# Patient Record
Sex: Female | Born: 1945 | Race: White | Hispanic: No | Smoking: Never smoker
Health system: Southern US, Community
[De-identification: ages and names within clinical notes are randomized; demographics above are authoritative.]

## PROBLEM LIST (undated history)

## (undated) DIAGNOSIS — K449 Diaphragmatic hernia without obstruction or gangrene: Secondary | ICD-10-CM

## (undated) DIAGNOSIS — I10 Essential (primary) hypertension: Secondary | ICD-10-CM

## (undated) DIAGNOSIS — R110 Nausea: Secondary | ICD-10-CM

## (undated) DIAGNOSIS — G629 Polyneuropathy, unspecified: Secondary | ICD-10-CM

## (undated) DIAGNOSIS — R51 Headache: Secondary | ICD-10-CM

## (undated) DIAGNOSIS — L0201 Cutaneous abscess of face: Secondary | ICD-10-CM

## (undated) DIAGNOSIS — K7689 Other specified diseases of liver: Secondary | ICD-10-CM

## (undated) DIAGNOSIS — F319 Bipolar disorder, unspecified: Secondary | ICD-10-CM

## (undated) DIAGNOSIS — F32A Depression, unspecified: Secondary | ICD-10-CM

## (undated) DIAGNOSIS — R519 Headache, unspecified: Secondary | ICD-10-CM

## (undated) DIAGNOSIS — Z800 Family history of malignant neoplasm of digestive organs: Secondary | ICD-10-CM

## (undated) DIAGNOSIS — G4761 Periodic limb movement disorder: Secondary | ICD-10-CM

## (undated) DIAGNOSIS — G8929 Other chronic pain: Secondary | ICD-10-CM

## (undated) DIAGNOSIS — F329 Major depressive disorder, single episode, unspecified: Secondary | ICD-10-CM

## (undated) DIAGNOSIS — R160 Hepatomegaly, not elsewhere classified: Secondary | ICD-10-CM

## (undated) DIAGNOSIS — E785 Hyperlipidemia, unspecified: Secondary | ICD-10-CM

## (undated) DIAGNOSIS — K219 Gastro-esophageal reflux disease without esophagitis: Secondary | ICD-10-CM

## (undated) DIAGNOSIS — F0390 Unspecified dementia without behavioral disturbance: Secondary | ICD-10-CM

## (undated) DIAGNOSIS — I517 Cardiomegaly: Secondary | ICD-10-CM

## (undated) DIAGNOSIS — K589 Irritable bowel syndrome without diarrhea: Secondary | ICD-10-CM

## (undated) DIAGNOSIS — K5730 Diverticulosis of large intestine without perforation or abscess without bleeding: Secondary | ICD-10-CM

## (undated) DIAGNOSIS — J449 Chronic obstructive pulmonary disease, unspecified: Secondary | ICD-10-CM

## (undated) DIAGNOSIS — M109 Gout, unspecified: Secondary | ICD-10-CM

## (undated) HISTORY — DX: Family history of malignant neoplasm of digestive organs: Z80.0

## (undated) HISTORY — PX: CHOLECYSTECTOMY: SHX55

## (undated) HISTORY — DX: Other specified diseases of liver: K76.89

## (undated) HISTORY — PX: OTHER SURGICAL HISTORY: SHX169

## (undated) HISTORY — DX: Gout, unspecified: M10.9

## (undated) HISTORY — DX: Diaphragmatic hernia without obstruction or gangrene: K44.9

## (undated) HISTORY — DX: Hyperlipidemia, unspecified: E78.5

## (undated) HISTORY — DX: Cardiomegaly: I51.7

## (undated) HISTORY — DX: Hepatomegaly, not elsewhere classified: R16.0

## (undated) HISTORY — DX: Chronic obstructive pulmonary disease, unspecified: J44.9

## (undated) HISTORY — DX: Gastro-esophageal reflux disease without esophagitis: K21.9

## (undated) HISTORY — PX: APPENDECTOMY: SHX54

## (undated) HISTORY — DX: Headache: R51

## (undated) HISTORY — DX: Depression, unspecified: F32.A

## (undated) HISTORY — PX: OVARIAN CYST REMOVAL: SHX89

## (undated) HISTORY — PX: ABDOMINAL HYSTERECTOMY: SHX81

## (undated) HISTORY — DX: Major depressive disorder, single episode, unspecified: F32.9

## (undated) HISTORY — DX: Irritable bowel syndrome, unspecified: K58.9

## (undated) HISTORY — DX: Nausea: R11.0

## (undated) HISTORY — DX: Essential (primary) hypertension: I10

## (undated) HISTORY — DX: Bipolar disorder, unspecified: F31.9

## (undated) HISTORY — DX: Diverticulosis of large intestine without perforation or abscess without bleeding: K57.30

---

## 1997-12-26 ENCOUNTER — Ambulatory Visit (HOSPITAL_COMMUNITY): Admission: RE | Admit: 1997-12-26 | Discharge: 1997-12-26 | Payer: Self-pay | Admitting: Family Medicine

## 1998-10-03 ENCOUNTER — Inpatient Hospital Stay (HOSPITAL_COMMUNITY): Admission: RE | Admit: 1998-10-03 | Discharge: 1998-10-05 | Payer: Self-pay | Admitting: *Deleted

## 2000-01-18 ENCOUNTER — Inpatient Hospital Stay (HOSPITAL_COMMUNITY): Admission: AD | Admit: 2000-01-18 | Discharge: 2000-01-25 | Payer: Self-pay | Admitting: Specialist

## 2000-03-21 ENCOUNTER — Ambulatory Visit (HOSPITAL_COMMUNITY)
Admission: RE | Admit: 2000-03-21 | Discharge: 2000-03-21 | Payer: Self-pay | Admitting: Thoracic Surgery (Cardiothoracic Vascular Surgery)

## 2000-03-21 ENCOUNTER — Encounter: Payer: Self-pay | Admitting: Thoracic Surgery (Cardiothoracic Vascular Surgery)

## 2000-03-25 ENCOUNTER — Encounter (INDEPENDENT_AMBULATORY_CARE_PROVIDER_SITE_OTHER): Payer: Self-pay | Admitting: *Deleted

## 2000-03-25 ENCOUNTER — Encounter: Payer: Self-pay | Admitting: Thoracic Surgery (Cardiothoracic Vascular Surgery)

## 2000-03-25 ENCOUNTER — Ambulatory Visit (HOSPITAL_COMMUNITY)
Admission: RE | Admit: 2000-03-25 | Discharge: 2000-03-25 | Payer: Self-pay | Admitting: Thoracic Surgery (Cardiothoracic Vascular Surgery)

## 2000-10-07 ENCOUNTER — Encounter: Payer: Self-pay | Admitting: Thoracic Surgery (Cardiothoracic Vascular Surgery)

## 2000-10-07 ENCOUNTER — Encounter
Admission: RE | Admit: 2000-10-07 | Discharge: 2000-10-07 | Payer: Self-pay | Admitting: Thoracic Surgery (Cardiothoracic Vascular Surgery)

## 2001-03-25 ENCOUNTER — Other Ambulatory Visit: Admission: RE | Admit: 2001-03-25 | Discharge: 2001-03-25 | Payer: Self-pay | Admitting: Obstetrics and Gynecology

## 2001-07-16 ENCOUNTER — Encounter (INDEPENDENT_AMBULATORY_CARE_PROVIDER_SITE_OTHER): Payer: Self-pay | Admitting: Specialist

## 2001-07-16 ENCOUNTER — Ambulatory Visit (HOSPITAL_COMMUNITY): Admission: RE | Admit: 2001-07-16 | Discharge: 2001-07-16 | Payer: Self-pay | Admitting: Gastroenterology

## 2001-09-15 ENCOUNTER — Encounter: Payer: Self-pay | Admitting: Surgery

## 2001-09-15 ENCOUNTER — Encounter (INDEPENDENT_AMBULATORY_CARE_PROVIDER_SITE_OTHER): Payer: Self-pay | Admitting: Specialist

## 2001-09-15 ENCOUNTER — Observation Stay (HOSPITAL_COMMUNITY): Admission: RE | Admit: 2001-09-15 | Discharge: 2001-09-16 | Payer: Self-pay | Admitting: Surgery

## 2001-10-03 ENCOUNTER — Encounter: Payer: Self-pay | Admitting: Emergency Medicine

## 2001-10-03 ENCOUNTER — Emergency Department (HOSPITAL_COMMUNITY): Admission: EM | Admit: 2001-10-03 | Discharge: 2001-10-03 | Payer: Self-pay | Admitting: Emergency Medicine

## 2002-03-07 ENCOUNTER — Ambulatory Visit (HOSPITAL_BASED_OUTPATIENT_CLINIC_OR_DEPARTMENT_OTHER): Admission: RE | Admit: 2002-03-07 | Discharge: 2002-03-07 | Payer: Self-pay | Admitting: Neurology

## 2002-04-02 ENCOUNTER — Other Ambulatory Visit: Admission: RE | Admit: 2002-04-02 | Discharge: 2002-04-02 | Payer: Self-pay | Admitting: *Deleted

## 2002-05-11 ENCOUNTER — Encounter: Admission: RE | Admit: 2002-05-11 | Discharge: 2002-05-11 | Payer: Self-pay | Admitting: *Deleted

## 2002-05-11 ENCOUNTER — Encounter: Payer: Self-pay | Admitting: *Deleted

## 2003-05-11 ENCOUNTER — Other Ambulatory Visit: Admission: RE | Admit: 2003-05-11 | Discharge: 2003-05-11 | Payer: Self-pay | Admitting: *Deleted

## 2003-05-15 ENCOUNTER — Emergency Department (HOSPITAL_COMMUNITY): Admission: EM | Admit: 2003-05-15 | Discharge: 2003-05-15 | Payer: Self-pay | Admitting: Emergency Medicine

## 2003-05-16 ENCOUNTER — Encounter: Admission: RE | Admit: 2003-05-16 | Discharge: 2003-05-16 | Payer: Self-pay | Admitting: Gastroenterology

## 2003-05-16 ENCOUNTER — Encounter: Payer: Self-pay | Admitting: Gastroenterology

## 2003-07-31 ENCOUNTER — Emergency Department (HOSPITAL_COMMUNITY): Admission: EM | Admit: 2003-07-31 | Discharge: 2003-07-31 | Payer: Self-pay | Admitting: Emergency Medicine

## 2004-06-29 ENCOUNTER — Emergency Department (HOSPITAL_COMMUNITY): Admission: EM | Admit: 2004-06-29 | Discharge: 2004-06-29 | Payer: Self-pay | Admitting: Family Medicine

## 2004-10-21 ENCOUNTER — Emergency Department (HOSPITAL_COMMUNITY): Admission: EM | Admit: 2004-10-21 | Discharge: 2004-10-21 | Payer: Self-pay | Admitting: Family Medicine

## 2004-11-15 ENCOUNTER — Encounter (INDEPENDENT_AMBULATORY_CARE_PROVIDER_SITE_OTHER): Payer: Self-pay | Admitting: *Deleted

## 2004-11-16 ENCOUNTER — Ambulatory Visit: Payer: Self-pay

## 2004-11-26 ENCOUNTER — Encounter (INDEPENDENT_AMBULATORY_CARE_PROVIDER_SITE_OTHER): Payer: Self-pay | Admitting: *Deleted

## 2004-12-17 ENCOUNTER — Emergency Department (HOSPITAL_COMMUNITY): Admission: EM | Admit: 2004-12-17 | Discharge: 2004-12-17 | Payer: Self-pay | Admitting: Emergency Medicine

## 2005-01-17 ENCOUNTER — Ambulatory Visit: Payer: Self-pay | Admitting: Gastroenterology

## 2005-02-13 ENCOUNTER — Encounter (INDEPENDENT_AMBULATORY_CARE_PROVIDER_SITE_OTHER): Payer: Self-pay | Admitting: Specialist

## 2005-02-13 ENCOUNTER — Ambulatory Visit: Payer: Self-pay | Admitting: Gastroenterology

## 2005-03-14 ENCOUNTER — Ambulatory Visit: Payer: Self-pay | Admitting: Gastroenterology

## 2005-05-31 ENCOUNTER — Ambulatory Visit (HOSPITAL_COMMUNITY): Admission: RE | Admit: 2005-05-31 | Discharge: 2005-05-31 | Payer: Self-pay | Admitting: Obstetrics

## 2005-11-09 ENCOUNTER — Emergency Department (HOSPITAL_COMMUNITY): Admission: EM | Admit: 2005-11-09 | Discharge: 2005-11-09 | Payer: Self-pay | Admitting: Family Medicine

## 2006-02-05 ENCOUNTER — Emergency Department (HOSPITAL_COMMUNITY): Admission: EM | Admit: 2006-02-05 | Discharge: 2006-02-06 | Payer: Self-pay | Admitting: Emergency Medicine

## 2006-04-13 ENCOUNTER — Emergency Department (HOSPITAL_COMMUNITY): Admission: EM | Admit: 2006-04-13 | Discharge: 2006-04-13 | Payer: Self-pay | Admitting: Family Medicine

## 2006-05-02 ENCOUNTER — Emergency Department (HOSPITAL_COMMUNITY): Admission: EM | Admit: 2006-05-02 | Discharge: 2006-05-02 | Payer: Self-pay | Admitting: *Deleted

## 2006-05-11 ENCOUNTER — Emergency Department (HOSPITAL_COMMUNITY): Admission: EM | Admit: 2006-05-11 | Discharge: 2006-05-11 | Payer: Self-pay | Admitting: Emergency Medicine

## 2006-07-13 ENCOUNTER — Emergency Department (HOSPITAL_COMMUNITY): Admission: EM | Admit: 2006-07-13 | Discharge: 2006-07-13 | Payer: Self-pay | Admitting: Family Medicine

## 2006-08-15 ENCOUNTER — Encounter
Admission: RE | Admit: 2006-08-15 | Discharge: 2006-08-15 | Payer: Self-pay | Admitting: Thoracic Surgery (Cardiothoracic Vascular Surgery)

## 2006-08-25 ENCOUNTER — Emergency Department (HOSPITAL_COMMUNITY): Admission: EM | Admit: 2006-08-25 | Discharge: 2006-08-25 | Payer: Self-pay | Admitting: Family Medicine

## 2006-09-20 ENCOUNTER — Emergency Department (HOSPITAL_COMMUNITY): Admission: EM | Admit: 2006-09-20 | Discharge: 2006-09-20 | Payer: Self-pay | Admitting: Emergency Medicine

## 2006-09-22 ENCOUNTER — Inpatient Hospital Stay (HOSPITAL_COMMUNITY): Admission: RE | Admit: 2006-09-22 | Discharge: 2006-09-25 | Payer: Self-pay | Admitting: Psychiatry

## 2006-09-22 ENCOUNTER — Ambulatory Visit: Payer: Self-pay | Admitting: Psychiatry

## 2006-09-26 ENCOUNTER — Ambulatory Visit: Payer: Self-pay | Admitting: Gastroenterology

## 2006-10-21 ENCOUNTER — Emergency Department (HOSPITAL_COMMUNITY): Admission: EM | Admit: 2006-10-21 | Discharge: 2006-10-21 | Payer: Self-pay | Admitting: Emergency Medicine

## 2007-01-18 ENCOUNTER — Emergency Department (HOSPITAL_COMMUNITY): Admission: EM | Admit: 2007-01-18 | Discharge: 2007-01-18 | Payer: Self-pay | Admitting: Emergency Medicine

## 2007-02-02 ENCOUNTER — Encounter
Admission: RE | Admit: 2007-02-02 | Discharge: 2007-02-02 | Payer: Self-pay | Admitting: Thoracic Surgery (Cardiothoracic Vascular Surgery)

## 2007-02-02 ENCOUNTER — Ambulatory Visit: Payer: Self-pay | Admitting: Thoracic Surgery (Cardiothoracic Vascular Surgery)

## 2007-02-03 ENCOUNTER — Ambulatory Visit: Payer: Self-pay | Admitting: Gastroenterology

## 2007-03-21 ENCOUNTER — Emergency Department (HOSPITAL_COMMUNITY): Admission: EM | Admit: 2007-03-21 | Discharge: 2007-03-21 | Payer: Self-pay | Admitting: Family Medicine

## 2007-04-09 ENCOUNTER — Ambulatory Visit: Payer: Self-pay | Admitting: Gastroenterology

## 2007-04-20 ENCOUNTER — Emergency Department (HOSPITAL_COMMUNITY): Admission: EM | Admit: 2007-04-20 | Discharge: 2007-04-20 | Payer: Self-pay | Admitting: Emergency Medicine

## 2007-04-21 ENCOUNTER — Encounter: Admission: RE | Admit: 2007-04-21 | Discharge: 2007-04-21 | Payer: Self-pay | Admitting: Family Medicine

## 2007-04-21 ENCOUNTER — Encounter (INDEPENDENT_AMBULATORY_CARE_PROVIDER_SITE_OTHER): Payer: Self-pay | Admitting: *Deleted

## 2007-06-30 ENCOUNTER — Ambulatory Visit: Payer: Self-pay | Admitting: Gastroenterology

## 2007-06-30 LAB — CONVERTED CEMR LAB
ALT: 75 units/L — ABNORMAL HIGH (ref 0–35)
AST: 44 units/L — ABNORMAL HIGH (ref 0–37)
Alkaline Phosphatase: 60 units/L (ref 39–117)
Basophils Absolute: 0 10*3/uL (ref 0.0–0.1)
Basophils Relative: 0.4 % (ref 0.0–1.0)
Bilirubin, Direct: 0.1 mg/dL (ref 0.0–0.3)
CO2: 32 meq/L (ref 19–32)
Eosinophils Absolute: 0.3 10*3/uL (ref 0.0–0.6)
Ferritin: 65.2 ng/mL (ref 10.0–291.0)
Folate: >20 ng/mL
GFR calc non Af Amer: 67 mL/min
HCT: 39.7 % (ref 36.0–46.0)
Hemoglobin: 13.9 g/dL (ref 12.0–15.0)
Iron: 67 ug/dL (ref 42–145)
Monocytes Absolute: 0.6 10*3/uL (ref 0.2–0.7)
Neutro Abs: 4.4 10*3/uL (ref 1.4–7.7)
Platelets: 347 10*3/uL (ref 150–400)
RBC: 4.55 M/uL (ref 3.87–5.11)
RDW: 12.7 % (ref 11.5–14.6)
Saturation Ratios: 17.3 % — ABNORMAL LOW (ref 20.0–50.0)
Total Bilirubin: 0.6 mg/dL (ref 0.3–1.2)
Total Protein: 7.3 g/dL (ref 6.0–8.3)
Transferrin: 276.5 mg/dL (ref >=212.0)
Vitamin B-12: 393 pg/mL (ref 211–911)

## 2007-07-24 ENCOUNTER — Encounter: Payer: Self-pay | Admitting: Gastroenterology

## 2007-08-21 ENCOUNTER — Ambulatory Visit: Payer: Self-pay | Admitting: Gastroenterology

## 2007-10-28 DIAGNOSIS — K589 Irritable bowel syndrome without diarrhea: Secondary | ICD-10-CM | POA: Insufficient documentation

## 2007-10-28 DIAGNOSIS — J4489 Other specified chronic obstructive pulmonary disease: Secondary | ICD-10-CM | POA: Insufficient documentation

## 2007-10-28 DIAGNOSIS — I10 Essential (primary) hypertension: Secondary | ICD-10-CM

## 2007-10-28 DIAGNOSIS — F319 Bipolar disorder, unspecified: Secondary | ICD-10-CM

## 2007-10-28 DIAGNOSIS — Z8679 Personal history of other diseases of the circulatory system: Secondary | ICD-10-CM | POA: Insufficient documentation

## 2007-10-28 DIAGNOSIS — J309 Allergic rhinitis, unspecified: Secondary | ICD-10-CM | POA: Insufficient documentation

## 2007-10-28 DIAGNOSIS — M1990 Unspecified osteoarthritis, unspecified site: Secondary | ICD-10-CM | POA: Insufficient documentation

## 2007-10-28 DIAGNOSIS — K7689 Other specified diseases of liver: Secondary | ICD-10-CM

## 2007-10-28 DIAGNOSIS — J449 Chronic obstructive pulmonary disease, unspecified: Secondary | ICD-10-CM

## 2007-10-28 DIAGNOSIS — R519 Headache, unspecified: Secondary | ICD-10-CM | POA: Insufficient documentation

## 2007-10-28 DIAGNOSIS — E7800 Pure hypercholesterolemia, unspecified: Secondary | ICD-10-CM | POA: Insufficient documentation

## 2007-10-28 DIAGNOSIS — K449 Diaphragmatic hernia without obstruction or gangrene: Secondary | ICD-10-CM | POA: Insufficient documentation

## 2007-10-28 DIAGNOSIS — K5730 Diverticulosis of large intestine without perforation or abscess without bleeding: Secondary | ICD-10-CM | POA: Insufficient documentation

## 2007-10-28 DIAGNOSIS — I517 Cardiomegaly: Secondary | ICD-10-CM

## 2007-10-28 DIAGNOSIS — R51 Headache: Secondary | ICD-10-CM

## 2007-11-28 ENCOUNTER — Emergency Department (HOSPITAL_COMMUNITY): Admission: EM | Admit: 2007-11-28 | Discharge: 2007-11-28 | Payer: Self-pay | Admitting: Emergency Medicine

## 2007-11-29 ENCOUNTER — Emergency Department (HOSPITAL_COMMUNITY): Admission: EM | Admit: 2007-11-29 | Discharge: 2007-11-29 | Payer: Self-pay | Admitting: Emergency Medicine

## 2007-12-22 ENCOUNTER — Ambulatory Visit: Payer: Self-pay | Admitting: Thoracic Surgery (Cardiothoracic Vascular Surgery)

## 2007-12-22 ENCOUNTER — Encounter
Admission: RE | Admit: 2007-12-22 | Discharge: 2007-12-22 | Payer: Self-pay | Admitting: Thoracic Surgery (Cardiothoracic Vascular Surgery)

## 2008-03-08 ENCOUNTER — Encounter: Admission: RE | Admit: 2008-03-08 | Discharge: 2008-03-08 | Payer: Self-pay | Admitting: Family Medicine

## 2008-03-23 ENCOUNTER — Ambulatory Visit: Payer: Self-pay | Admitting: Family Medicine

## 2008-05-02 ENCOUNTER — Ambulatory Visit: Payer: Self-pay | Admitting: Family Medicine

## 2008-05-02 DIAGNOSIS — L408 Other psoriasis: Secondary | ICD-10-CM

## 2008-05-23 ENCOUNTER — Ambulatory Visit: Payer: Self-pay | Admitting: Family Medicine

## 2008-06-24 ENCOUNTER — Ambulatory Visit: Payer: Self-pay | Admitting: Family Medicine

## 2008-08-04 ENCOUNTER — Telehealth: Payer: Self-pay | Admitting: Family Medicine

## 2008-09-17 ENCOUNTER — Ambulatory Visit: Payer: Self-pay | Admitting: Occupational Medicine

## 2008-09-17 DIAGNOSIS — J45909 Unspecified asthma, uncomplicated: Secondary | ICD-10-CM | POA: Insufficient documentation

## 2008-09-18 ENCOUNTER — Encounter: Payer: Self-pay | Admitting: Family Medicine

## 2008-09-20 ENCOUNTER — Encounter: Payer: Self-pay | Admitting: Family Medicine

## 2008-09-23 ENCOUNTER — Ambulatory Visit: Payer: Self-pay | Admitting: Family Medicine

## 2008-10-03 ENCOUNTER — Ambulatory Visit: Payer: Self-pay | Admitting: Family Medicine

## 2008-10-03 DIAGNOSIS — M25476 Effusion, unspecified foot: Secondary | ICD-10-CM

## 2008-10-03 DIAGNOSIS — M25473 Effusion, unspecified ankle: Secondary | ICD-10-CM

## 2008-10-04 ENCOUNTER — Encounter: Payer: Self-pay | Admitting: Family Medicine

## 2008-10-04 LAB — CONVERTED CEMR LAB
Basophils Absolute: 0 10*3/uL (ref 0.0–0.1)
Eosinophils Absolute: 0.4 10*3/uL (ref 0.0–0.7)
HCT: 40.5 % (ref 36.0–46.0)
Lymphocytes Relative: 30 % (ref 12–46)
MCHC: 32.1 g/dL (ref 30.0–36.0)
Monocytes Absolute: 0.7 10*3/uL (ref 0.1–1.0)
Monocytes Relative: 7 % (ref 3–12)
Neutrophils Relative %: 59 % (ref 43–77)
Platelets: 370 10*3/uL (ref 150–400)
RBC: 4.66 M/uL (ref 3.87–5.11)
RDW: 14.7 % (ref 11.5–15.5)
Sed Rate: 33 mm/hr — ABNORMAL HIGH (ref 0–22)
Uric Acid, Serum: 6.1 mg/dL (ref 2.4–7.0)

## 2008-10-13 ENCOUNTER — Ambulatory Visit: Payer: Self-pay | Admitting: Family Medicine

## 2008-10-13 ENCOUNTER — Encounter: Admission: RE | Admit: 2008-10-13 | Discharge: 2008-10-13 | Payer: Self-pay | Admitting: Family Medicine

## 2008-10-20 ENCOUNTER — Encounter: Payer: Self-pay | Admitting: Family Medicine

## 2008-10-24 ENCOUNTER — Telehealth: Payer: Self-pay | Admitting: Family Medicine

## 2008-10-31 ENCOUNTER — Encounter: Payer: Self-pay | Admitting: Family Medicine

## 2008-11-01 ENCOUNTER — Telehealth: Payer: Self-pay | Admitting: Family Medicine

## 2008-11-02 ENCOUNTER — Telehealth: Payer: Self-pay | Admitting: Family Medicine

## 2008-11-04 ENCOUNTER — Telehealth: Payer: Self-pay | Admitting: Family Medicine

## 2008-11-15 ENCOUNTER — Telehealth (INDEPENDENT_AMBULATORY_CARE_PROVIDER_SITE_OTHER): Payer: Self-pay | Admitting: *Deleted

## 2008-11-24 ENCOUNTER — Telehealth: Payer: Self-pay | Admitting: Family Medicine

## 2008-11-25 ENCOUNTER — Telehealth: Payer: Self-pay | Admitting: Family Medicine

## 2008-11-29 ENCOUNTER — Encounter: Payer: Self-pay | Admitting: Family Medicine

## 2008-12-22 ENCOUNTER — Encounter: Payer: Self-pay | Admitting: Family Medicine

## 2009-01-03 ENCOUNTER — Ambulatory Visit: Payer: Self-pay | Admitting: Family Medicine

## 2009-01-03 LAB — CONVERTED CEMR LAB: Hgb A1c MFr Bld: 5.8 %

## 2009-01-04 ENCOUNTER — Encounter: Payer: Self-pay | Admitting: Family Medicine

## 2009-01-05 LAB — CONVERTED CEMR LAB
AST: 31 units/L (ref 0–37)
BUN: 15 mg/dL (ref 6–23)
Basophils Relative: 1 % (ref 0–1)
CO2: 26 meq/L (ref 19–32)
Calcium: 9.7 mg/dL (ref 8.4–10.5)
Chloride: 98 meq/L (ref 96–112)
Eosinophils Relative: 5 % (ref 0–5)
Glucose, Bld: 95 mg/dL (ref 70–99)
HCT: 44.2 % (ref 36.0–46.0)
Lymphs Abs: 2.8 10*3/uL (ref 0.7–4.0)
MCHC: 32.4 g/dL (ref 30.0–36.0)
Monocytes Relative: 6 % (ref 3–12)
Platelets: 320 10*3/uL (ref 150–400)
RDW: 14.9 % (ref 11.5–15.5)
TSH: 2.45 microintl units/mL (ref 0.350–4.500)
Total Bilirubin: 0.4 mg/dL (ref 0.3–1.2)
WBC: 7 10*3/uL (ref 4.0–10.5)

## 2009-01-09 ENCOUNTER — Telehealth: Payer: Self-pay | Admitting: Family Medicine

## 2009-01-10 ENCOUNTER — Telehealth (INDEPENDENT_AMBULATORY_CARE_PROVIDER_SITE_OTHER): Payer: Self-pay | Admitting: *Deleted

## 2009-01-11 ENCOUNTER — Encounter: Payer: Self-pay | Admitting: Family Medicine

## 2009-01-11 LAB — CONVERTED CEMR LAB
AST: 26 units/L (ref 0–37)
Indirect Bilirubin: 0.3 mg/dL (ref 0.0–0.9)
Total Bilirubin: 0.4 mg/dL (ref 0.3–1.2)

## 2009-01-26 ENCOUNTER — Ambulatory Visit: Payer: Self-pay | Admitting: Family Medicine

## 2009-01-26 DIAGNOSIS — G609 Hereditary and idiopathic neuropathy, unspecified: Secondary | ICD-10-CM | POA: Insufficient documentation

## 2009-01-27 ENCOUNTER — Encounter: Payer: Self-pay | Admitting: Family Medicine

## 2009-01-27 LAB — CONVERTED CEMR LAB: Vit D, 25-Hydroxy: 21 ng/mL — ABNORMAL LOW (ref 30–89)

## 2009-02-02 ENCOUNTER — Telehealth: Payer: Self-pay | Admitting: Family Medicine

## 2009-02-20 ENCOUNTER — Ambulatory Visit: Payer: Self-pay | Admitting: Family Medicine

## 2009-02-21 ENCOUNTER — Encounter: Payer: Self-pay | Admitting: Family Medicine

## 2009-02-25 ENCOUNTER — Encounter: Payer: Self-pay | Admitting: Family Medicine

## 2009-02-28 ENCOUNTER — Encounter: Payer: Self-pay | Admitting: Family Medicine

## 2009-02-28 LAB — CONVERTED CEMR LAB
ALT: 49 units/L — ABNORMAL HIGH (ref 0–35)
AST: 35 units/L (ref 0–37)
Alkaline Phosphatase: 58 units/L (ref 39–117)
Amylase: 28 units/L (ref 0–105)
BUN: 19 mg/dL (ref 6–23)
CO2: 24 meq/L (ref 19–32)
Creatinine, Ser: 0.82 mg/dL (ref 0.40–1.20)
Glucose, Bld: 100 mg/dL — ABNORMAL HIGH (ref 70–99)
Potassium: 4.2 meq/L (ref 3.5–5.3)
Sodium: 138 meq/L (ref 135–145)
Total Protein: 7.3 g/dL (ref 6.0–8.3)

## 2009-03-01 ENCOUNTER — Encounter: Admission: RE | Admit: 2009-03-01 | Discharge: 2009-03-01 | Payer: Self-pay | Admitting: Family Medicine

## 2009-03-01 ENCOUNTER — Telehealth: Payer: Self-pay | Admitting: Family Medicine

## 2009-03-01 ENCOUNTER — Encounter: Payer: Self-pay | Admitting: Family Medicine

## 2009-03-01 DIAGNOSIS — R160 Hepatomegaly, not elsewhere classified: Secondary | ICD-10-CM | POA: Insufficient documentation

## 2009-03-01 DIAGNOSIS — K7689 Other specified diseases of liver: Secondary | ICD-10-CM | POA: Insufficient documentation

## 2009-03-01 LAB — CONVERTED CEMR LAB
HCV Ab: NEGATIVE
Hep A IgM: NEGATIVE
Hepatitis B Surface Ag: NEGATIVE

## 2009-03-03 ENCOUNTER — Encounter: Admission: RE | Admit: 2009-03-03 | Discharge: 2009-03-03 | Payer: Self-pay | Admitting: Family Medicine

## 2009-03-03 ENCOUNTER — Telehealth: Payer: Self-pay | Admitting: Family Medicine

## 2009-03-07 ENCOUNTER — Ambulatory Visit: Payer: Self-pay | Admitting: Family Medicine

## 2009-03-23 ENCOUNTER — Telehealth: Payer: Self-pay | Admitting: Family Medicine

## 2009-03-24 ENCOUNTER — Telehealth: Payer: Self-pay | Admitting: Family Medicine

## 2009-03-30 ENCOUNTER — Telehealth (INDEPENDENT_AMBULATORY_CARE_PROVIDER_SITE_OTHER): Payer: Self-pay | Admitting: *Deleted

## 2009-04-03 ENCOUNTER — Ambulatory Visit: Payer: Self-pay | Admitting: Gastroenterology

## 2009-04-07 ENCOUNTER — Telehealth: Payer: Self-pay | Admitting: Family Medicine

## 2009-04-11 ENCOUNTER — Ambulatory Visit: Payer: Self-pay | Admitting: Family Medicine

## 2009-04-12 ENCOUNTER — Encounter: Payer: Self-pay | Admitting: Family Medicine

## 2009-04-12 ENCOUNTER — Telehealth: Payer: Self-pay | Admitting: Family Medicine

## 2009-04-19 ENCOUNTER — Ambulatory Visit (HOSPITAL_COMMUNITY): Admission: RE | Admit: 2009-04-19 | Discharge: 2009-04-19 | Payer: Self-pay | Admitting: Gastroenterology

## 2009-04-20 ENCOUNTER — Telehealth: Payer: Self-pay | Admitting: Gastroenterology

## 2009-04-20 ENCOUNTER — Telehealth (INDEPENDENT_AMBULATORY_CARE_PROVIDER_SITE_OTHER): Payer: Self-pay | Admitting: *Deleted

## 2009-04-21 ENCOUNTER — Telehealth: Payer: Self-pay | Admitting: Gastroenterology

## 2009-04-24 ENCOUNTER — Ambulatory Visit: Payer: Self-pay | Admitting: Gastroenterology

## 2009-04-24 ENCOUNTER — Telehealth: Payer: Self-pay | Admitting: Gastroenterology

## 2009-05-01 ENCOUNTER — Encounter: Payer: Self-pay | Admitting: Gastroenterology

## 2009-05-01 ENCOUNTER — Ambulatory Visit: Payer: Self-pay | Admitting: Gastroenterology

## 2009-05-02 ENCOUNTER — Telehealth: Payer: Self-pay | Admitting: Gastroenterology

## 2009-05-02 ENCOUNTER — Encounter: Payer: Self-pay | Admitting: Gastroenterology

## 2009-05-03 ENCOUNTER — Telehealth: Payer: Self-pay | Admitting: Gastroenterology

## 2009-05-22 ENCOUNTER — Telehealth: Payer: Self-pay | Admitting: Gastroenterology

## 2009-05-23 ENCOUNTER — Telehealth: Payer: Self-pay | Admitting: Family Medicine

## 2009-06-03 ENCOUNTER — Ambulatory Visit: Payer: Self-pay | Admitting: Family Medicine

## 2009-06-15 ENCOUNTER — Ambulatory Visit: Payer: Self-pay | Admitting: Family Medicine

## 2009-06-29 ENCOUNTER — Encounter: Payer: Self-pay | Admitting: Family Medicine

## 2009-07-07 ENCOUNTER — Ambulatory Visit: Payer: Self-pay | Admitting: Family Medicine

## 2009-07-07 LAB — CONVERTED CEMR LAB
Bilirubin Urine: NEGATIVE
Blood Glucose, Fingerstick: 138
Glucose, Urine, Semiquant: NEGATIVE
Nitrite: NEGATIVE
Urobilinogen, UA: 0.2

## 2009-07-08 ENCOUNTER — Encounter: Payer: Self-pay | Admitting: Family Medicine

## 2009-07-09 ENCOUNTER — Encounter: Payer: Self-pay | Admitting: Family Medicine

## 2009-07-11 LAB — CONVERTED CEMR LAB
ALT: 78 units/L — ABNORMAL HIGH (ref 0–35)
Albumin: 4.6 g/dL (ref 3.5–5.2)
CO2: 28 meq/L (ref 19–32)
Calcium: 9.7 mg/dL (ref 8.4–10.5)
Chloride: 94 meq/L — ABNORMAL LOW (ref 96–112)
Hemoglobin: 14.2 g/dL (ref 12.0–15.0)
Hgb A1c MFr Bld: 5.6 % (ref 4.6–6.1)
Lymphocytes Relative: 33 % (ref 12–46)
Lymphs Abs: 2.8 10*3/uL (ref 0.7–4.0)
Monocytes Relative: 8 % (ref 3–12)
Neutro Abs: 4.6 10*3/uL (ref 1.7–7.7)
Neutrophils Relative %: 54 % (ref 43–77)
Potassium: 4.4 meq/L (ref 3.5–5.3)
RBC: 4.81 M/uL (ref 3.87–5.11)
Sodium: 138 meq/L (ref 135–145)
Total Bilirubin: 0.5 mg/dL (ref 0.3–1.2)
Total Protein: 7.3 g/dL (ref 6.0–8.3)
WBC: 8.5 10*3/uL (ref 4.0–10.5)

## 2009-07-19 ENCOUNTER — Encounter: Payer: Self-pay | Admitting: Family Medicine

## 2009-07-20 ENCOUNTER — Encounter: Payer: Self-pay | Admitting: Family Medicine

## 2009-08-03 ENCOUNTER — Encounter: Payer: Self-pay | Admitting: Family Medicine

## 2009-08-05 ENCOUNTER — Encounter: Payer: Self-pay | Admitting: Family Medicine

## 2009-08-21 ENCOUNTER — Ambulatory Visit: Payer: Self-pay | Admitting: Occupational Medicine

## 2009-09-04 ENCOUNTER — Ambulatory Visit: Payer: Self-pay | Admitting: Occupational Medicine

## 2009-09-13 ENCOUNTER — Encounter: Admission: RE | Admit: 2009-09-13 | Discharge: 2009-09-13 | Payer: Self-pay | Admitting: Family Medicine

## 2009-09-13 ENCOUNTER — Ambulatory Visit: Payer: Self-pay | Admitting: Family Medicine

## 2009-09-14 ENCOUNTER — Encounter: Payer: Self-pay | Admitting: Family Medicine

## 2009-09-14 LAB — CONVERTED CEMR LAB
Albumin: 4.6 g/dL (ref 3.5–5.2)
BUN: 13 mg/dL (ref 6–23)
Basophils Absolute: 0 10*3/uL (ref 0.0–0.1)
Calcium: 9.5 mg/dL (ref 8.4–10.5)
Chloride: 100 meq/L (ref 96–112)
Eosinophils Relative: 4 % (ref 0–5)
Glucose, Bld: 97 mg/dL (ref 70–99)
HCT: 42.7 % (ref 36.0–46.0)
Hemoglobin: 14 g/dL (ref 12.0–15.0)
Lymphocytes Relative: 41 % (ref 12–46)
Monocytes Absolute: 0.6 10*3/uL (ref 0.1–1.0)
Potassium: 4.6 meq/L (ref 3.5–5.3)
RDW: 13.8 % (ref 11.5–15.5)
Saturation Ratios: 28 % (ref 20–55)
TSH: 1.384 microintl units/mL (ref 0.350–4.500)
Vit D, 25-Hydroxy: 33 ng/mL (ref 30–89)
Vitamin B-12: 666 pg/mL (ref 211–911)

## 2009-09-28 ENCOUNTER — Encounter: Payer: Self-pay | Admitting: Family Medicine

## 2009-10-03 ENCOUNTER — Ambulatory Visit: Payer: Self-pay | Admitting: Family Medicine

## 2009-10-09 ENCOUNTER — Telehealth: Payer: Self-pay | Admitting: Family Medicine

## 2009-10-20 ENCOUNTER — Telehealth: Payer: Self-pay | Admitting: Family Medicine

## 2009-10-24 ENCOUNTER — Telehealth: Payer: Self-pay | Admitting: Family Medicine

## 2009-10-30 ENCOUNTER — Ambulatory Visit: Payer: Self-pay | Admitting: Family Medicine

## 2009-11-01 ENCOUNTER — Telehealth: Payer: Self-pay | Admitting: Family Medicine

## 2009-11-02 ENCOUNTER — Encounter: Payer: Self-pay | Admitting: Family Medicine

## 2009-11-03 LAB — CONVERTED CEMR LAB
Albumin: 4.6 g/dL (ref 3.5–5.2)
Alkaline Phosphatase: 52 units/L (ref 39–117)
Amylase: 26 units/L (ref 0–105)
BUN: 13 mg/dL (ref 6–23)
Basophils Absolute: 0 10*3/uL (ref 0.0–0.1)
Basophils Relative: 1 % (ref 0–1)
Eosinophils Relative: 6 % — ABNORMAL HIGH (ref 0–5)
Glucose, Bld: 106 mg/dL — ABNORMAL HIGH (ref 70–99)
HCT: 44.4 % (ref 36.0–46.0)
Hemoglobin: 14.3 g/dL (ref 12.0–15.0)
Lipase: 13 units/L (ref 0–75)
MCHC: 32.2 g/dL (ref 30.0–36.0)
Monocytes Absolute: 0.5 10*3/uL (ref 0.1–1.0)
Potassium: 4.1 meq/L (ref 3.5–5.3)
RDW: 14.4 % (ref 11.5–15.5)
Total Bilirubin: 0.6 mg/dL (ref 0.3–1.2)

## 2009-11-20 ENCOUNTER — Telehealth: Payer: Self-pay | Admitting: Family Medicine

## 2009-11-24 ENCOUNTER — Ambulatory Visit: Payer: Self-pay | Admitting: Family Medicine

## 2009-11-24 DIAGNOSIS — J984 Other disorders of lung: Secondary | ICD-10-CM

## 2009-11-30 ENCOUNTER — Ambulatory Visit: Payer: Self-pay | Admitting: Family Medicine

## 2009-11-30 ENCOUNTER — Encounter: Admission: RE | Admit: 2009-11-30 | Discharge: 2009-11-30 | Payer: Self-pay | Admitting: Family Medicine

## 2009-12-06 ENCOUNTER — Telehealth (INDEPENDENT_AMBULATORY_CARE_PROVIDER_SITE_OTHER): Payer: Self-pay | Admitting: *Deleted

## 2010-01-04 ENCOUNTER — Encounter: Payer: Self-pay | Admitting: Family Medicine

## 2010-02-01 ENCOUNTER — Encounter: Payer: Self-pay | Admitting: Family Medicine

## 2010-02-01 ENCOUNTER — Ambulatory Visit: Payer: Self-pay | Admitting: Family

## 2010-02-02 LAB — CONVERTED CEMR LAB
Alkaline Phosphatase: 52 units/L (ref 39–117)
Bilirubin, Direct: 0.1 mg/dL (ref 0.0–0.3)
HCT: 45.7 % (ref 36.0–46.0)
Indirect Bilirubin: 0.6 mg/dL (ref 0.0–0.9)
Platelets: 326 10*3/uL (ref 150–400)
Total Protein: 7.8 g/dL (ref 6.0–8.3)
WBC: 6.6 10*3/uL (ref 4.0–10.5)

## 2010-02-05 ENCOUNTER — Telehealth: Payer: Self-pay | Admitting: Family

## 2010-02-15 ENCOUNTER — Telehealth: Payer: Self-pay | Admitting: Family

## 2010-02-21 ENCOUNTER — Encounter: Payer: Self-pay | Admitting: Family Medicine

## 2010-02-28 ENCOUNTER — Encounter: Payer: Self-pay | Admitting: Family Medicine

## 2010-03-12 ENCOUNTER — Encounter: Payer: Self-pay | Admitting: Family Medicine

## 2010-03-15 ENCOUNTER — Encounter: Payer: Self-pay | Admitting: Family Medicine

## 2010-03-24 ENCOUNTER — Ambulatory Visit: Payer: Self-pay | Admitting: Emergency Medicine

## 2010-03-24 DIAGNOSIS — E785 Hyperlipidemia, unspecified: Secondary | ICD-10-CM

## 2010-04-10 ENCOUNTER — Ambulatory Visit: Payer: Self-pay | Admitting: Family Medicine

## 2010-04-10 ENCOUNTER — Telehealth: Payer: Self-pay | Admitting: Family Medicine

## 2010-04-10 LAB — CONVERTED CEMR LAB
Bilirubin Urine: NEGATIVE
Glucose, Urine, Semiquant: NEGATIVE
Ketones, urine, test strip: NEGATIVE
Nitrite: NEGATIVE
WBC Urine, dipstick: NEGATIVE

## 2010-04-11 ENCOUNTER — Encounter: Payer: Self-pay | Admitting: Family Medicine

## 2010-04-11 DIAGNOSIS — E871 Hypo-osmolality and hyponatremia: Secondary | ICD-10-CM

## 2010-04-11 LAB — CONVERTED CEMR LAB
ALT: 48 units/L — ABNORMAL HIGH (ref 0–35)
AST: 39 units/L — ABNORMAL HIGH (ref 0–37)
CO2: 24 meq/L (ref 19–32)
Calcium: 9.7 mg/dL (ref 8.4–10.5)
Chloride: 83 meq/L — ABNORMAL LOW (ref 96–112)
Potassium: 4.3 meq/L (ref 3.5–5.3)
Sodium: 123 meq/L — ABNORMAL LOW (ref 135–145)
TSH: 1.951 microintl units/mL (ref 0.350–4.500)
Total Protein: 7.4 g/dL (ref 6.0–8.3)

## 2010-04-13 ENCOUNTER — Telehealth: Payer: Self-pay | Admitting: Family Medicine

## 2010-04-13 ENCOUNTER — Encounter: Payer: Self-pay | Admitting: Family Medicine

## 2010-04-17 ENCOUNTER — Encounter: Payer: Self-pay | Admitting: Family Medicine

## 2010-04-17 LAB — CONVERTED CEMR LAB
Osmolality, Ur: 280 mOsm/kg — ABNORMAL LOW (ref 390–1090)
Osmolality: 275 mOsm/kg (ref 275–300)
Sodium, 24H Ur: 13 mmol/L — ABNORMAL LOW (ref 40–220)
Sodium: 130 meq/L — ABNORMAL LOW (ref 135–145)

## 2010-04-20 ENCOUNTER — Encounter: Payer: Self-pay | Admitting: Family Medicine

## 2010-04-20 ENCOUNTER — Telehealth (INDEPENDENT_AMBULATORY_CARE_PROVIDER_SITE_OTHER): Payer: Self-pay | Admitting: *Deleted

## 2010-04-20 LAB — CONVERTED CEMR LAB: Sodium: 136 meq/L (ref 135–145)

## 2010-04-23 ENCOUNTER — Ambulatory Visit: Payer: Self-pay | Admitting: Family Medicine

## 2010-04-23 LAB — CONVERTED CEMR LAB
Bilirubin Urine: NEGATIVE
Blood in Urine, dipstick: NEGATIVE
Glucose, Urine, Semiquant: NEGATIVE
Nitrite: NEGATIVE
Specific Gravity, Urine: >=1.03
Urobilinogen, UA: 0.2
pH: 5.5

## 2010-04-26 ENCOUNTER — Telehealth: Payer: Self-pay | Admitting: Family Medicine

## 2010-04-27 ENCOUNTER — Encounter: Payer: Self-pay | Admitting: Family Medicine

## 2010-04-27 LAB — CONVERTED CEMR LAB
BUN: 9 mg/dL (ref 6–23)
Chloride: 102 meq/L (ref 96–112)
Creatinine, Ser: 1 mg/dL (ref 0.40–1.20)
Glucose, Bld: 133 mg/dL — ABNORMAL HIGH (ref 70–99)
Potassium: 4.4 meq/L (ref 3.5–5.3)

## 2010-04-30 ENCOUNTER — Telehealth: Payer: Self-pay | Admitting: Family Medicine

## 2010-05-02 ENCOUNTER — Encounter: Payer: Self-pay | Admitting: Family Medicine

## 2010-05-24 ENCOUNTER — Encounter (INDEPENDENT_AMBULATORY_CARE_PROVIDER_SITE_OTHER): Payer: Self-pay | Admitting: *Deleted

## 2010-05-30 ENCOUNTER — Telehealth: Payer: Self-pay | Admitting: Family Medicine

## 2010-05-31 ENCOUNTER — Telehealth: Payer: Self-pay | Admitting: Family Medicine

## 2010-06-01 ENCOUNTER — Encounter: Payer: Self-pay | Admitting: Family Medicine

## 2010-06-01 DIAGNOSIS — R7309 Other abnormal glucose: Secondary | ICD-10-CM

## 2010-06-03 LAB — CONVERTED CEMR LAB: Hgb A1c MFr Bld: 5.5 % (ref <5.7)

## 2010-06-07 ENCOUNTER — Telehealth: Payer: Self-pay | Admitting: Family Medicine

## 2010-06-08 ENCOUNTER — Encounter: Payer: Self-pay | Admitting: Family Medicine

## 2010-06-08 ENCOUNTER — Telehealth: Payer: Self-pay | Admitting: Family Medicine

## 2010-06-12 ENCOUNTER — Telehealth: Payer: Self-pay | Admitting: Family Medicine

## 2010-06-14 ENCOUNTER — Telehealth: Payer: Self-pay | Admitting: Family Medicine

## 2010-06-19 ENCOUNTER — Encounter: Payer: Self-pay | Admitting: Family Medicine

## 2010-07-04 ENCOUNTER — Encounter: Payer: Self-pay | Admitting: Family Medicine

## 2010-07-16 ENCOUNTER — Telehealth: Payer: Self-pay | Admitting: Family Medicine

## 2010-08-08 DIAGNOSIS — D599 Acquired hemolytic anemia, unspecified: Secondary | ICD-10-CM | POA: Insufficient documentation

## 2010-08-14 ENCOUNTER — Telehealth: Payer: Self-pay | Admitting: Family Medicine

## 2010-08-29 ENCOUNTER — Ambulatory Visit
Admission: RE | Admit: 2010-08-29 | Discharge: 2010-08-29 | Payer: Self-pay | Source: Home / Self Care | Admitting: Emergency Medicine

## 2010-09-05 ENCOUNTER — Ambulatory Visit
Admission: RE | Admit: 2010-09-05 | Discharge: 2010-09-05 | Payer: Self-pay | Source: Home / Self Care | Attending: Family Medicine | Admitting: Family Medicine

## 2010-09-23 ENCOUNTER — Encounter: Payer: Self-pay | Admitting: Neurology

## 2010-09-30 LAB — CONVERTED CEMR LAB
HDL: 57 mg/dL
LDL Cholesterol: 262 mg/dL

## 2010-10-02 NOTE — Miscellaneous (Signed)
Summary: Immunization Entry   Immunization History:  Influenza Immunization History:    Influenza:  historical (05/24/2010) 

## 2010-10-02 NOTE — Miscellaneous (Signed)
Summary: CT abd ordered  Clinical Lists Changes  Problems: Added new problem of ABDOMINAL ULTRASOUND, ABNORMAL (ICD-793.6) Orders: Added new Test order of T-CT Abdomen w/contrast (57846) - Signed

## 2010-10-02 NOTE — Assessment & Plan Note (Signed)
Summary: SINUS INFEC/TM   Vital Signs:  Patient Profile:   65 Years Old Female CC:      ? sinus infection X 1 week Height:     64 inches Weight:      224 pounds O2 Sat:      96 % O2 treatment:    Room Air Temp:     96.6 degrees F oral Pulse rate:   73 / minute Resp:     18 per minute BP sitting:   128 / 85  (right arm) Cuff size:   large  Pt. in pain?   no  Vitals Entered By: Lajean Saver RN (March 24, 2010 3:59 PM)                   Updated Prior Medication List: TOPROL XL 100 MG XR24H-TAB (METOPROLOL SUCCINATE) Take 1 tablet by mouth once a day LOVAZA 1 GM CAPS (OMEGA-3-ACID ETHYL ESTERS) 4 tabs by mouth daily MAGNESIUM OXIDE 400 MG TABS (MAGNESIUM OXIDE) 2 tabs by mouth once a day * RED YEST RICE 2 capsules a day LUNESTA 3 MG TABS (ESZOPICLONE) one tablet by mouth at at bedtime FLUNISOLIDE 0.025 % SOLN (FLUNISOLIDE) 2 sprays in each nostril daily TRIAMTERENE-HCTZ 37.5-25 MG TABS (TRIAMTERENE-HCTZ) Take 1 tablet by mouth once a day RANITIDINE HCL 150 MG TABS (RANITIDINE HCL) Take 1 tablet by mouth two times a day COMBIVENT 18-103 MCG/ACT AERO (IPRATROPIUM-ALBUTEROL) 1 puff inhaled QID CLOBETASOL PROPIONATE 0.05 % CREA (CLOBETASOL PROPIONATE) Applhy once daily to affected area. * COSAMIN ASU joint health supplement. EPIPEN 2-PAK 0.3 MG/0.3ML DEVI (EPINEPHRINE) 1 injection [place] as needed allergic reaction or beestin. NEXIUM 40 MG CPDR (ESOMEPRAZOLE MAGNESIUM) Take 1 capsule by mouth once a day PERPHENAZINE 2 MG TABS (PERPHENAZINE) once daily  Current Allergies (reviewed today): ! BENTYL ! * ANTISPASMATICS ! * BEE STINGS OMEPRAZOLE (OMEPRAZOLE) WELLBUTRIN (BUPROPION HCL)History of Present Illness Chief Complaint: ? sinus infection X 1 week History of Present Illness: Sinus infection for a week.  Recently had dental work and was placed on a Zpak for that, but since that ended, the pain has become worse.  Bilateral sinus congestion, drainage, tenderness, pain.  No fever.   H/o sinus infections in the past. Not taking any OTC meds.  REVIEW OF SYSTEMS Constitutional Symptoms      Denies fever, chills, night sweats, weight loss, weight gain, and fatigue.  Eyes       Denies change in vision, eye pain, eye discharge, glasses, contact lenses, and eye surgery. Ear/Nose/Throat/Mouth       Complains of frequent runny nose and sinus problems.      Denies hearing loss/aids, change in hearing, ear pain, ear discharge, dizziness, frequent nose bleeds, sore throat, hoarseness, and tooth pain or bleeding.  Respiratory       Denies dry cough, productive cough, wheezing, shortness of breath, asthma, bronchitis, and emphysema/COPD.  Cardiovascular       Denies murmurs, chest pain, and tires easily with exhertion.    Gastrointestinal       Denies stomach pain, nausea/vomiting, diarrhea, constipation, blood in bowel movements, and indigestion. Genitourniary       Denies painful urination, kidney stones, and loss of urinary control. Neurological       Complains of headaches.      Denies paralysis, seizures, and fainting/blackouts. Musculoskeletal       Denies muscle pain, joint pain, joint stiffness, decreased range of motion, redness, swelling, muscle weakness, and gout.  Skin  Denies bruising, unusual mles/lumps or sores, and hair/skin or nail changes.  Psych       Denies mood changes, temper/anger issues, anxiety/stress, speech problems, depression, and sleep problems.  Past History:  Past Medical History: BIPOLAR DISORDER UNSPECIFIED (ICD-296.80)-- [name], NP DEPRESSION (ICD-311) -Sees [name].   Hyperlipidemia Hypertension  Past Surgical History: Reviewed history from 10/28/2007 and no changes required. cholecystectomy partial hysterectomy ovarian cystectomy removal of cyst on chest wall  Family History: Reviewed history from 04/03/2009 and no changes required. mother deceased - emphysema father deceased - heart blockage one brother alive  and healthy one brother alive and altzheimers Family History of Colon Cancer:Maternal Grandfather   Social History: Reviewed history from 03/23/2008 and no changes required. Retired day Occupational hygienist. Widowed x 1 yr.  Has 5 kids - 1 boy, 4 girls.  Youngest lives w/ her. Never smoked. Denies ETOH. Exercises 3 x a wk. G6P5 Physical Exam General appearance: well developed, well nourished, no acute distress Head: bilateral sinus tenderness Ears: normal, no lesions or deformities Nasal: mucosa pink, nonedematous, no septal deviation, turbinates normal Oral/Pharynx: tongue normal, posterior pharynx without erythema or exudate.  No dental abscess seen Chest/Lungs: no rales, wheezes, or rhonchi bilateral, breath sounds equal without effort Heart: regular rate and  rhythm, no murmur Assessment New Problems: ACUTE MAXILLARY SINUSITIS (ICD-461.0) HYPERLIPIDEMIA (ICD-272.4)   Plan New Medications/Changes: AUGMENTIN 875-125 MG TABS (AMOXICILLIN-POT CLAVULANATE) 1 tab by mouth two times a day for 14 days  #28 x 0, 03/24/2010, Hoyt Koch MD  New Orders: New Patient Level III [phone]  The patient and/or caregiver has been counseled thoroughly with regard to medications prescribed including dosage, schedule, interactions, rationale for use, and possible side effects and they verbalize understanding.  Diagnoses and expected course of recovery discussed and will return if not improved as expected or if the condition worsens. Patient and/or caregiver verbalized understanding.  Prescriptions: AUGMENTIN 875-125 MG TABS (AMOXICILLIN-POT CLAVULANATE) 1 tab by mouth two times a day for 14 days  #28 x 0   Entered and Authorized by:   Hoyt Koch MD   Signed by:   Hoyt Koch MD on 03/24/2010   Method used:   Printed then faxed [facility] ...       [facility]  [place] [phone]       [address] [place], [place]  [address]       Ph: [phone] or [phone]       Fax:  [phone]   RxID:   [id number]   Patient Instructions: 1)  Hydration, OTC decongestant / antihistamine, nasal saline 2)  Follow-up with your primary care physician if not improving in a week 3)  If further dental pain, f/u with your dentist  Orders Added: 1)  New Patient Level III [phone]

## 2010-10-02 NOTE — Letter (Signed)
Summary: PFT Results  PFT Results   Imported By: Junius Finner 09/04/2009 18:31:33  _____________________________________________________________________  External Attachment:    Type:   Image     Comment:   External Document

## 2010-10-02 NOTE — Consult Note (Signed)
Summary: [facility]  [facility]   Imported By: Lanelle Bal 05/11/2010 12:45:07  _____________________________________________________________________  External Attachment:    Type:   Image     Comment:   External Document

## 2010-10-02 NOTE — Letter (Signed)
Summary: Out of Work  [facility]  [address], [address]   [place], [place] [address]   Phone: [phone]  Fax: [phone]    October 30, 2009   Employee:  [employer] [facility]    To Whom It May Concern:   For Medical reasons, please excuse the above named employee from work for the following dates:  Start:   10-30-2009  End:   11-01-2009  If you need additional information, please feel free to contact our office.         Sincerely,    [name] MD

## 2010-10-02 NOTE — Letter (Signed)
Summary: [facility]  [facility]   Imported By: Lanelle Bal 10/18/2009 09:18:56  _____________________________________________________________________  External Attachment:    Type:   Image     Comment:   External Document

## 2010-10-02 NOTE — Progress Notes (Signed)
Summary: Refill  Phone Note Call from Patient Call back at [facility] Phone [phone]   Summary of Call: Pt would like the [facility]obetasol [facility] [address]  Initial call taken by: Kathlene November,  October 24, 2009 10:38 AM  Follow-up for Phone Call        Uses on rash area cuased by her EMSAM patch.  Follow-up by: [name] MD,  October 24, 2009 11:22 AM    New/Updated Medications: CLOBETASOL PROPIONATE 0.05 % CREA (CLOBETASOL PROPIONATE) Applhy once daily to affected area. Prescriptions: CLOBETASOL PROPIONATE 0.05 % CREA (CLOBETASOL PROPIONATE) Applhy once daily to affected area.  #20 grams. x 0   Entered and Authorized by:   [name] MD   Signed by:   [name] MD on 10/24/2009   Method used:   [facility]ronically to        [facility] [phone]* (retail)       [address] [place], [place]  [address]       Ph: [phone] or [phone]       Fax: [phone]   RxID:   [id number]

## 2010-10-02 NOTE — Progress Notes (Signed)
Summary: meds  Phone Note Call from Patient   Summary of Call: pt started tryliptall June 9 Initial call taken by: Avon Gully CMA, Duncan Dull),  April 10, 2010 2:49 PM

## 2010-10-02 NOTE — Progress Notes (Signed)
Summary: Order for supplies for nebulizer  Phone Note Call from Patient   Caller: Patient Call For: [name] MD Summary of Call: Pt has nebulizer machine from [facility] and they need order faxed to them for her supplies- tubing, and container that holds the med, etc. Fax order to [phone]. Send demographics as well Initial call taken by: Kathlene November LPN,  June 07, 2010 8:58 AM  Follow-up for Phone Call        Have [facility] send me order for exactly what they need, tubing etc. I don't want to guess adn then have to rewrite it.  Follow-up by: [name] MD,  June 07, 2010 12:43 PM  Additional Follow-up for Phone Call Additional follow up Details #1::        Called and spoke with some one at [facility] and they did not see pt in their system.they state if the pt is just needing supplies for her machine then she can come to the facility and pick some up.Called and notified pt Additional Follow-up by: Avon Gully CMA, Duncan Dull),  June 08, 2010 11:42 AM

## 2010-10-02 NOTE — Assessment & Plan Note (Signed)
Summary: Nausea worse   Vital Signs:  Patient profile:   65 year old female Height:      64 inches Weight:      219 pounds Pulse rate:   62 / minute BP sitting:   127 / 77  (left arm) Cuff size:   large  Vitals Entered By: Avon Gully CMA, (AAMA) (April 10, 2010 1:00 PM) CC: hot flashes x 1 month   Primary Care Provider:  [name], MD  CC:  hot flashes x 1 month.  History of Present Illness: Has tried sevearl medications for nausea. Has tried phenergan, odansetron, prochlorperazine and perphenazine. None of these make her feel better. No pain or cramping. Skin has been very sensitive. No hair changes.  Loran Senters has worsened in the last 2 weeks.  No dysuria or hematuria.  Loss of appetite. Uses MOM to have a BM. Pepto bismol not helping.  Last colonoscopy was 7 years ago. Denies any othe associated symptoms.     Hot flashes started about  month ago . Last period was 10 years ago. Having them during the daytime and night.    Thinks the nausea started before the trileptal. Went up on her dose trileptal dose  to 600mg  two times a day about 2 weeks ago.  Started the medicine June 9th.    Current Medications (verified): 1)  Toprol Xl 100 Mg Xr24h-Tab (Metoprolol Succinate) .... Take 1 Tablet By Mouth Once A Day 2)  Lovaza 1 Gm Caps (Omega-3-Acid Ethyl Esters) .... 4 Tabs By Mouth Daily 3)  Magnesium Oxide 400 Mg Tabs (Magnesium Oxide) .... 2 Tabs By Mouth Once A Day 4)  Red Yest Rice .Marland Kitchen.. 2 Capsules A Day 5)  Lunesta 3 Mg Tabs (Eszopiclone) .... One Tablet By Mouth At At Bedtime 6)  Flunisolide 0.025 % Soln (Flunisolide) .... 2 Sprays in Each Nostril Daily 7)  Triamterene-Hctz 37.5-25 Mg Tabs (Triamterene-Hctz) .... Take 1 Tablet By Mouth Once A Day 8)  Ranitidine Hcl 150 Mg Tabs (Ranitidine Hcl) .... Take 1 Tablet By Mouth Two Times A Day 9)  Combivent 18-103 Mcg/act Aero (Ipratropium-Albuterol) .Marland Kitchen.. 1 Puff Inhaled Qid 10)  Clobetasol Propionate 0.05 % Crea (Clobetasol  Propionate) .... Applhy Once Daily To Affected Area. 11)  Cosamin Asu .... Joint Health Supplement. 12)  Epipen 2-Pak 0.3 Mg/0.75ml Devi (Epinephrine) .Marland Kitchen.. 1 Injection [place] As Needed Allergic Reaction or Beestin.  Allergies (verified): 1)  ! Bentyl 2)  ! * Antispasmatics 3)  ! * Bee Stings 4)  Omeprazole (Omeprazole) 5)  Wellbutrin (Bupropion Hcl)  Comments:  Nurse/Medical Assistant: The patient's medications and allergies were reviewed with the patient and were updated in the Medication and Allergy Lists. Avon Gully CMA, Duncan Dull) (April 10, 2010 1:01 PM)  Physical Exam  General:  Well-developed,well-nourished,in no acute distress; alert,appropriate and cooperative throughout examination Head:  Normocephalic and atraumatic without obvious abnormalities. No apparent alopecia or balding. Lungs:  Normal respiratory effort, chest expands symmetrically. Lungs are clear to auscultation, no crackles or wheezes. Heart:  Normal rate and regular rhythm. S1 and S2 normal without gallop, murmur, click, rub or other extra sounds. Abdomen:  Bowel sounds positive,abdomen soft and non-tender without masses, organomegaly or hernias noted. Skin:  no rashes.   Psych:  Oriented X3 and flat affect.     Impression & Recommendations:  Problem # 1:  NAUSEA (ICD-787.02)  Seeing GI. Reviewed the note.  Normal gastric emptying study and abdominal US. Only revealed a fatty liver.  In  the last 2 weeks has felt worse and it barely eating anything. Her f/u with Gi is in about 3 weeks. Will get labs to make sure she is not dehyrated. Wiil check her thyroid adn her hormone levels thought I don't think her ovarian function is causing her hotflashes.  She has tried several nause amedicationa and I reallydon't have anything differnt to offer her. She has normal gastric emptying so not paresis so I don't think meds like reglan would really be helpful for her.  I still think the trileptal could be exacerbating hr nause  over the last couple of weeks but it is not the cause.   Orders: T-Comprehensive Metabolic Panel [id number]) T-TSH [id number]) T-Estradiol [phone]) T-Progesterone ([id number]) T-FSH [id number]) T-LH [id number]) Urinalysis-dipstick only (Medicare patient) (36644IH)  Complete Medication List: 1)  Toprol Xl 100 Mg Xr24h-tab (Metoprolol succinate) .... Take 1 tablet by mouth once a day 2)  Lovaza 1 Gm Caps (Omega-3-acid ethyl esters) .... 4 tabs by mouth daily 3)  Magnesium Oxide 400 Mg Tabs (Magnesium oxide) .... 2 tabs by mouth once a day 4)  Red Yest Rice  .Marland Kitchen.. 2 capsules a day 5)  Lunesta 3 Mg Tabs (Eszopiclone) .... One tablet by mouth at at bedtime 6)  Flunisolide 0.025 % Soln (Flunisolide) .... 2 sprays in each nostril daily 7)  Triamterene-hctz 37.5-25 Mg Tabs (Triamterene-hctz) .... Take 1 tablet by mouth once a day 8)  Ranitidine Hcl 150 Mg Tabs (Ranitidine hcl) .... Take 1 tablet by mouth two times a day 9)  Combivent 18-103 Mcg/act Aero (Ipratropium-albuterol) .Marland Kitchen.. 1 puff inhaled qid 10)  Clobetasol Propionate 0.05 % Crea (Clobetasol propionate) .... Applhy once daily to affected area. 11)  Cosamin Asu  .... Joint health supplement. 12)  Epipen 2-pak 0.3 Mg/0.83ml Devi (Epinephrine) .Marland Kitchen.. 1 injection [place] as needed allergic reaction or beestin. 13)  Trileptal 300 Mg Tabs (Oxcarbazepine) .... Takes 2 by mouth two times a day  Patient Instructions: 1)  Try to check with your pharmacy adn find out when started the trileptal.  2)  We will call you with your lab results.  3)  I will try to talk to [employer] tomorrow.   Laboratory Results   Urine Tests  Date/Time Received: 04/10/10 Date/Time Reported: 04/10/10  Routine Urinalysis   Color: yellow Appearance: Clear Glucose: negative   (Normal Range: Negative) Bilirubin: negative   (Normal Range: Negative) Ketone: negative   (Normal Range: Negative) Spec. Gravity: 1.015   (Normal Range: 1.003-1.035) Blood: negative    (Normal Range: Negative) pH: 7.0   (Normal Range: 5.0-8.0) Protein: negative   (Normal Range: Negative) Urobilinogen: 0.2   (Normal Range: 0-1) Nitrite: negative   (Normal Range: Negative) Leukocyte Esterace: negative   (Normal Range: Negative)

## 2010-10-02 NOTE — Progress Notes (Signed)
Summary: Congestion  Phone Note Call from Patient   Caller: Patient Summary of Call: Pt called c/o mucus and congestion. I advised Pt to try OTC plain mucinex and if no better by Friday call back. Pt agreed.  Initial call taken by: Payton Spark CMA,  December 06, 2009 3:24 PM

## 2010-10-02 NOTE — Progress Notes (Signed)
Summary: Dexilant denial  Phone Note From Pharmacy   [facility]  [place] [phone] Summary of Call: Received notice from pharmacy that Dexilant rx requires prior auth. Spoke to [employer] [phone] and was informed that pt must try and fail 30 days of Omeprazole and Nexium.  Ins. will fax notification to [place].  Pt has already tried Omeprazole. Please advise.  Mervin Kung CMA  February 05, 2010 5:02 PM   Follow-up for Phone Call        Please switch to Nexium 40 mg by mouth daily #30 with 0 refills Follow-up by: Lemont Fillers FNP,  February 05, 2010 5:16 PM  Additional Follow-up for Phone Call Additional follow up Details #1::        Notified pt that she must try and fail Nexium first before insurance will authorize Dexilant script. Rx. sent to pharmacy. Pt advised to call in 30 days if symptoms not  better or call before if symptoms worsen.  Nicki Guadalajara Fergerson CMA  February 06, 2010 8:58 AM     New/Updated Medications: NEXIUM 40 MG CPDR (ESOMEPRAZOLE MAGNESIUM) Take 1 capsule by mouth once a day Prescriptions: NEXIUM 40 MG CPDR (ESOMEPRAZOLE MAGNESIUM) Take 1 capsule by mouth once a day  #30 x 0   Entered by:   Mervin Kung CMA   Authorized by:   Lemont Fillers FNP   Signed by:   Mervin Kung CMA on 02/06/2010   Method used:   Electronically to        [facility] [phone]* (retail)       [address] [place], [place]  [address]       Ph: [phone] or [phone]       Fax: [phone]   RxID:   [id number]

## 2010-10-02 NOTE — Progress Notes (Signed)
Summary: Lab order for hgA1c  Phone Note Call from Patient Call back at Home Phone [phone]   Caller: Patient Call For: [name] MD Summary of Call: Pt calls back again today and wants you to add a HgA1c to her labwork. Said her psych. said sugar was up some. Is going in the morning to have her sodium rechecked downstairs. Fax order to lab Initial call taken by: Kathlene November LPN,  May 31, 2010 3:16 PM  Follow-up for Phone Call        OK to add A1C.  Follow-up by: [name] MD,  May 31, 2010 5:33 PM

## 2010-10-02 NOTE — Progress Notes (Signed)
Summary: Wants different inhaler  Phone Note Call from Patient Call back at Home Phone [phone]   Caller: Patient Call For: [name] MD Summary of Call: Pt states doesn't feel like Combivent inhaler working well for her and wanted to know if there was something stronger she could use . Initial call taken by: Kathlene November LPN,  June 12, 2010 10:43 AM  Follow-up for Phone Call        Nothing stronger "per se" but can add an inhalled steroid like floven to her regimen and then f/u in 2 weeks.   Follow-up by: [name] MD,  June 12, 2010 11:19 AM  Additional Follow-up for Phone Call Additional follow up Details #1::        Pt notified of MD instructions and med sent to pharmacy Additional Follow-up by: Kathlene November LPN,  June 12, 2010 11:28 AM    New/Updated Medications: FLOVENT HFA 110 MCG/ACT AERO (FLUTICASONE PROPIONATE  HFA) one puff inhaled two times a day Prescriptions: FLOVENT HFA 110 MCG/ACT AERO (FLUTICASONE PROPIONATE  HFA) one puff inhaled two times a day  #1 x 0   Entered and Authorized by:   [name] MD   Signed by:   [name] MD on 06/12/2010   Method used:   Electronically to        [facility] [phone]* (retail)       [address] [place], [place]  [address]       Ph: [phone] or [phone]       Fax: [phone]   RxID:   [id number]

## 2010-10-02 NOTE — Letter (Signed)
Summary: Letter with EGD Results/[facility]  Letter with EGD Results/[facility]   Imported By: Lanelle Bal 03/08/2010 13:57:50  _____________________________________________________________________  External Attachment:    Type:   Image     Comment:   External Document

## 2010-10-02 NOTE — Progress Notes (Signed)
  Phone Note Refill Request Message from:  Patient on October 09, 2009 11:25 AM  Refills Requested: Medication #1:  combivent Wants a inhaler [facility]ill [facility] [address] [place] .... Any problems call pt at [phone]  Initial call taken by: Michaelle Copas,  October 09, 2009 11:25 AM  Follow-up for Phone Call        Have pharm fax refill request. I don't see this on her med list.  pt called back got vm informer pt per Dr Michaelle Birks have pharmacy fax refill request WE DO NOT SEE INHALER ON MED LIST .Kandice Hams  October 09, 2009 4:27 PM  Follow-up by: [name] MD,  October 09, 2009 12:01 PM

## 2010-10-02 NOTE — Progress Notes (Signed)
Summary: FYI - Tx for PNA at ED  Phone Note Call from Patient Call back at Home Phone [phone]   Caller: Patient Call For: [name] MD Summary of Call: Pt calls and seen at ED last night for cough- said they are treating her for early pneumonia and given a Z-pak. Was told to call you and let PCP know Initial call taken by: Kathlene November,  April 30, 2010 8:39 AM

## 2010-10-02 NOTE — Progress Notes (Signed)
Summary: Meds  Phone Note Call from Patient Call back at Home Phone [phone]   Caller: Patient Call For: [name] MD Summary of Call: Pt states does not want the inhaler you called in because she read in the warnings and doesn't understand it and states she wants something that is simple. Something that does not have steroid in it. Says she wants pills- not sure she is confused. Pt states not feeling well and doesn't feel she can take this because it is hard to take Initial call taken by: Kathlene November LPN,  June 14, 2010 10:08 AM  Follow-up for Phone Call        It works like all her other inhalers, so I am not sure what she is talking about.  Can make nurse visit to show how to go over the medication and if her COPD is not well controlled then this is what she needs to be on or she may end up in the hospital. It will not cause weight gain.  Follow-up by: [name] MD,  June 14, 2010 10:24 AM  Additional Follow-up for Phone Call Additional follow up Details #1::        Explained to pt the MD comments- pt states she will think about it and call back if wants to schedule appt. Additional Follow-up by: Kathlene November LPN,  June 14, 2010 10:50 AM

## 2010-10-02 NOTE — Progress Notes (Signed)
Summary: Needs labs.   Phone Note Outgoing Call   Summary of Call: Call pt: tell her has to go for labs today. Not sure why she didn't go yesterday.  Initial call taken by: [name] MD,  April 13, 2010 8:00 AM  Follow-up for Phone Call        Called pt did go for labs and is doing the 24 hour urine and said they told her they would draw the sodium serum level on Monday when she came back to turn other back in. Are you ok with this or do you want her to have sodium level drawn today Follow-up by: Kathlene November,  April 13, 2010 8:18 AM  Additional Follow-up for Phone Call Additional follow up Details #1::        Still want her to get a sodium today and then one on Monday with the urine.  Additional Follow-up by: [name] MD,  April 13, 2010 8:29 AM    Additional Follow-up for Phone Call Additional follow up Details #2::    pt notified and voiced understanding Follow-up by: Avon Gully CMA, Duncan Dull),  April 13, 2010 8:53 AM

## 2010-10-02 NOTE — Progress Notes (Signed)
Summary: Lab order

## 2010-10-02 NOTE — Assessment & Plan Note (Signed)
Summary: ASTHMA, nausea   Vital Signs:  Patient profile:   65 year old female Height:      64 inches Weight:      237 pounds O2 Sat:      93 % Temp:     97.5 degrees F oral Pulse rate:   85 / minute BP sitting:   126 / 78  (left arm) Cuff size:   large  Vitals Entered By: Kathlene November (October 30, 2009 3:24 PM)  Serial Vital Signs/Assessments:                                PEF    PreRx  PostRx Time      O2 Sat  O2 Type     L/min  L/min  L/min   By 3:25 PM   93  %               220    300    260     Deborah [name] 4:10 PM                       260    280    270     Kathlene November  Comments: 3:25 PM Pt in yellow zone By: Kathlene November  4:10 PM Pt in yellow zone By: Kathlene November   CC: feels like can not get a good breath   Primary Care Provider:  [name], MD  CC:  feels like can not get a good breath.  History of Present Illness: Deborah [name] is a 65 year old woman who comes in for follow-up for asthma. She feels that at night she hears a rattling noise and wheezing while she is breathing out. Coughing fits wake her up frequently throughout the night and she is tired during the day. She has been having shortness of breath with exertion. She is only using her Pulmicort nebulizer on days when she feels short of breath. Not using daily. Feels having alot of sinus drinage.  no watery eyes or itchy nose or throat.  Using her combivent 2-3 puffs  every day. Worse if outside.    For the past five days she has felt nauseated with loss of appetite for 5-6 hours. Last night she felt some facial flushing and felt that she had a fever but did not take her temperature. She has been taking two Pepto Bismol which has helped. No chest pain or vomiting.   Allergies: 1)  ! Bentyl 2)  ! * Antispasmatics 3)  ! * Bee Stings 4)  Omeprazole (Omeprazole) 5)  Wellbutrin (Bupropion Hcl)  Past History:  Past Medical History:  BIPOLAR DISORDER UNSPECIFIED (ICD-296.80)-- [name],  NP DEPRESSION (ICD-311) -Sees [name].    Physical Exam  General:  Well-developed,well-nourished,in no acute distress; alert,appropriate and cooperative throughout examination Head:  Normocephalic and atraumatic without obvious abnormalities.  Eyes:  No corneal or conjunctival inflammation noted.  Lungs:  End expiratory wheeze at the bases bilaerally. Wheeze resolved after the NEB tx.  Heart:  Normal rate and regular rhythm. S1 and S2 normal without gallop, murmur, click, rub or other extra sounds. Abdomen:  Bowel sounds positive,abdomen soft and non-tender without masses, organomegaly or hernias noted. Skin:  no rashes.   Cervical Nodes:  No lymphadenopathy noted Psych:  Cognition and judgment appear intact. Alert and cooperative with normal attention span and concentration. No apparent delusions, illusions, hallucinations  Impression & Recommendations:  Problem # 1:  ASTHMA (ICD-493.90) Assessment Deteriorated  Will get copy of last PFTs from [facility]. In [place] hold the pulmocort and will start syumbicort two times a day. REviwed that I really want her to use this consistantly.  Can use her labuterol neb on top of this as needed for SOB or wheezing. Her peak flows didn't really improve but overall her lungs sounded better post NEB. F/U in 3-4 weeks to make sure she is improving. Also want PFTs to confirm whether or not she has asthma or COPD, or both.  Her updated medication list for this problem includes:    Pulmicort 0.5 Mg/23ml Susp (Budesonide) .Marland Kitchen... 2ml neb two times a day    Albuterol Sulfate (2.5 Mg/48ml) 0.083% Nebu (Albuterol sulfate) ..... One neb tx every four hours as needed for sob    Combivent 18-103 Mcg/act Aero (Ipratropium-albuterol) .Marland Kitchen... 1 puff inhaled qid    Symbicort 160-4.5 Mcg/act Aero (Budesonide-formoterol fumarate) .Marland Kitchen... 2 puffs inhaled two times a day  Orders: Albuterol Sulfate Sol 1mg  unit dose (Z6109) Nebulizer Tx (60454)  Problem # 2:  NAUSEA  (ICD-787.02) Assessment: New Discussed treating with a PPI. ON an H2 blocker currently.  Given samples of prevacid. If not better in 5 days then will get labs to eval the liver and  pancreas.   Complete Medication List: 1)  Toprol Xl 100 Mg Xr24h-tab (Metoprolol succinate) .... Take 1 tablet by mouth once a day 2)  Lovaza 1 Gm Caps (Omega-3-acid ethyl esters) .... 4 tabs by mouth daily 3)  Calcium Antacid Ultra 1000 Mg Chew (Calcium carbonate antacid) .... Take 1 tablet by mouth once a day 4)  Magnesium Oxide 400 Mg Tabs (Magnesium oxide) .... 2 tabs by mouth once a day 5)  Pulmicort 0.5 Mg/38ml Susp (Budesonide) .... 2ml neb two times a day 6)  Red Yest Rice  .Marland Kitchen.. 2 capsules a day 7)  Lunesta 3 Mg Tabs (Eszopiclone) .... One tablet by mouth at at bedtime 8)  Flunisolide 0.025 % Soln (Flunisolide) .... 2 sprays in each nostril daily 9)  Albuterol Sulfate (2.5 Mg/75ml) 0.083% Nebu (Albuterol sulfate) .... One neb tx every four hours as needed for sob 10)  Triamterene-hctz 37.5-25 Mg Tabs (Triamterene-hctz) .... Take 1 tablet by mouth once a day 11)  Emsam 9 Mg/24hr Pt24 (Selegiline) .... Apply  patch daily 12)  Aspirin 81 Mg Tabs (Aspirin) .... Two-three tablets by mouth daily 13)  Ranitidine Hcl 150 Mg Tabs (Ranitidine hcl) .... Take 1 tablet by mouth two times a day 14)  Zantac 150 Mg Caps (Ranitidine hcl) 15)  Gabapentin 100 Mg Caps (Gabapentin) .... Take 1 cap by mouth at bedtime x 7 days, then 1 cap by mouth two times a day x 7 days, then three times a day once daily thereafter 16)  Combivent 18-103 Mcg/act Aero (Ipratropium-albuterol) .Marland Kitchen.. 1 puff inhaled qid 17)  Clobetasol Propionate 0.05 % Crea (Clobetasol propionate) .... Applhy once daily to affected area. 18)  Cosamin Asu  .... Joint health supplement. 19)  Symbicort 160-4.5 Mcg/act Aero (Budesonide-formoterol fumarate) .... 2 puffs inhaled two times a day 20)  Hydrocodone-homatropine 5-1.5 Mg/51ml Syrp (Hydrocodone-homatropine) ....  5ml by mouth at bedtime as needed cough  Patient Instructions: 1)  Start symbicort 2 puffs inhaled two times a day 2)  Can use either albuterol NEB or combivent inhaler if needed for Shortness of breath or wheezing.   3)  Call if feels getting worse.  Prescriptions: HYDROCODONE-HOMATROPINE  5-1.5 MG/5ML SYRP (HYDROCODONE-HOMATROPINE) 5ml by mouth at bedtime as needed cough  #173ml x 0   Entered and Authorized by:   [name] MD   Signed by:   [name] MD on 10/30/2009   Method used:   Print then Give to Patient   RxID:   [id number] SYMBICORT 160-4.5 MCG/ACT AERO (BUDESONIDE-FORMOTEROL FUMARATE) 2 puffs inhaled two times a day  #1 x 1   Entered and Authorized by:   [name] MD   Signed by:   [name] MD on 10/30/2009   Method used:   [facility] to        [facility] [phone]* (retail)       [address] [place], [place]  [address]       Ph: [phone] or [phone]       Fax: [phone]   RxID:   [id number]    Medication Administration  Medication # 1:    Medication: Albuterol Sulfate Sol 1mg  unit dose    Diagnosis: ASTHMA (ICD-493.90)    Dose: 2.5mg     Route: inhaled    Exp Date: 05/04/2011    Lot #: [id number]    Mfr: Nephron    Patient tolerated medication without complications    Given by: Kathlene November (October 30, 2009 4:04 PM)  Orders Added: 1)  Albuterol Sulfate Sol 1mg  unit dose [J7613] 2)  Nebulizer Tx [94640] 3)  Est. Patient Level IV [84166]

## 2010-10-02 NOTE — Progress Notes (Signed)
Summary: pt. has a questioin for the nurse Re: Nebulizer  Phone Note Call from Patient   Caller: Patient Summary of Call: Pt. called and left a message for the nurse to call her back at [phone], she has a question about her nebulizer.Michaelle Copas  June 08, 2010 2:43 PM  Initial call taken by: Michaelle Copas,  June 08, 2010 2:43 PM  Follow-up for Phone Call        had already talked with pt about this Follow-up by: Avon Gully CMA, Duncan Dull),  June 11, 2010 8:21 AM

## 2010-10-02 NOTE — Assessment & Plan Note (Signed)
Summary: f/u on med, still feels nauseated.    Vital Signs:  Patient profile:   65 year old female Height:      64 inches Weight:      218 pounds Pulse rate:   91 / minute BP sitting:   130 / 72  (right arm) Cuff size:   large  Vitals Entered By: Avon Gully CMA, (AAMA) (April 23, 2010 11:01 AM) CC: discuss BP meds   Primary Care Provider:  [name], MD  CC:  discuss BP meds.  History of Present Illness: Still feels she can't eat. Says when she does try to eat she feel full quickly and then feels bad. She has had a normal gastric emptying study and had f/u iwht  GI in 2 weeks.  Does feel some better at night.  Still can drink and has been trying to stay hdyrated.  Thought today she says she feels lightheaded and dizzy and may be dehydrated.  I had decreased her triptal initially when I thought it could be contributing to her hyponatremia, but she stopped it completley on her own.   Current Medications (verified): 1)  Toprol Xl 100 Mg Xr24h-Tab (Metoprolol Succinate) .... Take 1 Tablet By Mouth Once A Day 2)  Lovaza 1 Gm Caps (Omega-3-Acid Ethyl Esters) .... 4 Tabs By Mouth Daily 3)  Magnesium Oxide 400 Mg Tabs (Magnesium Oxide) .... 2 Tabs By Mouth Once A Day 4)  Red Yest Rice .Marland Kitchen.. 2 Capsules A Day 5)  Lunesta 3 Mg Tabs (Eszopiclone) .... One Tablet By Mouth At At Bedtime 6)  Flunisolide 0.025 % Soln (Flunisolide) .... 2 Sprays in Each Nostril Daily 7)  Ranitidine Hcl 150 Mg Tabs (Ranitidine Hcl) .... Take 1 Tablet By Mouth Two Times A Day 8)  Combivent 18-103 Mcg/act Aero (Ipratropium-Albuterol) .Marland Kitchen.. 1 Puff Inhaled Qid 9)  Clobetasol Propionate 0.05 % Crea (Clobetasol Propionate) .... Applhy Once Daily To Affected Area. 10)  Cosamin Asu .... Joint Health Supplement. 11)  Epipen 2-Pak 0.3 Mg/0.58ml Devi (Epinephrine) .Marland Kitchen.. 1 Injection [place] As Needed Allergic Reaction or Beestin. 12)  Trileptal 300 Mg Tabs (Oxcarbazepine) .... Takes 2 By Mouth Two Times A  Day  Allergies (verified): 1)  ! Bentyl 2)  ! * Antispasmatics 3)  ! * Bee Stings 4)  Omeprazole (Omeprazole) 5)  Wellbutrin (Bupropion Hcl)  Comments:  Nurse/Medical Assistant: The patient's medications and allergies were reviewed with the patient and were updated in the Medication and Allergy Lists. Avon Gully CMA, Duncan Dull) (April 23, 2010 11:02 AM)  Physical Exam  General:  Well-developed,well-nourished,in no acute distress; alert,appropriate and cooperative throughout examination Skin:  no rashes.   Psych:  Cognition and judgment appear intact. Alert and cooperative with normal attention span and concentration. No apparent delusions, illusions, hallucinations   Impression & Recommendations:  Problem # 1:  HYPERTENSION (ICD-401.9) Discussed will add a low dose ACEi for BP control since off the diuretic.  Her updated medication list for this problem includes:    Toprol Xl 100 Mg Xr24h-tab (Metoprolol succinate) .Marland Kitchen... Take 1 tablet by mouth once a day    Lisinopril 5 Mg Tabs (Lisinopril) .Marland Kitchen... Take 1 tablet by mouth once a day  Problem # 2:  HYPONATREMIA (ICD-276.1) REcheck was normal on [place]. Have her stop the sodium tabs and will recheck level on Friday.  Orders: T-Basic Metabolic Panel [phone])  Problem # 3:  NAUSEA (ICD-787.02) She is not really better but I don't know what else to offer her.  Has  had neg GI w/u, abdomina US, Head CT and had treid 3 different anti-emetics. Consider it may be relatee to her mood. Have her restart the tripetal.  It was initally stopped because of possibility of causing the hyponatremia, but I think it was really her diuretic.   Complete Medication List: 1)  Toprol Xl 100 Mg Xr24h-tab (Metoprolol succinate) .... Take 1 tablet by mouth once a day 2)  Lovaza 1 Gm Caps (Omega-3-acid ethyl esters) .... 4 tabs by mouth daily 3)  Magnesium Oxide 400 Mg Tabs (Magnesium oxide) .... 2 tabs by mouth once a day 4)  Red Yest Rice  .Marland Kitchen.. 2  capsules a day 5)  Lunesta 3 Mg Tabs (Eszopiclone) .... One tablet by mouth at at bedtime 6)  Flunisolide 0.025 % Soln (Flunisolide) .... 2 sprays in each nostril daily 7)  Ranitidine Hcl 150 Mg Tabs (Ranitidine hcl) .... Take 1 tablet by mouth two times a day 8)  Combivent 18-103 Mcg/act Aero (Ipratropium-albuterol) .Marland Kitchen.. 1 puff inhaled qid 9)  Clobetasol Propionate 0.05 % Crea (Clobetasol propionate) .... Applhy once daily to affected area. 10)  Cosamin Asu  .... Joint health supplement. 11)  Epipen 2-pak 0.3 Mg/0.50ml Devi (Epinephrine) .Marland Kitchen.. 1 injection [place] as needed allergic reaction or beestin. 12)  Trileptal 300 Mg Tabs (Oxcarbazepine) .... Takes 2 by mouth two times a day 13)  Lisinopril 5 Mg Tabs (Lisinopril) .... Take 1 tablet by mouth once a day  Patient Instructions: 1)  Go on Friday to have your sodium rechecked. Stop your sodium tabs.  2)  Add lisinopril 5mg  once a day to your blood pressure pill regimen.  3)  Please schedule a follow-up appointment in 1 month for hypertension.  4)  Keep you followup with GI.  Prescriptions: LISINOPRIL 5 MG TABS (LISINOPRIL) Take 1 tablet by mouth once a day  #30 x 1   Entered and Authorized by:   [name] MD   Signed by:   [name] MD on 04/23/2010   Method used:   Electronically to        [facility] [phone][facility] (retail)       [address] [place], [place]  [address]       Ph: [phone] or [phone]       Fax: [phone]   RxID:   [id number]   Laboratory Results   Urine Tests  Date/Time Received: 04/23/10 Date/Time Reported: 04/23/10  Routine Urinalysis   Color: yellow Appearance: Clear Glucose: negative   (Normal Range: Negative) Bilirubin: negative   (Normal Range: Negative) Ketone: negative   (Normal Range: Negative) Spec. Gravity: >=1.030   (Normal Range: 1.003-1.035) Blood: negative   (Normal Range: Negative) pH: 5.5   (Normal Range: 5.0-8.0) Protein: trace   (Normal Range:  Negative) Urobilinogen: 0.2   (Normal Range: 0-1) Nitrite: negative   (Normal Range: Negative) Leukocyte Esterace: small   (Normal Range: Negative)

## 2010-10-02 NOTE — Assessment & Plan Note (Signed)
Summary: COUGH/KH   Vital Signs:  Patient Profile:   65 Years Old Female CC:      Sleepy, tired,  asthma, cough x 5 days Height:     64 inches O2 Sat:      94 % O2 treatment:    Room Air Temp:     96.6 degrees F oral Pulse rate:   91 / minute Pulse rhythm:   regular Resp:     16 per minute BP sitting:   154 / 93  (right arm) Cuff size:   large  Vitals Entered By: Emilio Math (September 04, 2009 5:43 PM)                  Current Allergies: ! BENTYL ! * ANTISPASMATICS ! * BEE STINGS OMEPRAZOLE (OMEPRAZOLE) WELLBUTRIN (BUPROPION HCL)History of Present Illness Chief Complaint: Sleepy, tired,  asthma, cough x 5 days History of Present Illness: Terrible historian.  Presents with multiple complaints.  Fatigue, daytime sleepiness, and worsening asthma.   Says that she is feeling more shortness of breath and wheezing.  Has a very hard time telling me when her asthma worsened.   At first she said it was a month ago, and when I reminded her that I saw her 2 weeks ago for sinusitus and documented that at that time she said her asthma was controlled she changed her story.   Now she says that her asthma has been worse for a couple of days.     In any event, she is just now finishing a 2 week course of augmentin.  She has  prescription for inhaled steroids, nasal steroids, and  albuterol nebulizer.   She is taking none of these regularly.      Current Meds TOPROL XL 100 MG XR24H-TAB (METOPROLOL SUCCINATE) Take 1 tablet by mouth once a day LOVAZA 1 GM CAPS (OMEGA-3-ACID ETHYL ESTERS) 4 tabs by mouth daily CALCIUM ANTACID ULTRA 1000 MG CHEW (CALCIUM CARBONATE ANTACID) Take 1 tablet by mouth once a day MAGNESIUM OXIDE 400 MG TABS (MAGNESIUM OXIDE) 2 tabs by mouth once a day PULMICORT 0.5 MG/2ML SUSP (BUDESONIDE) 2ml NEB two times a day B COMPLEX 100  TABS (B COMPLEX VITAMINS) one tablet by mouth once daily * RED YEST RICE 2 capsules a day CLONAZEPAM 0.5 MG TABS (CLONAZEPAM) Take 1 tablet  by mouth three times a day as needed LUNESTA 3 MG TABS (ESZOPICLONE) one tablet by mouth at at bedtime FLUNISOLIDE 0.025 % SOLN (FLUNISOLIDE) 2 sprays in each nostril daily ALBUTEROL SULFATE (2.5 MG/3ML) 0.083% NEBU (ALBUTEROL SULFATE) ONE NEB TX EVERY FOUR HOURS AS NEEDED FOR SOB TRIAMTERENE-HCTZ 37.5-25 MG TABS (TRIAMTERENE-HCTZ) Take 1 tablet by mouth once a day EMSAM 9 MG/24HR PT24 (SELEGILINE) Apply  patch daily ASPIRIN 81 MG  TABS (ASPIRIN) two-three tablets by mouth daily MULTIVITAMINS   TABS (MULTIPLE VITAMIN) one tablet by mouth once daily CLOBETASOL PROPIONATE 0.05 % OINT (CLOBETASOL PROPIONATE) Apply once daily to affected area FLUTICASONE PROPIONATE 50 MCG/ACT SUSP (FLUTICASONE PROPIONATE) 2 sprays in each nostril once daily NASACORT AQ 55 MCG/ACT AERS (TRIAMCINOLONE ACETONIDE(NASAL)) 2 sprays in each nostril once daily ABILIFY 10 MG TABS (ARIPIPRAZOLE) Take 1 tablet by mouth once a day RANITIDINE HCL 150 MG TABS (RANITIDINE HCL) Take 1 tablet by mouth two times a day ZANTAC 150 MG CAPS (RANITIDINE HCL)  AUGMENTIN 500-125 MG TABS (AMOXICILLIN-POT CLAVULANATE) 1 by mouth 2 times daily  REVIEW OF SYSTEMS Constitutional Symptoms       Complains of fatigue.  Denies fever, chills, night sweats, weight loss, and weight gain.  Eyes       Denies change in vision, eye pain, eye discharge, glasses, contact lenses, and eye surgery. Ear/Nose/Throat/Mouth       Denies hearing loss/aids, change in hearing, ear pain, ear discharge, dizziness, frequent runny nose, frequent nose bleeds, sinus problems, sore throat, hoarseness, and tooth pain or bleeding.  Respiratory       Complains of dry cough and asthma.      Denies productive cough, wheezing, shortness of breath, bronchitis, and emphysema/COPD.  Cardiovascular       Complains of tires easily with exhertion.      Denies murmurs and chest pain.    Gastrointestinal       Denies stomach pain, nausea/vomiting, diarrhea, constipation, blood  in bowel movements, and indigestion. Genitourniary       Denies painful urination, kidney stones, and loss of urinary control. Neurological       Denies paralysis, seizures, and fainting/blackouts. Musculoskeletal       Denies muscle pain, joint pain, joint stiffness, decreased range of motion, redness, swelling, muscle weakness, and gout.  Skin       Denies bruising, unusual mles/lumps or sores, and hair/skin or nail changes.  Psych       Denies mood changes, temper/anger issues, anxiety/stress, speech problems, depression, and sleep problems.  Past History:  Past Medical History: Reviewed history from 06/15/2009 and no changes required. Current Problems:  MONONUCLEOSIS (ICD-075) DEGENERATIVE JOINT DISEASE (ICD-715.90) ALLERGIC RHINITIS CAUSE UNSPECIFIED (ICD-477.9) HEPATIC CYST (ICD-573.8) Hx of PNEUMONIA (ICD-486) COPD (ICD-496) LACTOSE INTOLERANCE (ICD-271.3) HIATAL HERNIA (ICD-553.3) GERD (ICD-530.81) DIVERTICULAR DISEASE (ICD-562.10) IRRITABLE BOWEL SYNDROME (ICD-564.1) HEADACHE, CHRONIC (ICD-784.0) BIPOLAR DISORDER UNSPECIFIED (ICD-296.80)-- [name], NP DEPRESSION (ICD-311) -Sees [name].   ANXIETY (ICD-300.00) HYPERCHOLESTEROLEMIA (ICD-272.0) ARRHYTHMIA, HX OF (ICD-V12.50) CARDIOMEGALY (ICD-429.3) HYPERTENSION (ICD-401.9)  Past Surgical History: Reviewed history from 10/28/2007 and no changes required. cholecystectomy partial hysterectomy ovarian cystectomy removal of cyst on chest wall  Family History: Reviewed history from 04/03/2009 and no changes required. mother deceased - emphysema father deceased - heart blockage one brother alive and healthy one brother alive and altzheimers Family History of Colon Cancer:Maternal Grandfather   Social History: Reviewed history from 03/23/2008 and no changes required. Retired day Occupational hygienist. Widowed x 1 yr.  Has 5 kids - 1 boy, 4 girls.  Youngest lives w/ her. Never smoked. Denies ETOH. Exercises 3  x a wk. G6P5 Physical Exam General appearance: well developed, well nourished, no acute distress Oral/Pharynx: tongue normal, posterior pharynx without erythema or exudate Chest/Lungs: no rales, wheezes, or rhonchi bilateral, breath sounds equal without effort Heart: regular rate and  rhythm, no murmur  Plan New Medications/Changes: FLUNISOLIDE 0.025 % SOLN (FLUNISOLIDE) 2 sprays in each nostril daily  #3 bottles x 3, 09/04/2009, Kathrine Haddock MD ALBUTEROL SULFATE (2.5 MG/3ML) 0.083% NEBU (ALBUTEROL SULFATE) ONE NEB TX EVERY FOUR HOURS AS NEEDED FOR SOB  #1 BOX x 4, 09/04/2009, Kathrine Haddock MD PULMICORT 0.5 MG/2ML SUSP (BUDESONIDE) 2ml NEB two times a day  #30 day sup x 2, 09/04/2009, Kathrine Haddock MD  New Orders: Est. Patient Level III [id number] Planning Comments:   I instructed her to follow up with Dr. Linford Arnold later this week for follow up on her complaints of fatugue and asthma. I noticed that she was recently worked up at [facility] for dizziness.   I assume lots of bloodwork was done at that time and was unrevealing.   I  did do a PFT in the clinic today.   She had terrible technique and results of pft testing were moderate restrictive pattern.  No obstructive pattern consistent with asthma attack.  Lungs were clear on exam today as well.  I did review treatment of asthma with her and refilled her pulmacort, albuterol nebulizer and nasal steroid prescriptions.   I told her the importance of compliance with recommended treatment.   The patient and/or caregiver has been counseled thoroughly with regard to medications prescribed including dosage, schedule, interactions, rationale for use, and possible side effects and they verbalize understanding.  Diagnoses and expected course of recovery discussed and will return if not improved as expected or if the condition worsens. Patient and/or caregiver verbalized understanding.  Prescriptions: FLUNISOLIDE 0.025 % SOLN (FLUNISOLIDE) 2 sprays in  each nostril daily  #3 bottles x 3   Entered and Authorized by:   Kathrine Haddock MD   Signed by:   Kathrine Haddock MD on 09/04/2009   Method used:   [facility]ronically to        [facility] [phone][facility] (retail)       [address] [place], [place]  [address]       Ph: [phone] or [phone]       Fax: [phone]   RxID:   [id number] ALBUTEROL SULFATE (2.5 MG/3ML) 0.083% NEBU (ALBUTEROL SULFATE) ONE NEB TX EVERY FOUR HOURS AS NEEDED FOR SOB  #1 BOX x 4   Entered and Authorized by:   Kathrine Haddock MD   Signed by:   Kathrine Haddock MD on 09/04/2009   Method used:   Electronically to        [facility] [phone]* (retail)       [address] [place], [place]  [address]       Ph: [phone] or [phone]       Fax: [phone]   RxID:   [id number] PULMICORT 0.5 MG/2ML SUSP (BUDESONIDE) 2ml NEB two times a day  #30 day sup x 2   Entered and Authorized by:   Kathrine Haddock MD   Signed by:   Kathrine Haddock MD on 09/04/2009   Method used:   Electronically to        [facility] [phone]* (retail)       [address] [place], [place]  [address]       Ph: [phone] or [phone]       Fax: [phone]   RxID:   [id number]

## 2010-10-02 NOTE — Progress Notes (Signed)
Summary: PPI  Phone Note Call from Patient Call back at Home Phone [phone]   Caller: Patient Call For: [name] MD Summary of Call: Pt calls again and states that she spoke with [name] her old PCP and she recommended her maybe getting on a PPI long term for the nausea [facility] [place]. Can not take one that Melissa put her on.? using Nexium or something like that Initial call taken by: Kathlene November LPN,  May 30, 2010 9:37 AM  Follow-up for Phone Call        No problem. Rx for nexium sent.  Follow-up by: [name] MD,  May 30, 2010 12:15 PM  Additional Follow-up for Phone Call Additional follow up Details #1::        Pt notified Additional Follow-up by: Kathlene November LPN,  May 30, 2010 12:32 PM    New/Updated Medications: NEXIUM 40 MG CPDR (ESOMEPRAZOLE MAGNESIUM) Take 1 tablet by mouth once a day Prescriptions: NEXIUM 40 MG CPDR (ESOMEPRAZOLE MAGNESIUM) Take 1 tablet by mouth once a day  #30 x 3   Entered and Authorized by:   [name] MD   Signed by:   [name] MD on 05/30/2010   Method used:   [facility] to        [facility] [phone][facility] (retail)       [address] [place], [place]  [address]       Ph: [phone] or [phone]       Fax: [phone]   RxID:   [id number]

## 2010-10-02 NOTE — Letter (Signed)
Summary: [facility]  [facility]   Imported By: Lanelle Bal 03/06/2010 09:54:02  _____________________________________________________________________  External Attachment:    Type:   Image     Comment:   External Document

## 2010-10-02 NOTE — Letter (Signed)
Summary: [facility]  [facility]   Imported By: Lanelle Bal 02/23/2010 08:26:08  _____________________________________________________________________  External Attachment:    Type:   Image     Comment:   External Document

## 2010-10-02 NOTE — Assessment & Plan Note (Signed)
Summary: Otitis externa   Vital Signs:  Patient profile:   65 year old female Height:      64 inches Weight:      240 pounds BMI:     41.34 O2 Sat:      96 % on Room air Temp:     97.4 degrees F oral Pulse rate:   97 / minute BP sitting:   122 / 74  (left arm) Cuff size:   large  Vitals Entered By: Kathlene November (October 03, 2009 11:25 AM)/April  O2 Flow:  Room air CC: c/o head congestion x 3 days, HA and slight ear pain   Primary Care Provider:  [name], MD  CC:  c/o head congestion x 3 days and HA and slight ear pain.  History of Present Illness: c/o head congestion x 3 days, HA and slight left ear pain.  Now both ears are hurting.  Pain is mild and more annoying. No ear drainage.  No OTC tx for her sxs.  No fever.  No ST. No N/V/D.  No worsenig or alleviating sxs.   Feels can't hear well out of either ear this AM. Denies using Qtips or any intiating events or trauma. No worsengin or alleviating sxs.   Current Medications (verified): 1)  Toprol Xl 100 Mg Xr24h-Tab (Metoprolol Succinate) .... Take 1 Tablet By Mouth Once A Day 2)  Lovaza 1 Gm Caps (Omega-3-Acid Ethyl Esters) .... 4 Tabs By Mouth Daily 3)  Calcium Antacid Ultra 1000 Mg Chew (Calcium Carbonate Antacid) .... Take 1 Tablet By Mouth Once A Day 4)  Magnesium Oxide 400 Mg Tabs (Magnesium Oxide) .... 2 Tabs By Mouth Once A Day 5)  Pulmicort 0.5 Mg/53ml Susp (Budesonide) .... 2ml Neb Two Times A Day 6)  Red Yest Rice .Marland Kitchen.. 2 Capsules A Day 7)  Lunesta 3 Mg Tabs (Eszopiclone) .... One Tablet By Mouth At At Bedtime 8)  Flunisolide 0.025 % Soln (Flunisolide) .... 2 Sprays in Each Nostril Daily 9)  Albuterol Sulfate (2.5 Mg/39ml) 0.083% Nebu (Albuterol Sulfate) .... One Neb Tx Every Four Hours As Needed For Sob 10)  Triamterene-Hctz 37.5-25 Mg Tabs (Triamterene-Hctz) .... Take 1 Tablet By Mouth Once A Day 11)  Emsam 9 Mg/24hr Pt24 (Selegiline) .... Apply  Patch Daily 12)  Aspirin 81 Mg  Tabs (Aspirin) .... Two-Three  Tablets By Mouth Daily 13)  Clobetasol Propionate 0.05 % Oint (Clobetasol Propionate) .... Apply Once Daily To Affected Area 14)  Fluticasone Propionate 50 Mcg/act Susp (Fluticasone Propionate) .... 2 Sprays in Each Nostril Once Daily 15)  Nasacort Aq 55 Mcg/act Aers (Triamcinolone Acetonide(Nasal)) .... 2 Sprays in Each Nostril Once Daily 16)  Ranitidine Hcl 150 Mg Tabs (Ranitidine Hcl) .... Take 1 Tablet By Mouth Two Times A Day 17)  Zantac 150 Mg Caps (Ranitidine Hcl) 18)  Gabapentin 100 Mg Caps (Gabapentin) .... Take 1 Cap By Mouth At Bedtime X 7 Days, Then 1 Cap By Mouth Two Times A Day X 7 Days, Then Three Times A Day Once Daily Thereafter  Allergies (verified): 1)  ! Bentyl 2)  ! * Antispasmatics 3)  ! * Bee Stings 4)  Omeprazole (Omeprazole) 5)  Wellbutrin (Bupropion Hcl)  Physical Exam  General:  Well-developed,well-nourished,in no acute distress; alert,appropriate and cooperative throughout examination Head:  Normocephalic and atraumatic without obvious abnormalities. No apparent alopecia or balding. Eyes:  No corneal or conjunctival inflammation noted. EOMI. Perrla.  Ears:  Left ear canal is swollen  with white debris. Unable  to visualize the TM.  Right  TM and canal is clear.  Nose:  External nasal examination shows no deformity or inflammation.  Mouth:  Oral mucosa and oropharynx without lesions or exudates.  Teeth in good repair. Neck:  No deformities, masses, or tenderness noted. Lungs:  Normal respiratory effort, chest expands symmetrically. Lungs are clear to auscultation, no crackles or wheezes. Heart:  Normal rate and regular rhythm. S1 and S2 normal without gallop, murmur, click, rub or other extra sounds. Cervical Nodes:  no anterior cervical adenopathy.     Impression & Recommendations:  Problem # 1:  OTITIS EXTERNA, ACUTE, LEFT (ICD-380.12)  Her updated medication list for this problem includes:    Cipro Hc 0.2-1 % Susp (Ciprofloxacin-hydrocortisone) .Marland KitchenMarland KitchenMarland KitchenMarland Kitchen 4  gtts in each ear two times a day for 7 days.  Discussed symptomatic treatment and preventive measures. Will go ahead and haver her treat both ears since noticed some discomfort in both this am.  I see no other sign of URI sxs on her exam.  If not better in 3-4 days then call the office.   Complete Medication List: 1)  Toprol Xl 100 Mg Xr24h-tab (Metoprolol succinate) .... Take 1 tablet by mouth once a day 2)  Lovaza 1 Gm Caps (Omega-3-acid ethyl esters) .... 4 tabs by mouth daily 3)  Calcium Antacid Ultra 1000 Mg Chew (Calcium carbonate antacid) .... Take 1 tablet by mouth once a day 4)  Magnesium Oxide 400 Mg Tabs (Magnesium oxide) .... 2 tabs by mouth once a day 5)  Pulmicort 0.5 Mg/8ml Susp (Budesonide) .... 2ml neb two times a day 6)  Red Yest Rice  .Marland Kitchen.. 2 capsules a day 7)  Lunesta 3 Mg Tabs (Eszopiclone) .... One tablet by mouth at at bedtime 8)  Flunisolide 0.025 % Soln (Flunisolide) .... 2 sprays in each nostril daily 9)  Albuterol Sulfate (2.5 Mg/64ml) 0.083% Nebu (Albuterol sulfate) .... One neb tx every four hours as needed for sob 10)  Triamterene-hctz 37.5-25 Mg Tabs (Triamterene-hctz) .... Take 1 tablet by mouth once a day 11)  Emsam 9 Mg/24hr Pt24 (Selegiline) .... Apply  patch daily 12)  Aspirin 81 Mg Tabs (Aspirin) .... Two-three tablets by mouth daily 13)  Clobetasol Propionate 0.05 % Oint (Clobetasol propionate) .... Apply once daily to affected area 14)  Fluticasone Propionate 50 Mcg/act Susp (Fluticasone propionate) .... 2 sprays in each nostril once daily 15)  Nasacort Aq 55 Mcg/act Aers (Triamcinolone acetonide(nasal)) .... 2 sprays in each nostril once daily 16)  Ranitidine Hcl 150 Mg Tabs (Ranitidine hcl) .... Take 1 tablet by mouth two times a day 17)  Zantac 150 Mg Caps (Ranitidine hcl) 18)  Gabapentin 100 Mg Caps (Gabapentin) .... Take 1 cap by mouth at bedtime x 7 days, then 1 cap by mouth two times a day x 7 days, then three times a day once daily thereafter 19)   Cipro Hc 0.2-1 % Susp (Ciprofloxacin-hydrocortisone) .... 4 gtts in each ear two times a day for 7 days. Prescriptions: CIPRO HC 0.2-1 % SUSP (CIPROFLOXACIN-HYDROCORTISONE) 4 gtts in each ear two times a day for 7 days.  #1 bottle x 0   Entered and Authorized by:   [name] MD   Signed by:   [name] MD on 10/03/2009   Method used:   Electronically to        [facility] [phone]* (retail)       [address] [place]       [place],  [place]  [address]       Ph: [phone] or [phone]       Fax: [phone]   RxID:   [id number]

## 2010-10-02 NOTE — Miscellaneous (Signed)
  Clinical Lists Changes  Problems: Added new problem of HYPERGLYCEMIA (ICD-790.29) Orders: Added new Test order of T-Hgb A1C ([id number]) - Signed

## 2010-10-02 NOTE — Progress Notes (Signed)
Summary: meds  Phone Note Call from Patient   Caller: Patient Call For: [name] MD Summary of Call: Pt called and states the new BP med makes her cough and wants to know what eles can she try because she doesnt think she can deal with the cough Initial call taken by: Avon Gully CMA, Duncan Dull),  April 26, 2010 11:56 AM  Follow-up for Phone Call        OK will change to losartan that does't cause cough.  Follow-up by: [name] MD,  April 26, 2010 12:07 PM  Additional Follow-up for Phone Call Additional follow up Details #1::        notified pt.Pt also wants to know what eles she can do for her hot flashes. states she had bld work done and it was nml but still having the hot flashes Additional Follow-up by: Avon Gully CMA, Duncan Dull),  April 26, 2010 12:13 PM    Additional Follow-up for Phone Call Additional follow up Details #2::    Can try black cohash OTC first. IF not helping then we can discusse Hormone therapy or alternative rx medications.  Follow-up by: [name] MD,  April 26, 2010 12:24 PM  Additional Follow-up for Phone Call Additional follow up Details #3:: Details for Additional Follow-up Action Taken: Pt notified of MD instructions. Med sent KJ LPN Additional Follow-up by: Kathlene November,  April 27, 2010 8:20 AM  New/Updated Medications: LOSARTAN POTASSIUM 25 MG TABS (LOSARTAN POTASSIUM) Take 1 tablet by mouth once a day Prescriptions: LOSARTAN POTASSIUM 25 MG TABS (LOSARTAN POTASSIUM) Take 1 tablet by mouth once a day  #30 x 1   Entered and Authorized by:   [name] MD   Signed by:   [name] MD on 04/26/2010   Method used:   Electronically to        [facility] [phone]* (retail)       [address] [place], [place]  [address]       Ph: [phone] or [phone]       Fax: [phone]   RxID:   [id number]

## 2010-10-02 NOTE — Consult Note (Signed)
Summary: [facility]  [facility]   Imported By: Lanelle Bal 03/16/2010 12:26:26  _____________________________________________________________________  External Attachment:    Type:   Image     Comment:   External Document

## 2010-10-02 NOTE — Progress Notes (Signed)
Summary: Combivent inhaler  Phone Note Call from Patient Call back at Home Phone [phone]   Caller: Patient Call For: [name] MD Summary of Call: pt calls and wants a Combivent inhalation aerosal inhaler to use as needed wants 2 pack. [facility] Initial call taken by: Kathlene November,  October 20, 2009 1:00 PM    New/Updated Medications: COMBIVENT 18-103 MCG/ACT AERO (IPRATROPIUM-ALBUTEROL) 1 puff inhaled QID Prescriptions: COMBIVENT 18-103 MCG/ACT AERO (IPRATROPIUM-ALBUTEROL) 1 puff inhaled QID  #2 x 2   Entered and Authorized by:   [name] MD   Signed by:   [name] MD on 10/20/2009   Method used:   Electronically to        [facility] [phone]* (retail)       [address] [place], [place]  [address]       Ph: [phone] or [phone]       Fax: [phone]   RxID:   [id number]

## 2010-10-02 NOTE — Progress Notes (Signed)
Summary: Med not helping  Phone Note Call from Patient Call back at Home Phone [phone]   Caller: Patient Call For: [name] MD Summary of Call: pt calls and states that the Prevacid is not helping her stomach and you told her if not then you would do some labwork. Would like to come tomorrow for this. Need order  Initial call taken by: Kathlene November,  November 01, 2009 12:53 PM  Follow-up for Phone Call        I put orders in. Can you hit sign and print on your end. Thanks Follow-up by: [name] MD,  November 01, 2009 1:43 PM

## 2010-10-02 NOTE — Letter (Signed)
Summary: Letter to Patient with [place] Results/[facility]  Letter to Patient with [place] Results/[facility]   Imported By: Lanelle Bal 03/21/2010 12:22:22  _____________________________________________________________________  External Attachment:    Type:   Image     Comment:   External Document

## 2010-10-02 NOTE — Assessment & Plan Note (Signed)
Summary: Sinusitis   Vital Signs:  Patient profile:   65 year old female Height:      64 inches Weight:      240 pounds O2 Sat:      93 % on Room air Temp:     98.2 degrees F oral Pulse rate:   93 / minute BP sitting:   132 / 72  (left arm) Cuff size:   large  Vitals Entered By: Kathlene November (November 30, 2009 11:30 AM)  O2 Flow:  Room air CC: cough, head and chest congestion, alot of drainage for 2 days   Primary Care Provider:  [name], MD  CC:  cough, head and chest congestion, and alot of drainage for 2 days.  History of Present Illness: Had PFTs done yesterday. I dont'have her report back yesterday.  Feels the sinus drianage and phlegm is worse.  No fever.  Using her nasal spray.  Never stopped inhalers for the PFTs.  FAcial pressure, sincue drainage, and HA.  Feels she has gotten worse since I saw her a week ago.   Current Medications (verified): 1)  Toprol Xl 100 Mg Xr24h-Tab (Metoprolol Succinate) .... Take 1 Tablet By Mouth Once A Day 2)  Lovaza 1 Gm Caps (Omega-3-Acid Ethyl Esters) .... 4 Tabs By Mouth Daily 3)  Calcium Antacid Ultra 1000 Mg Chew (Calcium Carbonate Antacid) .... Take 1 Tablet By Mouth Once A Day 4)  Magnesium Oxide 400 Mg Tabs (Magnesium Oxide) .... 2 Tabs By Mouth Once A Day 5)  Red Yest Rice .Marland Kitchen.. 2 Capsules A Day 6)  Lunesta 3 Mg Tabs (Eszopiclone) .... One Tablet By Mouth At At Bedtime 7)  Flunisolide 0.025 % Soln (Flunisolide) .... 2 Sprays in Each Nostril Daily 8)  Triamterene-Hctz 37.5-25 Mg Tabs (Triamterene-Hctz) .... Take 1 Tablet By Mouth Once A Day 9)  Emsam 9 Mg/24hr Pt24 (Selegiline) .... Apply  Patch Daily 10)  Ranitidine Hcl 150 Mg Tabs (Ranitidine Hcl) .... Take 1 Tablet By Mouth Two Times A Day 11)  Combivent 18-103 Mcg/act Aero (Ipratropium-Albuterol) .Marland Kitchen.. 1 Puff Inhaled Qid 12)  Clobetasol Propionate 0.05 % Crea (Clobetasol Propionate) .... Applhy Once Daily To Affected Area. 13)  Cosamin Asu .... Joint Health  Supplement. 14)  Symbicort 160-4.5 Mcg/act Aero (Budesonide-Formoterol Fumarate) .... 2 Puffs Inhaled Two Times A Day 15)  Epipen 2-Pak 0.3 Mg/0.66ml Devi (Epinephrine) .Marland Kitchen.. 1 Injection [place] As Needed Allergic Reaction or Beestin. 16)  Ranitidine Hcl 150 Mg Tabs (Ranitidine Hcl) .... Take 1 Tablet By Mouth Two Times A Day  Allergies (verified): 1)  ! Bentyl 2)  ! * Antispasmatics 3)  ! * Bee Stings 4)  Omeprazole (Omeprazole) 5)  Wellbutrin (Bupropion Hcl)  Comments:  Nurse/Medical Assistant: The patient's medications and allergies were reviewed with the patient and were updated in the Medication and Allergy Lists. Kathlene November (November 30, 2009 11:31 AM)  Physical Exam  General:  Well-developed,well-nourished,in no acute distress; alert,appropriate and cooperative throughout examination Head:  Normocephalic and atraumatic without obvious abnormalities. No apparent alopecia or balding. Maxillary sinus tenderness bilat.  Eyes:  No corneal or conjunctival inflammation noted. EOMI. Perrla. Ears:  External ear exam shows no significant lesions or deformities.  Otoscopic examination reveals clear canals, tympanic membranes are intact bilaterally without bulging, retraction, inflammation or discharge. Hearing is grossly normal bilaterally. Nose:  External nasal examination shows no deformity or inflammation.  Mouth:  Oral mucosa and oropharynx without lesions or exudates.  Teeth in good repair. Neck:  No  deformities, masses, or tenderness noted. Lungs:  Normal respiratory effort, chest expands symmetrically. Lungs are clear to auscultation, no crackles or wheezes. Heart:  Normal rate and regular rhythm. S1 and S2 normal without gallop, murmur, click, rub or other extra sounds. Pulses:  Radial 2+  Skin:  no rashes.   Cervical Nodes:  No lymphadenopathy noted Psych:  Cognition and judgment appear intact. Alert and cooperative with normal attention span and concentration. No apparent delusions,  illusions, hallucinations   Impression & Recommendations:  Problem # 1:  SINUSITIS - ACUTE-NOS (ICD-461.9)  Her updated medication list for this problem includes:    Flunisolide 0.025 % Soln (Flunisolide) .Marland Kitchen... 2 sprays in each nostril daily    Augmentin 875-125 Mg Tabs (Amoxicillin-pot clavulanate) .Marland Kitchen... Take 1 tablet by mouth two times a day for 14 days.  Instructed on treatment. Call if symptoms persist or worsen. Complete ABX adn make sure continuing to use allergic nasal spray.   Complete Medication List: 1)  Toprol Xl 100 Mg Xr24h-tab (Metoprolol succinate) .... Take 1 tablet by mouth once a day 2)  Lovaza 1 Gm Caps (Omega-3-acid ethyl esters) .... 4 tabs by mouth daily 3)  Calcium Antacid Ultra 1000 Mg Chew (Calcium carbonate antacid) .... Take 1 tablet by mouth once a day 4)  Magnesium Oxide 400 Mg Tabs (Magnesium oxide) .... 2 tabs by mouth once a day 5)  Red Yest Rice  .Marland Kitchen.. 2 capsules a day 6)  Lunesta 3 Mg Tabs (Eszopiclone) .... One tablet by mouth at at bedtime 7)  Flunisolide 0.025 % Soln (Flunisolide) .... 2 sprays in each nostril daily 8)  Triamterene-hctz 37.5-25 Mg Tabs (Triamterene-hctz) .... Take 1 tablet by mouth once a day 9)  Emsam 9 Mg/24hr Pt24 (Selegiline) .... Apply  patch daily 10)  Ranitidine Hcl 150 Mg Tabs (Ranitidine hcl) .... Take 1 tablet by mouth two times a day 11)  Combivent 18-103 Mcg/act Aero (Ipratropium-albuterol) .Marland Kitchen.. 1 puff inhaled qid 12)  Clobetasol Propionate 0.05 % Crea (Clobetasol propionate) .... Applhy once daily to affected area. 13)  Cosamin Asu  .... Joint health supplement. 14)  Symbicort 160-4.5 Mcg/act Aero (Budesonide-formoterol fumarate) .... 2 puffs inhaled two times a day 15)  Epipen 2-pak 0.3 Mg/0.71ml Devi (Epinephrine) .Marland Kitchen.. 1 injection  as needed allergic reaction or beestin. 16)  Ranitidine Hcl 150 Mg Tabs (Ranitidine hcl) .... Take 1 tablet by mouth two times a day 17)  Augmentin 875-125 Mg Tabs (Amoxicillin-pot  clavulanate) .... Take 1 tablet by mouth two times a day for 14 days. Prescriptions: AUGMENTIN 875-125 MG TABS (AMOXICILLIN-POT CLAVULANATE) Take 1 tablet by mouth two times a day for 14 days.  #28 x 0   Entered and Authorized by:   [name] MD   Signed by:   [name] MD on 11/30/2009   Method used:   Electronically to        [facility] [phone]* (retail)       [address] [place], [place]  [address]       Ph: [phone] or [phone]       Fax: [phone]   RxID:   [id number]

## 2010-10-02 NOTE — Assessment & Plan Note (Signed)
Summary: Asthma, Fatigue   Vital Signs:  Patient profile:   66 year old female Height:      64 inches Weight:      241 pounds BMI:     41.52 O2 Sat:      96 % on Room air Temp:     98.2 degrees F oral Pulse rate:   92 / minute BP sitting:   128 / 72  (left arm) Cuff size:   large  Vitals Entered By: Payton Spark CMA (September 13, 2009 3:09 PM)  O2 Flow:  Room air  Serial Vital Signs/Assessments:                                PEF    PreRx  PostRx Time      O2 Sat  O2 Type     L/min  L/min  L/min   By 3:28 PM                       290    290    260     Kim Johnson  Comments: 3:10 PM Peak Flow 280 Yellow Zone By: Payton Spark CMA  3:28 PM Pt in yellow zone on meter By: Kathlene November   CC: Asthma   Primary Care Provider:  [name], MD  CC:  Asthma.  History of Present Illness: Has been using nher inhalers regularly but still feels SOB.  Has been excessively fatigued.  Using albuterol about twice a day. Used her pulmicort once in the last 3 days.  No wheezing.  No CP or diaphoresis. Thinks the recent cold weather may be exacerbating her breathing. Was recently seen in urgente care for cough. Says her cough is a little better.  Nonproductive overall.   Allergies: 1)  ! Bentyl 2)  ! * Antispasmatics 3)  ! * Bee Stings 4)  Omeprazole (Omeprazole) 5)  Wellbutrin (Bupropion Hcl)  Physical Exam  General:  Well-developed,well-nourished,in no acute distress; alert,appropriate and cooperative throughout examination Head:  Normocephalic and atraumatic without obvious abnormalities. No apparent alopecia or balding. Eyes:  No corneal or conjunctival inflammation noted. EOMI. Perrla. Ears:  External ear exam shows no significant lesions or deformities.  Otoscopic examination reveals clear canals, tympanic membranes are intact bilaterally without bulging, retraction, inflammation or discharge. Hearing is grossly normal bilaterally. Nose:  External nasal examination  shows no deformity or inflammation.  Mouth:  Oral mucosa and oropharynx without lesions or exudates.  Teeth in good repair. Neck:  No deformities, masses, or tenderness noted. Lungs:  Normal respiratory effort, chest expands symmetrically. Lungs are clear to auscultation, no crackles or wheezes. Heart:  Normal rate and regular rhythm. S1 and S2 normal without gallop, murmur, click, rub or other extra sounds. Skin:  no rashes.   Psych:  Cognition and judgment appear intact. Alert and cooperative with normal attention span and concentration. No apparent delusions, illusions, hallucinations   Impression & Recommendations:  Problem # 1:  ASTHMA (ICD-493.90) IN the yellow zone. Given neb tx and still in the yellow. Will get CXR to rule out PNA or bronchitis thought exam is really clear.  Start using her pulmicort two times a day for one week and her albuterol two times a day for one week and if not getting better then let me know. Try to stay out of the cold as it is a known trigger for bronchospasm.  Her  updated medication list for this problem includes:    Pulmicort 0.5 Mg/6ml Susp (Budesonide) .Marland Kitchen... 2ml neb two times a day    Albuterol Sulfate (2.5 Mg/23ml) 0.083% Nebu (Albuterol sulfate) ..... One neb tx every four hours as needed for sob  Orders: Albuterol Sulfate Sol 1mg  unit dose (E4540) Nebulizer Tx (98119) T-Chest x-ray, 2 views (71020) Peak Flow Rate (94150)  Problem # 2:  FATIGUE (ICD-780.79) Will get labs to ruel out anemia, thyroid problems, etc that may be causing her fatigue. Consider also depresion.   Orders: T-CBC w/Diff [id number]) T-TSH [phone]) T-Comprehensive Metabolic Panel [id number]) T-Vitamin B12 [id number]) T-Vitamin D (25-Hydroxy) [phone]) T-Iron [id number]) T-Iron Binding Capacity (TIBC) ([address])  Complete Medication List: 1)  Toprol Xl 100 Mg Xr24h-tab (Metoprolol succinate) .... Take 1 tablet by mouth once a day 2)  Lovaza 1 Gm Caps  (Omega-3-acid ethyl esters) .... 4 tabs by mouth daily 3)  Calcium Antacid Ultra 1000 Mg Chew (Calcium carbonate antacid) .... Take 1 tablet by mouth once a day 4)  Magnesium Oxide 400 Mg Tabs (Magnesium oxide) .... 2 tabs by mouth once a day 5)  Pulmicort 0.5 Mg/92ml Susp (Budesonide) .... 2ml neb two times a day 6)  B Complex 100 Tabs (B complex vitamins) .... One tablet by mouth once daily 7)  Red Yest Rice  .Marland Kitchen.. 2 capsules a day 8)  Clonazepam 0.5 Mg Tabs (Clonazepam) .... Take 1 tablet by mouth three times a day as needed 9)  Lunesta 3 Mg Tabs (Eszopiclone) .... One tablet by mouth at at bedtime 10)  Flunisolide 0.025 % Soln (Flunisolide) .... 2 sprays in each nostril daily 11)  Albuterol Sulfate (2.5 Mg/66ml) 0.083% Nebu (Albuterol sulfate) .... One neb tx every four hours as needed for sob 12)  Triamterene-hctz 37.5-25 Mg Tabs (Triamterene-hctz) .... Take 1 tablet by mouth once a day 13)  Emsam 9 Mg/24hr Pt24 (Selegiline) .... Apply  patch daily 14)  Aspirin 81 Mg Tabs (Aspirin) .... Two-three tablets by mouth daily 15)  Multivitamins Tabs (Multiple vitamin) .... One tablet by mouth once daily 16)  Clobetasol Propionate 0.05 % Oint (Clobetasol propionate) .... Apply once daily to affected area 17)  Fluticasone Propionate 50 Mcg/act Susp (Fluticasone propionate) .... 2 sprays in each nostril once daily 18)  Nasacort Aq 55 Mcg/act Aers (Triamcinolone acetonide(nasal)) .... 2 sprays in each nostril once daily 19)  Abilify 10 Mg Tabs (Aripiprazole) .... Take 1 tablet by mouth once a day 20)  Ranitidine Hcl 150 Mg Tabs (Ranitidine hcl) .... Take 1 tablet by mouth two times a day 21)  Zantac 150 Mg Caps (Ranitidine hcl)   Medication Administration  Medication # 1:    Medication: Albuterol Sulfate Sol 1mg  unit dose    Diagnosis: ASTHMA (ICD-493.90)    Dose: 2.5mg     Route: inhaled    Exp Date: 11/01/2010    Lot #: [id number]    Mfr: Nephron    Patient tolerated medication without  complications    Given by: Kathlene November (September 13, 2009 3:20 PM)  Orders Added: 1)  T-CBC w/Diff [[id number]] 2)  T-TSH [[id number]] 3)  T-Comprehensive Metabolic Panel [[id number]] 4)  T-Vitamin B12 [82607-23330] 5)  T-Vitamin D (25-Hydroxy) [31517-61607] 6)  T-Iron [37106-26948] 7)  T-Iron Binding Capacity (TIBC) [[address]] 8)  Albuterol Sulfate Sol 1mg  unit dose [J7613] 9)  Nebulizer Tx [94640] 10)  T-Chest x-ray, 2 views [71020] 11)  Est. Patient Level IV [09381] 12)  Peak Flow Rate [94150]

## 2010-10-02 NOTE — Progress Notes (Signed)
Summary: Nausea med not working  Phone Note Call from Patient   Caller: Patient Call For: Sandford Craze Summary of Call: Pt calls and states that the Odansetron given to her for nausea isn't helping and wanted to know if something else could be sent for her. She has her GI appointment next Wednesday Initial call taken by: Kathlene November,  February 15, 2010 7:51 AM  Follow-up for Phone Call        Called GI- rescheduled apt with GI tomorrow 6/17 at 10 AM.  Please call patient and notify her of this earlier appointment. Follow-up by: Lemont Fillers FNP,  February 15, 2010 8:40 AM  Additional Follow-up for Phone Call Additional follow up Details #1::        Notified pt of earlier appointment Additional Follow-up by: Kathlene November,  February 15, 2010 8:55 AM     Appended Document: Nausea med not working Pt [facility] stating she was really sorry that she went behind your back and scheduled her own GI apt. Pt says she feels really bad and hopes it doesn't cause any problems.  Appended Document: Nausea med not working Spoke with patient- she has an appointment on 6/22 with Dr. Jenkins Rouge ([facility])- she cancelled apt with [facility].

## 2010-10-02 NOTE — Progress Notes (Signed)
Summary: pt request a order for Mammogram   Phone Note Call from Patient   Caller: Patient Summary of Call: Pt called and is having  Lt. sided breast pain and would like to have a mammogram done downstairs here in Imaging... Please Fax a order to them and let her know when this has been completed, Call [phone] Initial call taken by: Michaelle Copas,  July 16, 2010 9:28 AM  Follow-up for Phone Call        If having breast pain then has to have a diagnositic mammogram and will have to go to [facility] or [place]. Which does she prefer?  Follow-up by: [name] MD,  July 16, 2010 9:56 AM  Additional Follow-up for Phone Call Additional follow up Details #1::        pt prefers [facility] so we will schedule and call her. Additional Follow-up by: Avon Gully CMA, Duncan Dull),  July 16, 2010 3:52 PM  New Problems: BREAST PAIN (ICD-611.71)   Additional Follow-up for Phone Call Additional follow up Details #2::    appt scheduled for 07/24/10 @1030  at the breast clinin in [place]. left detailed message on pt vm Follow-up by: Avon Gully CMA, Duncan Dull),  July 18, 2010 2:42 PM  New Problems: BREAST PAIN (ICD-611.71)

## 2010-10-02 NOTE — Progress Notes (Signed)
Summary: low sodium  Phone Note Call from Patient   Caller: Patient Call For: [name] MD Summary of Call: Pt states sodium level down at 125- this was checked at ED yesterday because her psych put on increased dose of med and had interaction with it and they found the sodium level down again. Please advise They suggested she take salt pills but she doesn't know how many a day she should take Initial call taken by: Kathlene November LPN,  May 30, 2010 8:54 AM  Follow-up for Phone Call        Start wtih 3 a day. One with each meal and lets recheck sodium on Friday. Make sure she didn't restart her fluid pill.  Follow-up by: [name] MD,  May 30, 2010 9:17 AM  Additional Follow-up for Phone Call Additional follow up Details #1::        Pt notified of MD instructions. Additional Follow-up by: Kathlene November LPN,  May 30, 2010 9:22 AM

## 2010-10-02 NOTE — Assessment & Plan Note (Signed)
Summary: FU COPD   Vital Signs:  Patient profile:   65 year old female Height:      64 inches Weight:      239.75 pounds BMI:     41.30 BP sitting:   110 / 78  (right arm) Cuff size:   large  Vitals Entered By: Mervin Kung CMA (November 24, 2009 11:28 AM) CC: room 6  Follow up. Pt states she still feels like she has sinus infection. Still notes difficulty to take deep breaths.   Primary Care Provider:  [name], MD  CC:  room 6  Follow up. Pt states she still feels like she has sinus infection. Still notes difficulty to take deep breaths..  History of Present Illness: room 6  Follow up. Pt states she still feels like she has sinus infection. Still notes difficulty to take deep breaths. Stil havig alot of sinus drainage and phlegm.  No facial pressure or HA.  NO fever.  hasn't been using her nasal spray. Has been to see ENT multiple times in the past.  We called adn got records form [facility] at the last OV but they didn't have PFTs on recor. They did send [place] her chest CT to rule ou PE last january (2010). She is due for repeat Chest Ct for hilar LNs and hasn't had this done.l   Allergies: 1)  ! Bentyl 2)  ! * Antispasmatics 3)  ! * Bee Stings 4)  Omeprazole (Omeprazole) 5)  Wellbutrin (Bupropion Hcl)  Physical Exam  General:  Well-developed,well-nourished,in no acute distress; alert,appropriate and cooperative throughout examination Head:  Normocephalic and atraumatic without obvious abnormalities. No apparent alopecia or balding. Lungs:  Normal respiratory effort, chest expands symmetrically. Lungs are clear to auscultation, no crackles or wheezes. Heart:  Normal rate and regular rhythm. S1 and S2 normal without gallop, murmur, click, rub or other extra sounds. Skin:  no rashes.   Psych:  Cognition and judgment appear intact. Alert and cooperative with normal attention span and concentration. No apparent delusions, illusions, hallucinations   Impression &  Recommendations:  Problem # 1:  COPD (ICD-496) Assessment Unchanged  Overall improved somebut she is still symptomatic.  .  I still need to get PFTs on her.  She will try to call [facility] herself and see if can get a copy.  If not then will need to schedule because I really dont' have a official dx of either asthma or COPD for her. Her CXR shows evidence of emphysema.  Reviewed to continue the symbicort 2 puffs inhaled two times a day and not uses the Pulmicort NEB. I still think she is a litte confused about how to use her inhaler and nebs so reviewd this again today.  The following medications were removed from the medication list:    Pulmicort 0.5 Mg/64ml Susp (Budesonide) .Marland Kitchen... 2ml neb two times a day    Albuterol Sulfate (2.5 Mg/39ml) 0.083% Nebu (Albuterol sulfate) ..... One neb tx every four hours as needed for sob Her updated medication list for this problem includes:    Combivent 18-103 Mcg/act Aero (Ipratropium-albuterol) .Marland Kitchen... 1 puff inhaled qid    Symbicort 160-4.5 Mcg/act Aero (Budesonide-formoterol fumarate) .Marland Kitchen... 2 puffs inhaled two times a day  Orders: PFT Baseline-Pre/Post Bronchodiolator (PFT Baseline-Pre/Pos)  Problem # 2:  ALLERGIC RHINITIS CAUSE UNSPECIFIED (ICD-477.9) explained I don't think she has a sinus infection, she just has chronic nasal drainge and it may be worse because of recent increase in pollen as we enter the  Spring season.  Needs to restart her nasal spray.  Feels the netti-pot doesn't really help. Discussed referral to ENT if not improving. She will think about it.  She is worried about the cost.  Her updated medication list for this problem includes:    Flunisolide 0.025 % Soln (Flunisolide) .Marland Kitchen... 2 sprays in each nostril daily  Complete Medication List: 1)  Toprol Xl 100 Mg Xr24h-tab (Metoprolol succinate) .... Take 1 tablet by mouth once a day 2)  Lovaza 1 Gm Caps (Omega-3-acid ethyl esters) .... 4 tabs by mouth daily 3)  Calcium Antacid Ultra 1000 Mg Chew  (Calcium carbonate antacid) .... Take 1 tablet by mouth once a day 4)  Magnesium Oxide 400 Mg Tabs (Magnesium oxide) .... 2 tabs by mouth once a day 5)  Red Yest Rice  .Marland Kitchen.. 2 capsules a day 6)  Lunesta 3 Mg Tabs (Eszopiclone) .... One tablet by mouth at at bedtime 7)  Flunisolide 0.025 % Soln (Flunisolide) .... 2 sprays in each nostril daily 8)  Triamterene-hctz 37.5-25 Mg Tabs (Triamterene-hctz) .... Take 1 tablet by mouth once a day 9)  Emsam 9 Mg/24hr Pt24 (Selegiline) .... Apply  patch daily 10)  Ranitidine Hcl 150 Mg Tabs (Ranitidine hcl) .... Take 1 tablet by mouth two times a day 11)  Combivent 18-103 Mcg/act Aero (Ipratropium-albuterol) .Marland Kitchen.. 1 puff inhaled qid 12)  Clobetasol Propionate 0.05 % Crea (Clobetasol propionate) .... Applhy once daily to affected area. 13)  Cosamin Asu  .... Joint health supplement. 14)  Symbicort 160-4.5 Mcg/act Aero (Budesonide-formoterol fumarate) .... 2 puffs inhaled two times a day 15)  Epipen 2-pak 0.3 Mg/0.7ml Devi (Epinephrine) .Marland Kitchen.. 1 injection [place] as needed allergic reaction or beestin. 16)  Ranitidine Hcl 150 Mg Tabs (Ranitidine hcl) .... Take 1 tablet by mouth two times a day  Other Orders: T-CT Chest w/o CM ([address])  Patient Instructions: 1)  think about seeing ENT again.  2)  We will schedule your CT of your chest.   3)  Make sure using the symbicort 2 puffs inhaled two times a day and restart your nasal spray.   Current Allergies (reviewed today): ! BENTYL ! * ANTISPASMATICS ! * BEE STINGS OMEPRAZOLE (OMEPRAZOLE) WELLBUTRIN (BUPROPION HCL)

## 2010-10-02 NOTE — Assessment & Plan Note (Signed)
Summary: sick on her stomach- jr   Vital Signs:  Patient profile:   65 year old female Height:      64 inches Weight:      231 pounds Temp:     97.6 degrees F oral Pulse rate:   77 / minute BP sitting:   142 / 82  (left arm) Cuff size:   large  Vitals Entered By: Kathlene November (February 01, 2010 1:46 PM) CC: nausea daily for 2 1/2 weeks- denies any abdominal pain- no appetite   Primary Care Provider:  [name], MD  CC:  nausea daily for 2 1/2 weeks- denies any abdominal pain- no appetite.  History of Present Illness: Ms Riso is a 65 year old female who presents today with 2 1/2 week history of nausea.  Notes that nausea is worst in the mornings.  She denies associated pain.  Nausea is worsened by food early in the morning.  Denies associated vomitting.  BM's have been variable- occasional diarrhea, some days no BM, some days mild constipation.  Denies black stools (except with pepto bismol) denies tarry stools or BRBPR.  Pt reports that she is s/p cholecystectomy. Denies fevers.   She denies frequent use of NSAIDS.  Takes ranitidine- GERD symptoms are not optimally controlled.    Current Medications (verified): 1)  Toprol Xl 100 Mg Xr24h-Tab (Metoprolol Succinate) .... Take 1 Tablet By Mouth Once A Day 2)  Lovaza 1 Gm Caps (Omega-3-Acid Ethyl Esters) .... 4 Tabs By Mouth Daily 3)  Magnesium Oxide 400 Mg Tabs (Magnesium Oxide) .... 2 Tabs By Mouth Once A Day 4)  Red Yest Rice .Marland Kitchen.. 2 Capsules A Day 5)  Lunesta 3 Mg Tabs (Eszopiclone) .... One Tablet By Mouth At At Bedtime 6)  Flunisolide 0.025 % Soln (Flunisolide) .... 2 Sprays in Each Nostril Daily 7)  Triamterene-Hctz 37.5-25 Mg Tabs (Triamterene-Hctz) .... Take 1 Tablet By Mouth Once A Day 8)  Ranitidine Hcl 150 Mg Tabs (Ranitidine Hcl) .... Take 1 Tablet By Mouth Two Times A Day 9)  Combivent 18-103 Mcg/act Aero (Ipratropium-Albuterol) .Marland Kitchen.. 1 Puff Inhaled Qid 10)  Clobetasol Propionate 0.05 % Crea (Clobetasol Propionate)  .... Applhy Once Daily To Affected Area. 11)  Cosamin Asu .... Joint Health Supplement. 12)  Epipen 2-Pak 0.3 Mg/0.87ml Devi (Epinephrine) .Marland Kitchen.. 1 Injection  As Needed Allergic Reaction or Beestin. 13)  Ranitidine Hcl 150 Mg Tabs (Ranitidine Hcl) .... Take 1 Tablet By Mouth Two Times A Day  Allergies (verified): 1)  ! Bentyl 2)  ! * Antispasmatics 3)  ! * Bee Stings 4)  Omeprazole (Omeprazole) 5)  Wellbutrin (Bupropion Hcl)  Comments:  Nurse/Medical Assistant: The patient's medications and allergies were reviewed with the patient and were updated in the Medication and Allergy Lists. Kathlene November (February 01, 2010 1:47 PM)  Physical Exam  General:  Well-developed,well-nourished,in no acute distress; alert,appropriate and cooperative throughout examination Lungs:  Normal respiratory effort, chest expands symmetrically. Lungs are clear to auscultation, no crackles or wheezes. Heart:  Normal rate and regular rhythm. S1 and S2 normal without gallop, murmur, click, rub or other extra sounds. Abdomen:  Bowel sounds positive,abdomen soft and non-tender without masses, organomegaly or hernias noted.   Impression & Recommendations:  Problem # 1:  GERD (ICD-530.81) Assessment Deteriorated I suspect that patient's symptoms of nausea are related to worsening GERD.  She has had similar sxs in the past which included EGD performed at [facility] by Dr. Jarold Motto (esophagitis, gastritis, HH).  Will give  her another try of a PPI in place of ranitidine.  + report of GI upset with omeprazole.  However pt was taking this med for GI upset so it is hard to blame PPI as culprit.  Pt instructe to call if sxs worsen.  Also given rx for as needed zofran, baseline laboratories were ordered.  The following medications were removed from the medication list:    Calcium Antacid Ultra 1000 Mg Chew (Calcium carbonate antacid) .Marland Kitchen... Take 1 tablet by mouth once a day Her updated medication list for this problem includes:     Magnesium Oxide 400 Mg Tabs (Magnesium oxide) .Marland Kitchen... 2 tabs by mouth once a day    Ranitidine Hcl 150 Mg Tabs (Ranitidine hcl) .Marland Kitchen... Take 1 tablet by mouth two times a day    Dexilant 60 Mg Cpdr (Dexlansoprazole) ..... One tablet by mouth daily  Complete Medication List: 1)  Toprol Xl 100 Mg Xr24h-tab (Metoprolol succinate) .... Take 1 tablet by mouth once a day 2)  Lovaza 1 Gm Caps (Omega-3-acid ethyl esters) .... 4 tabs by mouth daily 3)  Magnesium Oxide 400 Mg Tabs (Magnesium oxide) .... 2 tabs by mouth once a day 4)  Red Yest Rice  .Marland Kitchen.. 2 capsules a day 5)  Lunesta 3 Mg Tabs (Eszopiclone) .... One tablet by mouth at at bedtime 6)  Flunisolide 0.025 % Soln (Flunisolide) .... 2 sprays in each nostril daily 7)  Triamterene-hctz 37.5-25 Mg Tabs (Triamterene-hctz) .... Take 1 tablet by mouth once a day 8)  Ranitidine Hcl 150 Mg Tabs (Ranitidine hcl) .... Take 1 tablet by mouth two times a day 9)  Combivent 18-103 Mcg/act Aero (Ipratropium-albuterol) .Marland Kitchen.. 1 puff inhaled qid 10)  Clobetasol Propionate 0.05 % Crea (Clobetasol propionate) .... Applhy once daily to affected area. 11)  Cosamin Asu  .... Joint health supplement. 12)  Epipen 2-pak 0.3 Mg/0.28ml Devi (Epinephrine) .Marland Kitchen.. 1 injection [place] as needed allergic reaction or beestin. 13)  Dexilant 60 Mg Cpdr (Dexlansoprazole) .... One tablet by mouth daily 14)  Zofran 4 Mg Tabs (Ondansetron hcl) .... One tablet by mouth every 8 hours as needed for nausea  Other Orders: T-Liver Profile [id number]) T-Lipase [id number]) T-Amylase [id number]) T-CBC No Diff ([id number]) Prescription Created Electronically [id number])  Patient Instructions: 1)  Stop Ranitidine, start Dexilant. 2)  Complete your labs today. 3)  Call if you develop abdominal pain, black or bloody stools, or inability to keep down food and medicine. 4)  Please schedule a follow-up appointment in 2 weeks. Prescriptions: ZOFRAN 4 MG TABS (ONDANSETRON HCL) one tablet by mouth every  8 hours as needed for nausea  #30 x 0   Entered and Authorized by:   Lemont Fillers FNP   Signed by:   Lemont Fillers FNP on 02/01/2010   Method used:   [facility]ically to        [facility] [phone]* (retail)       [address] [place], [place]  [address]       Ph: [phone] or [phone]       Fax: [phone]   RxID:   [id number] DEXILANT 60 MG CPDR (DEXLANSOPRAZOLE) one tablet by mouth daily  #30 x 0   Entered and Authorized by:   Lemont Fillers FNP   Signed by:   Lemont Fillers FNP on 02/01/2010   Method used:   [facility]ically to        [facility]  [address] (retail)       [address] [place], [place]  [address]       Ph: [phone] or [phone]       Fax: [phone]   RxID:   [id number]

## 2010-10-02 NOTE — Progress Notes (Signed)
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 RANITIDINE HCL 150 MG TABS (RANITIDINE HCL) Take 1 tablet by mouth two times a day Prescriptions: RANITIDINE HCL 150 MG TABS (RANITIDINE HCL) Take 1 tablet by mouth two times a day  #60 x 5   Entered and Authorized by:   [name] MD   Signed by:   [name] MD on 11/20/2009   Method used:   [facility] to        [facility] [phone]* (retail)       [address] [place], [place]  [address]       Ph: [phone] or [phone]       Fax: [phone]   RxID:   [id number] EPIPEN 2-PAK 0.3 MG/0.3ML DEVI (EPINEPHRINE) 1 injection [place] as needed allergic reaction or beestin.  #1 x 0   Entered and Authorized by:   [name] MD   Signed by:   [name] MD on 11/20/2009   Method used:   [facility]ronically to        [facility] [phone]* (retail)       [address] [place], [place]  [address]       Ph: [phone] or [phone]       Fax: [phone]   RxID:   [id number]

## 2010-10-04 NOTE — Progress Notes (Signed)
Summary: Neb med refill  Phone Note Call from Patient Call back at Home Phone [phone]   Caller: Patient Call For: [name] MD Summary of Call: Pt uses in her nebulizer budesonide  0.5mg /65ml comes with 6 pouches in a box. Do not see this on her med list but pt requesting a refill of this  [facility] [address] [facility] [place]. Would like 6 boxes sent Initial call taken by: Kathlene November LPN,  August 14, 2010 11:28 AM    New/Updated Medications: BUDESONIDE 0.5 MG/2ML SUSP (BUDESONIDE) one neb inhaled two times a day Prescriptions: BUDESONIDE 0.5 MG/2ML SUSP (BUDESONIDE) one neb inhaled two times a day  #90 day supp x 1   Entered and Authorized by:   [name] MD   Signed by:   [name] MD on 08/14/2010   Method used:   [facility]ronically to        [facility] [phone][facility] (retail)       [address] [place], [place]  [address]       Ph: [phone] or [phone]       Fax: [phone]   RxID:   [id number]

## 2010-10-04 NOTE — Assessment & Plan Note (Signed)
Summary: Sinusitis, etc   Vital Signs:  Patient profile:   65 year old female Height:      64 inches Weight:      205 pounds O2 Sat:      96 % on Room air Temp:     97.4 degrees F oral Pulse rate:   78 / minute BP sitting:   118 / 72  (right arm) Cuff size:   large  Vitals Entered By: Avon Gully CMA, (AAMA) (September 05, 2010 11:22 AM)  O2 Flow:  Room air  Serial Vital Signs/Assessments:  Comments: 11:23 AM pre medication peak flow; 270, 270, 270 By: Avon Gully CMA, (AAMA)  pt is in the yellow zone By: Avon Gully CMA, Duncan Dull)    Primary Care Provider:  [name], MD   History of Present Illness: had to go the the Emergency a week ago and then went to [facility]. Dx with bronchitis. She has prior to this taken 4 days of augmentin which she had stopped. Seh was also on steroids priot to this.  Over she is better but still some wheezing. She is using her combivent two times a day. Says it uses it more than that it make sher too shakey. No fever. Some cough (occ produtive). Still with nasal congestion, HA.  Has been using Sudafed and says ti down provide relief when she uses it.   Still having some pressure in the middle of the chest. Says thinks it is reflux but takes her ranitidine. Says it bothered her last ngith.  Had a normal EKG in the ED. Using her NEBs two times a day.    Current Medications (verified): 1)  Toprol Xl 100 Mg Xr24h-Tab (Metoprolol Succinate) .... Take 1 Tablet By Mouth Once A Day 2)  Lovaza 1 Gm Caps (Omega-3-Acid Ethyl Esters) .... 4 Tabs By Mouth Daily 3)  Lunesta 3 Mg Tabs (Eszopiclone) .... One Tablet By Mouth At At Bedtime 4)  Flunisolide 0.025 % Soln (Flunisolide) .... 2 Sprays in Each Nostril Daily 5)  Nexium 40 Mg Cpdr (Esomeprazole Magnesium) .... Take 1 Tablet By Mouth Once A Day 6)  Clobetasol Propionate 0.05 % Crea (Clobetasol Propionate) .... Applhy Once Daily To Affected Area. 7)  Cosamin Asu .... Joint Health  Supplement. 8)  Epipen 2-Pak 0.3 Mg/0.70ml Devi (Epinephrine) .Marland Kitchen.. 1 Injection  As Needed Allergic Reaction or Beestin. 9)  Losartan Potassium 25 Mg Tabs (Losartan Potassium) .... Take 1 Tablet By Mouth Once A Day 10)  Flovent Hfa 110 Mcg/act Aero (Fluticasone Propionate  Hfa) .... One Puff Inhaled Two Times A Day 11)  Tessalon 200 Mg Caps (Benzonatate) .Marland Kitchen.. 1 By Mouth Three Times A Day As Needed Cough 12)  Duoneb 0.5-2.5 (3) Mg/70ml Soln (Ipratropium-Albuterol) .... Use in [employer] Twice A Day As Needed  Allergies (verified): 1)  ! Bentyl 2)  ! * Antispasmatics 3)  ! * Bee Stings 4)  Omeprazole (Omeprazole) 5)  Wellbutrin (Bupropion Hcl)  Comments:  Nurse/Medical Assistant: The patient's medications and allergies were reviewed with the patient and were updated in the Medication and Allergy Lists. Avon Gully CMA, Duncan Dull) (September 05, 2010 11:25 AM)  Physical Exam  General:  Well-developed,well-nourished,in no acute distress; alert,appropriate and cooperative throughout examination Head:  Normocephalic and atraumatic without obvious abnormalities. No apparent alopecia or balding. Eyes:  No corneal or conjunctival inflammation noted. EOMI. Perrla. Ears:  External ear exam shows no significant lesions or deformities.  Otoscopic examination reveals clear canals, tympanic membranes are  intact bilaterally without bulging, retraction, inflammation or discharge. Hearing is grossly normal bilaterally. Nose:  External nasal examination shows no deformity or inflammation. Nasal mucosa are pink and moist without lesions or exudates. Mouth:  O with some mild cobblestoning.  Neck:  No deformities, masses, or tenderness noted. Lungs:  Normal respiratory effort, chest expands symmetrically. Lungs are clear to auscultation, no crackles or wheezes. Heart:  Normal rate and regular rhythm. S1 and S2 normal without gallop, murmur, click, rub or other extra sounds. Skin:  no rashes.    Cervical Nodes:  No lymphadenopathy noted Psych:  Cognition and judgment appear intact. Alert and cooperative with normal attention span and concentration. No apparent delusions, illusions, hallucinations   Impression & Recommendations:  Problem # 1:  SINUSITIS - ACUTE-NOS (ICD-461.9)  Her updated medication list for this problem includes:    Flunisolide 0.025 % Soln (Flunisolide) .Marland Kitchen... 2 sprays in each nostril daily    Tessalon 200 Mg Caps (Benzonatate) .Marland Kitchen... 1 by mouth three times a day as needed cough    Augmentin 875-125 Mg Tabs (Amoxicillin-pot clavulanate) .Marland Kitchen... Take 1 tablet by mouth two times a day for 10 days  Instructed on treatment. Call if symptoms persist or worsen.   Problem # 2:  BRONCHITIS, ACUTE WITH BRONCHOSPASM (ICD-466.0) Expaline that we dont' use ABX for bronchits and overall her respiratory sxs are better.  The following medications were removed from the medication list:    Combivent 18-103 Mcg/act Aero (Ipratropium-albuterol) .Marland Kitchen... 1 puff inhaled qid    Budesonide 0.5 Mg/48ml Susp (Budesonide) ..... One neb inhaled two times a day Her updated medication list for this problem includes:    Flovent Hfa 110 Mcg/act Aero (Fluticasone propionate  hfa) ..... One puff inhaled two times a day    Tessalon 200 Mg Caps (Benzonatate) .Marland Kitchen... 1 by mouth three times a day as needed cough    Duoneb 0.5-2.5 (3) Mg/73ml Soln (Ipratropium-albuterol) ..... Use in nebulizer machine twice a day as needed    Augmentin 875-125 Mg Tabs (Amoxicillin-pot clavulanate) .Marland Kitchen... Take 1 tablet by mouth two times a day for 10 days    Proair Hfa 108 (90 Base) Mcg/act Aers (Albuterol sulfate) .Marland Kitchen... 2-4 puffs inhaled very 4-6 hours as needed  Problem # 3:  NAUSEA (ICD-787.02) Has resolved.   Problem # 4:  CHEST PAIN, ATYPICAL (ICD-786.59) Since EKG etc were normal in the ED I suspect that she is having worsening of her GERD. Gave her some nexium to try for a few days. She doens't want to see GI as she  had seen them several times in the past year for nausea.    Complete Medication List: 1)  Toprol Xl 100 Mg Xr24h-tab (Metoprolol succinate) .... Take 1 tablet by mouth once a day 2)  Lovaza 1 Gm Caps (Omega-3-acid ethyl esters) .... 4 tabs by mouth daily 3)  Lunesta 3 Mg Tabs (Eszopiclone) .... One tablet by mouth at at bedtime 4)  Flunisolide 0.025 % Soln (Flunisolide) .... 2 sprays in each nostril daily 5)  Cosamin Asu  .... Joint health supplement. 6)  Epipen 2-pak 0.3 Mg/0.68ml Devi (Epinephrine) .Marland Kitchen.. 1 injection [place] as needed allergic reaction or beestin. 7)  Losartan Potassium 25 Mg Tabs (Losartan potassium) .... Take 1 tablet by mouth once a day 8)  Flovent Hfa 110 Mcg/act Aero (Fluticasone propionate  hfa) .... One puff inhaled two times a day 9)  Tessalon 200 Mg Caps (Benzonatate) .Marland Kitchen.. 1 by mouth three times a day as needed cough  10)  Duoneb 0.5-2.5 (3) Mg/84ml Soln (Ipratropium-albuterol) .... Use in nebulizer machine twice a day as needed 11)  Augmentin 875-125 Mg Tabs (Amoxicillin-pot clavulanate) .... Take 1 tablet by mouth two times a day for 10 days 12)  Proair Hfa 108 (90 Base) Mcg/act Aers (Albuterol sulfate) .... 2-4 puffs inhaled very 4-6 hours as needed  Patient Instructions: 1)  Call if not better in one week.  Prescriptions: PROAIR HFA 108 (90 BASE) MCG/ACT AERS (ALBUTEROL SULFATE) 2-4 puffs inhaled very 4-6 hours as needed  #11 x 1   Entered and Authorized by:   [name] MD   Signed by:   [name] MD on 09/05/2010   Method used:   [facility] to        [facility] [phone]* (retail)       [address] [place], [place]  [address]       Ph: [phone] or [phone]       Fax: [phone]   RxID:   [id number] DUONEB 0.5-2.5 (3) MG/3ML SOLN (IPRATROPIUM-ALBUTEROL) use in nebulizer machine twice a day as needed  #90 x 1   Entered and Authorized by:   [name] MD   Signed by:   [name] MD on 09/05/2010   Method  used:   Electronically to        [facility] [phone]* (retail)       [address] [place], [place]  [address]       Ph: [phone] or [phone]       Fax: [phone]   RxID:   [id number] AUGMENTIN 875-125 MG TABS (AMOXICILLIN-POT CLAVULANATE) Take 1 tablet by mouth two times a day for 10 days  #20 x 0   Entered and Authorized by:   [name] MD   Signed by:   [name] MD on 09/05/2010   Method used:   Electronically to        [facility] [phone]* (retail)       [address] [place], [place]  [address]       Ph: [phone] or [phone]       Fax: [phone]   RxID:   [id number]    Orders Added: 1)  Est. Patient Level IV [94[address]]  Appended Document: Sinusitis, etc

## 2010-10-04 NOTE — Assessment & Plan Note (Signed)
Summary: COUGH,WHEEZING,RUNNY NOSE,SINUS PROBLEMS,SOB/WSE   Vital Signs:  Patient Profile:   65 Years Old Female CC:      SOB, cough, congestion x 5 days Height:     64 inches Weight:      204 pounds O2 Sat:      94 % O2 treatment:    Room Air Temp:     98.8 degrees F oral Pulse rate:   84 / minute Resp:     18 per minute BP sitting:   119 / 80  (left arm) Cuff size:   large  Pt. in pain?   no  Vitals Entered By: Lajean Saver RN (August 29, 2010 6:57 PM)                   Updated Prior Medication List: TOPROL XL 100 MG XR24H-TAB (METOPROLOL SUCCINATE) Take 1 tablet by mouth once a day LOVAZA 1 GM CAPS (OMEGA-3-ACID ETHYL ESTERS) 4 tabs by mouth daily MAGNESIUM OXIDE 400 MG TABS (MAGNESIUM OXIDE) 2 tabs by mouth once a day LUNESTA 3 MG TABS (ESZOPICLONE) one tablet by mouth at at bedtime FLUNISOLIDE 0.025 % SOLN (FLUNISOLIDE) 2 sprays in each nostril daily NEXIUM 40 MG CPDR (ESOMEPRAZOLE MAGNESIUM) Take 1 tablet by mouth once a day COMBIVENT 18-103 MCG/ACT AERO (IPRATROPIUM-ALBUTEROL) 1 puff inhaled QID CLOBETASOL PROPIONATE 0.05 % CREA (CLOBETASOL PROPIONATE) Applhy once daily to affected area. * COSAMIN ASU joint health supplement. EPIPEN 2-PAK 0.3 MG/0.3ML DEVI (EPINEPHRINE) 1 injection [place] as needed allergic reaction or beestin. TRILEPTAL 300 MG TABS (OXCARBAZEPINE) Takes 2 by mouth two times a day LOSARTAN POTASSIUM 25 MG TABS (LOSARTAN POTASSIUM) Take 1 tablet by mouth once a day FLOVENT HFA 110 MCG/ACT AERO (FLUTICASONE PROPIONATE  HFA) one puff inhaled two times a day SIMVASTATIN 40 MG TABS (SIMVASTATIN) Take 1 tablet by mouth once a day at bedtime BUDESONIDE 0.5 MG/2ML SUSP (BUDESONIDE) one neb inhaled two times a day  Current Allergies (reviewed today): ! BENTYL ! * ANTISPASMATICS ! * BEE STINGS OMEPRAZOLE (OMEPRAZOLE) WELLBUTRIN (BUPROPION HCL)History of Present Illness Chief Complaint: SOB, cough, congestion x 5 days History of Present Illness: 65 Years  Old Female complains of onset of cold symptoms for 2 weeks.  Deborah [name] has been using her normal asthma medicines which is helping a little bit.  She feels this is mostly due to her asthma.  She was just on Augmentin for a sinus infection but she stopped after 4 days because she was feeling better.  She went to the ER because she had mild CP and was told she had pleurisy that was prednisone-induced.  She has been fairly compliant with her asthma meds but doesn't feel that she is well-controlled, especially when she gets sick and is confused about what to do. No sore throat + cough No pleuritic pain + wheezing No nasal congestion + post-nasal drainage + sinus pain/pressure + chest congestion No itchy/red eyes No earache No hemoptysis + SOB No chills/sweats No fever No nausea No vomiting No abdominal pain No diarrhea No skin rashes No fatigue No myalgias No headache   REVIEW OF SYSTEMS Constitutional Symptoms      Denies fever, chills, night sweats, weight loss, weight gain, and fatigue.  Eyes       Denies change in vision, eye pain, eye discharge, glasses, contact lenses, and eye surgery. Ear/Nose/Throat/Mouth       Complains of sinus problems.      Denies hearing loss/aids, change in hearing, ear pain, ear discharge, dizziness,  frequent runny nose, frequent nose bleeds, sore throat, hoarseness, and tooth pain or bleeding.  Respiratory       Complains of productive cough, wheezing, shortness of breath, and asthma.      Denies dry cough, bronchitis, and emphysema/COPD.  Cardiovascular       Denies murmurs, chest pain, and tires easily with exhertion.    Gastrointestinal       Denies stomach pain, nausea/vomiting, diarrhea, constipation, blood in bowel movements, and indigestion. Genitourniary       Denies painful urination, kidney stones, and loss of urinary control. Neurological       Complains of headaches.      Denies paralysis, seizures, and  fainting/blackouts. Musculoskeletal       Denies muscle pain, joint pain, joint stiffness, decreased range of motion, redness, swelling, muscle weakness, and gout.  Skin       Denies bruising, unusual mles/lumps or sores, and hair/skin or nail changes.  Psych       Denies mood changes, temper/anger issues, anxiety/stress, speech problems, depression, and sleep problems.  Past History:  Past Medical History: Reviewed history from 03/24/2010 and no changes required. BIPOLAR DISORDER UNSPECIFIED (ICD-296.80)-- [name], NP DEPRESSION (ICD-311) -Sees [name].   Hyperlipidemia Hypertension  Past Surgical History: Reviewed history from 10/28/2007 and no changes required. cholecystectomy partial hysterectomy ovarian cystectomy removal of cyst on chest wall  Family History: Reviewed history from 04/03/2009 and no changes required. mother deceased - emphysema father deceased - heart blockage one brother alive and healthy one brother alive and altzheimers Family History of Colon Cancer:Maternal Grandfather   Social History: Reviewed history from 03/23/2008 and no changes required. Retired day Occupational hygienist. Widowed x 1 yr.  Has 5 kids - 1 boy, 4 girls.  Youngest lives w/ her. Never smoked. Denies ETOH. Exercises 3 x a wk. G6P5 Physical Exam General appearance: well developed, well nourished, no acute distress Ears: normal, no lesions or deformities Nasal: clear discharge Oral/Pharynx: clear PND, OP patent, no erythema, no exudates Chest/Lungs: mild wheezing bilateral, no rhonchi Heart: regular rate and  rhythm, no murmur Skin: no obvious rashes or lesions MSE: oriented to time, place, and person post-nebulizer, she feels much better, no wheezing, less fatigue Assessment New Problems: BRONCHITIS, ACUTE WITH BRONCHOSPASM (ICD-466.0)   Patient Education: Patient and/or caregiver instructed in the following: rest, fluids.  Plan New Medications/Changes: DUONEB  0.5-2.5 (3) MG/3ML SOLN (IPRATROPIUM-ALBUTEROL) use in nebulizer machine twice a day as needed  #QS x2 wks x 0, 08/29/2010, Hoyt Koch MD TESSALON 200 MG CAPS (BENZONATATE) 1 by mouth three times a day as needed cough  #21 x 0, 08/29/2010, Hoyt Koch MD  New Orders: Est. Patient Level IV [04540] Pulse Oximetry [94760] Nebulizer Tx [94640] Albuterol Sulfate Sol 1mg  unit dose [J8119] Planning Comments:   Patient does not want any more steroid (especially a shot) and would prefer no more antibiotics.  Since this is likely a viral URI with asthmatic bronchospasm, this is ok.  However, she should finish up the Augmentin that she started to prevent resistance.  A breathing treatment was given in clinic and she feels better.  I would like her to take Sudafed for the next few days to control her post nasal drip and phlegm.  Also gave her Rx for Tessalon to help with cough.  Although the cough is likely from the bronchospasm and she just needs to take her asthma meds.  Encouraged lots of hydration, nasal saline, and rest.  I would like  her to follow up with Dr. Linford Arnold next week to discuss her asthma meds to see if they can be maximized, consider possible CXR to look for airspace disease, or to see if she needs to be referred to pulmonology.  If becoming worse in the meantime, she should seek medical care.  She asked for a Rx for Duonebs for her to use this next week until she sees Dr. Linford Arnold because it made her feel much better than plain Albuterol   The patient and/or caregiver has been counseled thoroughly with regard to medications prescribed including dosage, schedule, interactions, rationale for use, and possible side effects and they verbalize understanding.  Diagnoses and expected course of recovery discussed and will return if not improved as expected or if the condition worsens. Patient and/or caregiver verbalized understanding.  Prescriptions: DUONEB 0.5-2.5 (3) MG/3ML SOLN  (IPRATROPIUM-ALBUTEROL) use in nebulizer machine twice a day as needed  #QS x2 wks x 0   Entered and Authorized by:   Hoyt Koch MD   Signed by:   Hoyt Koch MD on 08/29/2010   Method used:   Print then Give to Patient   RxID:   [id number] TESSALON 200 MG CAPS (BENZONATATE) 1 by mouth three times a day as needed cough  #21 x 0   Entered and Authorized by:   Hoyt Koch MD   Signed by:   Hoyt Koch MD on 08/29/2010   Method used:   Print then Give to Patient   RxID:   [id number]   Medication Administration  Medication # 1:    Medication: Albuterol Sulfate Sol 1mg  unit dose    Diagnosis: BRONCHITIS, ACUTE WITH BRONCHOSPASM (ICD-466.0)    Route: inhaled    Exp Date: 12/01/2010    Lot #: [id number]    Mfr: mylan    Comments: 3mg  duo neb    Patient tolerated medication without complications    Given by: Lajean Saver RN (August 29, 2010 7:38 PM)  Orders Added: 1)  Est. Patient Level IV [13244] 2)  Pulse Oximetry [94760] 3)  Nebulizer Tx [01027] 4)  Albuterol Sulfate Sol 1mg  unit dose [O5366]

## 2010-10-05 NOTE — Consult Note (Signed)
Summary: [facility]   Imported By: Lanelle Bal 07/11/2010 10:48:12  _____________________________________________________________________  External Attachment:    Type:   Image     Comment:   External Document

## 2010-11-05 ENCOUNTER — Telehealth: Payer: Self-pay | Admitting: Family Medicine

## 2010-11-13 NOTE — Progress Notes (Signed)
Summary: Neb supplies  Phone Note Call from Patient   Caller: Patient Summary of Call: Please call Pt to discuss order for neb and neb supplies.  Initial call taken by: Payton Spark CMA,  November 05, 2010 2:20 PM  Follow-up for Phone Call        LM to [facility] at home number Francee Piccolo CMA Duncan Dull)  November 05, 2010 5:44 PM   2nd call to patient.  Patient states she will be running out of Douneb today.  Pt would like rx to be for three times a day instead of two times a day.  Pt also needs refill on Combivent.  Please advise.  Follow-up by: Francee Piccolo CMA Duncan Dull),  November 08, 2010 8:47 AM  Additional Follow-up for Phone Call Additional follow up Details #1::        Needs appt. If using duoneb that much as she need to be on a controller med.  Can go ahed and send refill for duoneb for now. I don't have combivent on her med list.  Additional Follow-up by: [name] MD,  November 08, 2010 9:43 AM    Additional Follow-up for Phone Call Additional follow up Details #2::    sent rx for neb supplies to [facility] Follow-up by: Avon Gully CMA, Duncan Dull),  November 08, 2010 3:39 PM  New/Updated Medications: * NEB AND NEB SUPPLIES Dx. COPD, Asthma Prescriptions: NEB AND NEB SUPPLIES Dx. COPD, Asthma  #99 x 1   Entered and Authorized by:   [name] MD   Signed by:   [name] MD on 11/08/2010   Method used:   Printed then faxed to ...       [facility]  [place] [phone]* (retail)       [address] [place], [place]  [address]       Ph: [phone] or [phone]       Fax: [phone]   RxID:   [id number]   Appended Document: Neb supplies    Clinical Lists Changes  Medications: Rx of DUONEB 0.5-2.5 (3) MG/3ML SOLN (IPRATROPIUM-ALBUTEROL) use in nebulizer machine twice a day as needed;  #90 x 0;  Signed;  Entered by: Avon Gully CMA, (AAMA);  Authorized by: [name] MD;  Method used: Electronically to [facility] [phone]*,  [address] [place], [place]  [address], Ph: [phone] or [phone], Fax: [phone]    Prescriptions: DUONEB 0.5-2.5 (3) MG/3ML SOLN (IPRATROPIUM-ALBUTEROL) use in nebulizer machine twice a day as needed  #90 x 0   Entered by:   Avon Gully CMA, (AAMA)   Authorized by:   [name] MD   Signed by:   Avon Gully CMA, (AAMA) on 11/08/2010   Method used:   Electronically to        [facility] [phone]* (retail)       [address] [place], [place]  [address]       Ph: [phone] or [phone]       Fax: [phone]   RxID:   [id number]

## 2010-11-20 ENCOUNTER — Telehealth: Payer: Self-pay | Admitting: Family Medicine

## 2010-11-20 NOTE — Telephone Encounter (Signed)
REceived results about overnight pulse ox and it was normal.  Please let her know.

## 2010-11-21 NOTE — Telephone Encounter (Signed)
Called and left message on vm with results

## 2010-11-27 ENCOUNTER — Encounter: Payer: Self-pay | Admitting: Family Medicine

## 2010-11-30 ENCOUNTER — Ambulatory Visit: Payer: Self-pay | Admitting: Family Medicine

## 2010-12-01 ENCOUNTER — Other Ambulatory Visit: Payer: Self-pay | Admitting: Family Medicine

## 2010-12-02 ENCOUNTER — Inpatient Hospital Stay
Admission: RE | Admit: 2010-12-02 | Discharge: 2010-12-02 | Disposition: A | Discharge status: Home / Self Care | Payer: Self-pay | Source: Ambulatory Visit | Attending: Emergency Medicine | Admitting: Emergency Medicine

## 2010-12-02 ENCOUNTER — Encounter: Payer: Self-pay | Admitting: Emergency Medicine

## 2010-12-16 ENCOUNTER — Other Ambulatory Visit: Payer: Self-pay | Admitting: Family Medicine

## 2011-01-03 ENCOUNTER — Other Ambulatory Visit: Payer: Self-pay | Admitting: Family Medicine

## 2011-01-10 ENCOUNTER — Other Ambulatory Visit: Payer: Self-pay | Admitting: Family Medicine

## 2011-01-10 ENCOUNTER — Encounter: Payer: Self-pay | Admitting: Family Medicine

## 2011-01-10 NOTE — Telephone Encounter (Signed)
Pt wants to switch all her scripts over to [facility] from local pharmacy. All the following: combivent MDI, losartan, potassium 25 meq, metoprolol 100mg , nasolide soln 0.025 mg spray,  Ipratropium albuterol duoneb 0.5-2.5(3)mg/3 ml soln.   Plan:  Routed to Sue Lush, CMA  To look at and see if she can do. Jarvis Newcomer, LPN Domingo Dimes

## 2011-01-14 ENCOUNTER — Inpatient Hospital Stay (INDEPENDENT_AMBULATORY_CARE_PROVIDER_SITE_OTHER)
Admission: RE | Admit: 2011-01-14 | Discharge: 2011-01-14 | Disposition: A | Discharge status: Home / Self Care | Payer: Medicare Other | Source: Ambulatory Visit | Attending: Family Medicine | Admitting: Family Medicine

## 2011-01-14 ENCOUNTER — Other Ambulatory Visit: Payer: Self-pay | Admitting: *Deleted

## 2011-01-14 ENCOUNTER — Ambulatory Visit (INDEPENDENT_AMBULATORY_CARE_PROVIDER_SITE_OTHER): Payer: Medicare Other

## 2011-01-14 DIAGNOSIS — J45909 Unspecified asthma, uncomplicated: Secondary | ICD-10-CM

## 2011-01-14 NOTE — Telephone Encounter (Signed)
Received request and will send rx

## 2011-01-15 ENCOUNTER — Other Ambulatory Visit: Payer: Self-pay | Admitting: *Deleted

## 2011-01-15 MED ORDER — IPRATROPIUM-ALBUTEROL 0.5-2.5 (3) MG/3ML IN SOLN: 3.0000 mL | RESPIRATORY_TRACT | Status: DC | PRN

## 2011-01-15 MED ORDER — FLUNISOLIDE 25 MCG/ACT (0.025%) NA SOLN: 2.0000 | Freq: Every day | NASAL | Status: DC

## 2011-01-15 NOTE — Assessment & Plan Note (Signed)
[facility]                         GASTROENTEROLOGY OFFICE NOTE   NAME:[name], Deborah NUSSBAUMER                     MRN:          [id number]  DATE:02/03/2007                            DOB:          [birth year]    I want to make a correction on my note from February 03, 2007 when I saw Ms.  [name].  Her previous endoscopic and colonoscopic exams were in 2006  and not 2008.     Vania Rea. Jarold Motto, MD, Caleen Essex, FAGA  Electronically Signed    DRP/MedQ  DD: 04/09/2007  DT: 04/09/2007  Job #: [id number]   cc:   Alfredia Client, MD

## 2011-01-15 NOTE — Assessment & Plan Note (Signed)
OFFICE VISIT   Deborah [name], Deborah [name]  DOB:  Feb 22, 1946                                        December 22, 2007  CHART #:  [id number]   SUBJECTIVE:  [name] is a 65 year old lady who had a bronchoscopy  for possible mediastinal adenopathy back in 2001.  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  She as last seen  in the office in June 2008 and had multiple appointments this spring,  but had been unable to keep them due to family situation and financial  issues.  She now returns for followup, and has had a CT to followup  these issues.   [name] states that she has been feeling poorly.  She is depressed  frequently and has very low energy.  She occasionally has some chest  pains.  Again her husband died about a year ago and she has been dealing  with that, as well as other family issues and financial issues.   Patient's medical history form was reviewed and is on the chart.   PHYSICAL EXAMINATION:  GENERAL:  [name] is a 65 year old white  female in no acute distress.  Her blood pressure is 141/83, pulse 78,  respirations 18, her oxygen saturation is 93% on room air.  LYMPH NODES:  There is no cervical or supraclavicular adenopathy, and  previous mediastinoscopy incision is well healed.   DIAGNOSTIC DATA:  Chest CT is reviewed, it shows no significant change  and possibly decrease in the size of the paratracheal cyst versus node.  No change in the right lung nodule.  This has been stable for 18 months.   IMPRESSION:  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  I would be  happy to see her back in the future at any time if I can be of any  further assistance with her care.   Salvatore Decent Dorris Fetch, M.D.  Electronically Signed   SCH/MEDQ  D:  12/22/2007  T:  12/22/2007  Job:  [id number]   cc:   Samara Snide, MD

## 2011-01-15 NOTE — Assessment & Plan Note (Signed)
[facility]                         GASTROENTEROLOGY OFFICE NOTE   NAME:Deborah [name], Deborah [name]                     MRN:          [id number]  DATE:06/30/2007                            DOB:          06-18-1946    Deborah [name] continues to complain of abdominal gas, cramping diffusely,  and watery diarrhea.  Trials of Colestid and Bentyl have not helped her  problems.  As per my previous note, she has had previous extensive GI  workups, all of which have been unremarkable.   Despite the above complaints, she has had no anorexia, weight loss, or  systemic complaints.  She weighs 222 pounds, which is up some 4 pounds  from June 2008.  Blood pressure 116/84, and pulse 72 and regular.  Abdominal exam was unremarkable, without organomegaly, masses,  tenderness, or distention.   ASSESSMENT:  Deborah [name] appears to have diarrhea-predominant irritable  bowel syndrome, and I wonder if she has underlying psychiatric problems.  She does have a history of bipolar disorder and has been almost, as per  my previous notes, Manical about her health.   RECOMMENDATIONS:  I think this patient would benefit from a trial of  Lotronex 0.5 mg twice a day.  I have explained to her the risk of  ischemic colitis and will have her review the literature and sign an  informed patient consent form.  She has really tried all other types of  therapy which have not been useful to her, and I doubt repeat GI  evaluation at this time would seem in order.  I did send her by the lab  today to check standard laboratory parameters which have not been done  in our lab at least for several years.     Vania Rea. Jarold Motto, MD, Caleen Essex, FAGA  Electronically Signed    DRP/MedQ  DD: 06/30/2007  DT: 07/01/2007  Job #: [id number]

## 2011-01-15 NOTE — Assessment & Plan Note (Signed)
[facility]                         GASTROENTEROLOGY OFFICE NOTE   NAME:[name], Deborah CHIEM                     MRN:          [id number]  DATE:02/03/2007                            DOB:          06-Feb-1946    Deborah [name], as per my previously detailed note of September 26, 2006, is  a 65 year old, white female, status post cholecystectomy with chronic  bipolar disorder which has been difficult to manage.  She is under  psychiatric care at this time.  She is on a variety of different  medications listed and reviewed in her chart.  I saw her initially on  September 26, 2006, with acid reflux symptoms and she was placed on PPI  therapy after she had an endoscopic exam which was essentially  unremarkable except for some distal esophagitis and 3 cm hiatal hernia.  Colonoscopy at that time was unremarkable including colon biopsies.  Small bowel biopsy also showed no evidence of celiac disease.  She had  been previously evaluated in our office and has known fatty infiltration  of her liver with mildly abnormal liver function tests.   The patient recently has continued epigastric abdominal pain with  alternating diarrhea and constipation with gas and bloating.  She saw  Dr. Morrie Sheldon and apparently had a positive Helicobacter pylori test and is  now doing a Prevpac and has said that her symptoms have pretty much  resolved.  She denies nausea, vomiting, diarrhea, anorexia, weight loss,  melena or hematochezia.  As mentioned previously, she is status post  cholecystectomy.   PHYSICAL EXAMINATION:  GENERAL:  A healthy-appearing, white female in no  distress, appearing her stated age.  VITAL SIGNS:  Weight 218 pounds, blood pressure 118/64, pulse 80 and  regular.  ABDOMEN:  I could not appreciated hepatosplenomegaly, abdominal masses  or significant tenderness.  Bowel sounds were normal.  RECTAL:  Deferred.   ASSESSMENT:  I think Deborah [name] most likely has irritable  bowel  syndrome, but also has chronic acid reflux problems.  Her  gastroenterology workup otherwise has been unremarkable.  She seems to  be having an excellent response at this time to Helicobacter pylori  therapy.  She did have a CLO-biopsy done at the time of her endoscopy,  but I cannot find this result.  CT scan of the abdomen was last done by  Dr. Rudi Heap in March 2006.  It was essentially unremarkable except  for an area of focal fatty infiltration in her liver.  Lab data in the  past otherwise has been noncontributory as have stool exams.   RECOMMENDATIONS:  1. Finish Prevpac and we will see how she does symptomatically.  2. Trial of Align probiotic therapy daily.  3. Consider trial of Xifaxan therapy depending on her clinical course.  4. Other medications as per Dr. Morrie Sheldon and her psychiatrist.   ADDENDUM  The patient also has previously been evaluated by Dr. Anselmo Rod  and Dr. Carman Ching, both gastroenterologists, over the last several years without  any concrete findings being determined.     Vania Rea. Jarold Motto, MD, Caleen Essex, FAGA  Electronically Signed    DRP/MedQ  DD: 02/03/2007  DT: 02/03/2007  Job #: [id number]   cc:   Alfredia Client, M.D.

## 2011-01-15 NOTE — Assessment & Plan Note (Signed)
[facility]                         GASTROENTEROLOGY OFFICE NOTE   NAME:Deborah [name]                     MRN:          [id number]  DATE:08/21/2007                            DOB:          [birth year]    Deborah [name] is about the same as she has always been.  She refuses to take  any medications and said she had severe cramping with one dose of  Lotronex.  She refuses to take Bentyl or any antispasmodics, and really,  I am not sure why she seeks gastrointestinal evaluation except that she  has chronic bipolar disorder and is very maniacal about her health.   Because of abnormal liver function tests, I checked some screening  parameters that showed a normal prothrombin time, weakly positive ANA,  negative antismooth muscle antibody, negative antimitochondrial  antibody, and normal alpha-1 antitrypsin level.  Ammonia level was  slightly increased to 90, normal being 19-87.  Ceruloplasmin level was  also normal.  They did not obtain a hepatitis C antibody and hepatitis B  surface antigen, as requested, at [facility] lab.   Ultrasound showed post cholecystectomy changes with diffuse fatty  infiltration of the liver.  The transaminases were mildly elevated with  an SGOT of 44 and SGPT of 75.  Otherwise, normal liver tests, including  an albumin of 4.3 gm% and normal iron levels, B12, folate, and ferritin  levels.   PHYSICAL EXAMINATION:  She weighs 226 pounds.  Blood pressure 114/82,  pulse 70 and regular.  I could not appreciate hepatosplenomegaly,  abdominal masses, tenderness, or any stigmata of chronic liver disease.   ASSESSMENT:  1. 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  2. Chronic irritable bowel syndrome, diarrhea predominant.  3. Abdominal gas and bloating with good response to an over-the-      counter probiotic, which she has ordered from the Internet.  4. Chronic bipolar  disorder.  5. Acid reflux disease without therapy, since she refuses to take most      medications.   RECOMMENDATIONS:  1. Check hepatitis B surface antigen and hepatitis C antibody.  2. I have advised that weight reduction will be the best treatment for      a lot of her problems, include her reflux and fatty liver.  3. Consider a liver biopsy, although I doubt the patient will be able      to agree to this procedure and its risks or handle this from a      psychological standpoint.  4. Continue primary care followup with Dr. Molly Maduro [name].    Deborah Rea. Jarold Motto, MD, Deborah [name], FAGA  Electronically Signed   DRP/MedQ  DD: 08/21/2007  DT: 08/22/2007  Job #: [id number]

## 2011-01-15 NOTE — Assessment & Plan Note (Signed)
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  She had a mediastinoscopy and drainage of  her right paratracheal cyst back in 2001.  She then came back in October  2007, after a CT scan showed a small right nodule as well as what was  called a right paratracheal node which was about 2 x 1.8-cm.  She has  been followed since that time.  She was last seen in the office in  December and there was no change in those findings.  She now returns  with a repeat CT scan.  She states that her breathing actually has been  doing very well this year, despite allergies.  She has not had any  asthma flares or any problems with significant wheezing.  Unfortunately,  her husband passed away from metastatic melanoma about three weeks ago  and she is very emotional obviously upset about that.   The patient's medical history form is reviewed and is on the chart.  There have been no interval changes.   PHYSICAL EXAMINATION:  GENERAL:  Deborah [name] is a 65 year old white  female in no acute distress.  VITAL SIGNS:  Her blood pressure is 107/80, pulse 73, respirations are  18, her oxygen saturation is 94% on room air.  LUNGS:  Clear with equal breath sounds bilaterally.  LYMPHATICS:  There is no cervical, supraclavicular, axillary, or  epitrochlear adenopathy.   LABORATORY DATA:  CT scan shows a slight decrease in size in the right  paratracheal lesion which is fluid density, stable to slight decrease in  size of her right upper lobe nodule.   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  This is stable  to decreased in  size.  She also has a small right upper lobe nodule which is stable in  size over 6 months.  We will plan to re-scan her in 6 months.  We will  follow this out for about two years just to make sure that there is no  change, but I suspect this is all benign.   Salvatore Decent Dorris Fetch, M.D.  Electronically Signed   SCH/MEDQ  D:  02/02/2007  T:  02/02/2007  Job:  [id number]   cc:   Dietrich Pates, Dr.

## 2011-01-15 NOTE — Assessment & Plan Note (Signed)
[facility]                         GASTROENTEROLOGY OFFICE NOTE   NAME:Deborah [name], Deborah [name]                     MRN:          [id number]  DATE:04/09/2007                            DOB:          April 23, 1946    Deborah [name] has been treated twice by Dr. Morrie Sheldon for Helicobacter pylori.  Deborah [name]  continues to complain of right lower quadrant pain with alternating  diarrhea and constipation, gas and bloating.  Deborah [name] has had no anorexia,  weight loss, in fact is gaining weight.  Deborah [name] is status post  cholecystectomy.  Deborah [name] has had multiple surgical procedures including  appendectomy and pelvic surgery.  As per my previous notes Deborah [name] had  previously evaluated by Dr.'s Randa Evens and Loreta Ave.  Deborah [name] has had previous  colonoscopy with biopsies for microscopic colitis and these were  negative.  Gallbladder was removed some 5 years ago.  Small bowel biopsy  was also normal as were all chemistries.   Patient has bipolar disorder and obviously is manical about her health.  One her close friends told her that Deborah [name] has diabetes and Deborah [name] has now  scheduled an appointment to see a doctor for diabetic management.  Deborah [name]  has no nausea or vomiting, melena or hematochezia.   MEDICATIONS:  1. Toprol XL 100 mg a day.  2. Inhalers daily.  3. Lunesta 3 mg at bedtime.  4. Triamterine/HCTZ 37.5/25 mg a day.  5. Lithium 300 mg a day.  6. A variety of p.r.n. medications.   Deborah [name] is healthy-appearing but somewhat confused.  Deborah [name] weighs 222 pounds  which is up 5 pounds from June.  Blood pressure 110/86 and pulse was 64  and regular.  ABDOMINAL:  Entirely benign.   ASSESSMENT:  Deborah [name] has irritable bowel syndrome and I do not think  further gastrointestinal evaluation is indicated at this time.  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   RECOMMENDATIONS:  1. Trial of Colestid 1 gram twice a day.  2. Trial of Bentyl 10 mg 3 times a  day before meals.The patient      refused Rx.  3. Low-fat and low-fiber diet as tolerated.  4. Continue other medications per Dr. Morrie Sheldon.  5. Psychiatric re-evaluation probably needed.    Vania Rea. Jarold Motto, MD, Caleen Essex, FAGA  Electronically Signed   DRP/MedQ  DD: 04/09/2007  DT: 04/09/2007  Job #: [id number]   cc:   Alfredia Client, MD

## 2011-01-18 NOTE — Assessment & Plan Note (Signed)
[facility]                         GASTROENTEROLOGY OFFICE NOTE   NAME:Deborah [name], Deborah [name]                     MRN:          [id number]  DATE:09/26/2006                            DOB:          1946-01-03    Deborah [name] is a middle-aged white female who apparently has what looks  like a possible bipolar disorder, recently started Lamictal 25 mg at  bedtime and Lunesta 3 mg at bedtime.  She has been on Abilify 5 mg for  some time.  She comes to my office today complaining of abdominal gas,  bloating, and vague abdominal discomfort.  She really has fairly regular  bowel habits, and denies melena or hematochezia.   Her main complaint is acid reflux with recurrent chest pain, and she  apparently was in the emergency room recently with chest pain.  It was  advised that she start on PPI.  She is currently taking over-the-counter  Prilosec.  She denies associated dysphagia.  She denies any  hepatobiliary complaints.  She does have an abnormal CT scan, which is  being followed apparently by her primary care physicians.  She has what  appears to be fatty infiltration of her liver and a small splenic cyst.  I do not have recent records for review.  Her last endoscopy in our  office was in June 2006, at which time she had a normal duodenal biopsy  without evidence of celiac disease, normal colon biopsies on  colonoscopy.  She has had a diarrhea/malabsorption workup which has  otherwise been unremarkable.   She is on a variety of medications today including the above mentioned  medicines.  She also takes:  1. Klonopin 1 mg three times a day.  2. Toprol XL 100 mg a day.  3. Triamterene/hydrochlorothiazide daily.  4. A variety of p.r.n. inhalers for what sounds like chronic      obstructive lung disease.   She does have a history of hypertension and cardiomegaly.  Previous  colonoscopy did show diverticulosis.   She is awake and alert, but somewhat difficult  to talk to and has a very  poor memory.  She appears her stated age.  She weighs 216 pounds.  Blood pressure is 122/84.  Pulse was 80 and  regular.  CHEST:  Entirely clear.  I could not appreciate murmurs, gallops or rubs.  She was in a regular  rhythm.  There was an obese abdomen with no definite organomegaly, masses or  tenderness.  Bowel sounds were not obstructed.  Peripheral extremities were unremarkable.  RECTAL EXAM:  Deferred.   ASSESSMENT:  1. Acid reflux with need for prescription strength proton pump      inhibitor therapy.  2. Diverticulosis and irritable bowel syndrome.  3. Probable bipolar disorder.  4. History of chronic obstructive lung disease.  5. Abnormal CT scan, being followed up by her primary care physicians.      I am pretty sure she goes to [facility] for her      medical care.  6. History of hypertensive cardiovascular disease.   RECOMMENDATIONS:  1. Change to Nexium 40  mg thirty minutes before breakfast in the      morning and Prilosec at bedtime if needed.  2. High-fiber diet with bulking agents.  3. Continue other medications per her primary care physicians listed      above with p.r.n. GI followup.     Vania Rea. Jarold Motto, MD, Caleen Essex, FAGA  Electronically Signed    DRP/MedQ  DD: 09/26/2006  DT: 09/26/2006  Job #: [id number]   cc:   Jari Pigg, MD

## 2011-01-18 NOTE — Discharge Summary (Signed)
[facility]  Patient:    Deborah [name], Deborah [name]                MRN: [id number] Adm. Date:  [id number] Disc. Date: [id number] Attending:  Milagros Evener Dhami                           Discharge Summary  HISTORY OF PRESENT ILLNESS:  This is the first admission to [facility] for this 65 year old female who was admitted after she expressed that she could not go on like this, and she had been feeling very depressed in the past few weeks.  She had been feeling quite hopeless, helpless, and worthless.  She had been quite anxious and nervous.  She made the statement that she would rather die as opposed to feeling like this.  She could not stop crying.  She was getting to a point of being agitated.  She tried to keep herself busy but was not successful in doing so and was having a very difficult time with confusion.  She could not sleep at night.  She felt quite panicky.  She had been through quite a number of medication trials, and none of them had been successful.  Since the patient was not able to contract for safety, was a danger to herself, hence, this admission.  ADMISSION DIAGNOSES:   AXIS I.  1. Major depressive disorder, recurrent, severe, without psychotic               features.            2. Generalized anxiety disorder.  AXIS II.  Deferred. AXIS III.  Irritable bowel syndrome.  AXIS IV.  1. Treatment-resistant.            2. Depression.            3. Anxiety.   AXIS V.  30 at the time of admission.  HOSPITAL COURSE:  The patient was admitted to my services.  Was seen by Dr. Geoffery Lyons, my colleague, who was on call that weekend.  Physical exam was done by [facility] D. Adams, which was within normal range.  The patient was resumed on her medications.  Dr. Dub Mikes discontinued Neurontin and prescribed Seroquel 25 mg 1 p.o. b.i.d. and 2 at bedtime, Ambien 10 mg p.o. q.h.s. p.r.n. Imodium was prescribed for loose stools.  He increased  the dose of Serzone to 250 mg p.o. daily.  Her medical medications, which included Pulmicort 200 mcg 2 puffs b.i.d., Prevacid 30 mg p.o. q.d., Estraderm 0.05 mg per day patch applied on Fridays and Mondays and may use her own supply, were resumed. Ativan 1 mg b.i.d. p.o. was resumed.  The patient began to take part in individual and group psychotherapy.  I saw her on Jan 21, 2000, when she complained of feeling quite groggy.  I decreased the dose of Ativan to 0.5 mg p.o. q.a.m. and 2 q.p.m. and then q.h.s.  Since the patient exhibited signs and symptoms of ADHD and ADD, I tried her on Ritalin 5 mg three times a day, which was increased to 10 mg three times a day since the patient was able to tolerate that well on the first day, but she became very anxious and nervous and could not sleep and, hence, it was discontinued.  Serzone dose was decreased to 300 mg p.o. q.h.s.  Effexor XR 75 mg p.o. q.a.m. was added. Anxiety increased and, hence, Ativan  was increased to 0.5 b.i.d. and 1 at bedtime.  Ritalin was discontinued.  Seroquel 25 b.i.d. and 2 at bedtime. Serzone was further decreased to 200 mg p.o. q.h.s.  Effexor XR was increased to 112.5.  The patient took part in individual and group psychotherapy.  Her blood pressure increased and, hence, Effexor was held and then was discontinued.  Luvox 100 mg p.o. stat and then 50 b.i.d. was started.  The patient had taken Luvox before from Dr. Meredith Staggers when she was under his care, and she claims it had helped.  Ativan was increased to 1 mg p.o. q.i.d. since the patient was very anxious and nervous.  During her hospital stay, her blood work was done which revealed blood values as follows:  WBC, RBC, hemoglobin were essentially within normal limits.  So was the basic metabolic panel, as well as thyroid functions.  A UDS was negative, and UA was within normal limits.  A family meeting was conducted by Child psychotherapist, which the patient feels is not  going to help much since they have ongoing marital issues which have never been resolved.  They have received therapy before in the past which has not helped.  All restrictions were discontinued since the patient stated she was not suicidal and was discharged home on Jan 25, 2000, on the following medications.  DISCHARGE MEDICATIONS: 1. Luvox 100 mg 1/2 b.i.d. for five days, then 1 b.i.d. for five days, then    1-1/2 b.i.d. from there onward. 2. Ativan 1 mg q.i.d. 3. Seroquel 25 mg 1 b.i.d. and 2 at bedtime. 4. Pulmicort 200 mcg 2 puffs twice a day. 5. Prevacid 30 mg 1 twice a day.  FOLLOW-UP:  Appointments were given with Marliss Czar and myself.  DISCHARGE DIAGNOSES:   AXIS I.  1. Major depressive disorder, recurrent, moderately severe, without               psychotic features.            2. Generalized anxiety disorder, severe.  AXIS II.  Deferred. AXIS III.  Irritable bowel syndrome.  AXIS IV.  1. Ongoing chronic recurrent depression.            2. Marital problems.   AXIS V.  60 at the time of discharge.  PROGNOSIS:  Guarded. DD:  03/09/00 TD:  03/10/00 Job: [id number]

## 2011-01-18 NOTE — Discharge Summary (Signed)
NAMEMICHAIAH, MAIDEN NO.:  [id number]   MEDICAL RECORD NO.:  [id number]          PATIENT TYPE:  IPS   LOCATION:  0302                          FACILITY:  [facility]   PHYSICIAN:  Jasmine Pang, M.D. DATE OF BIRTH:  05-29-1946   DATE OF ADMISSION:  09/22/2006  DATE OF DISCHARGE:  09/25/2006                               DISCHARGE SUMMARY   IDENTIFYING INFORMATION:  A 65 year old married white female who was  admitted on a voluntary basis on September 22, 2006.   HISTORY OF PRESENT ILLNESS:  The patient was voluntarily admitted.  She  has recently been seeing a nurse practitioner who diagnosed her with  bipolar disorder and started new medications.  These included Abilify,  Lamictal, Lunesta and Klonopin.  The patient states she has a lot of  anxiety.  No suicidal ideation.  No substance abuse.  No homicidal  ideation.  No auditory hallucinations.  She has had insomnia with  difficulty falling asleep, loss of appetite and a small weight loss.  (Patient believes may be an ulcer).  No auditory hallucinations.  Positive mood swings.  Positive irritability.  On the day of admission,  she was stating she was feeling restless and tired and she believes this  was secondary to her Abilify.  The patient was admitted to our hospital  5 years ago 2 times to the inpatient unit in 2001 at [facility] with similar symptoms of depression and anxiety  and hopelessness.  The patient is the primary source of her history.  She sees [name], nurse practitioner, [facility].  Has a family history of depression in her son and bipolar  disorder in father; brothers have bipolar disorder; older brother has  Alzheimer's.  She denies any drug or alcohol abuse.  Medical problems  include hypertension, hyperlipidemia, asthma, irritable bowel syndrome  and headache.   The patient is on Abilify 10 mg one tablet daily, Klonopin 1 mg one half  tablet  up to t.i.d., Lamictal 25 mg one tablet p.o. q.h.s., Lunesta 3 mg  p.o. q.h.s., Toprol 100 mg one tablet q.a.m., Maxzide/Dyazide 37.5/25  daily, Prilosec 20 mg daily, Proventil inhaler p.r.n. shortness of  breath.   PHYSICAL FINDINGS:  The patient's physical exam revealed no acute  distress.  She was alert and cooperative.  She was noted to be  overweight.  She was complaining of tenderness in the right quadrant  area (upper and lower).  Otherwise she had a normal exam.  The abdominal  pain   Dictation ended at this point.      Jasmine Pang, M.D.  Electronically Signed     BHS/MEDQ  D:  09/25/2006  T:  09/25/2006  Job:  [id number]

## 2011-01-18 NOTE — Procedures (Signed)
[place]  Patient:    Deborah [name], Deborah [name] Visit Number: [id number] MRN: [id number]          Service Type: END Location: ENDO Attending Physician:  Orland Mustard Dictated by:   Llana Aliment. Randa Evens, M.D. Proc. Date: 07/16/01 Admit Date:  07/16/2001   CC:         Viviann Spare A. Cleta Alberts, M.D., [facility]   Procedure Report  PROCEDURE:  Colonoscopy and biopsy.  MEDICATIONS:  Fentanyl 100 mcg, Versed 10 mg IV for both procedures.  SCOPE:  Olympus pediatric video colonoscope.  INDICATION:  Persistent diarrhea, abdominal cramping, family history of colon cancer.  This is in a distant family relative.  Due to a change in bowel habits and this family history, I felt that we ought to image her colon.  DESCRIPTION OF PROCEDURE:  The procedure had been explained to the patient and consent obtained.  With the patient in the left lateral decubitus position, the Olympus pediatric video colonoscope inserted, advanced under direct visualization.  Prep excellent, and we were able to reach the cecum without difficulty.  The ileocecal valve and appendiceal orifice seen.  Scope withdrawn.  Cecum, ascending colon, hepatic flexure, transverse colon, splenic flexure, descending, and sigmoid colon were seen well upon withdrawal.  No polyps were seen, no tumors, no significant diverticular disease.  The mucosa appeared completely normal throughout.  Multiple random biopsies were taken throughout the colon and placed in a single jar to look for evidence of microscopic colitis.  The scope was withdrawn.  The patient tolerated the procedure well, was maintained on low-flow oxygen and pulse oximeter throughout the procedure.  ASSESSMENT:  Essentially normal endoscopic colonoscopy.  PLAN:  Will check pathology and make further recommendations depending on the results. Dictated by:   Llana Aliment. Randa Evens, M.D. Attending Physician:  Orland Mustard DD:   07/16/01 TD:  07/16/01 Job: [id number]

## 2011-01-18 NOTE — Procedures (Signed)
[place]  Patient:    Deborah [name], DOBBIN Visit Number: [id number] MRN: [id number]          Service Type: END Location: ENDO Attending Physician:  Orland Mustard Dictated by:   Llana Aliment. Randa Evens, M.D. Proc. Date: 07/16/01 Admit Date:  07/16/2001   CC:         Viviann Spare A. Cleta Alberts, M.D.   Procedure Report  PROCEDURE:  Esophagogastroduodenoscopy.  MEDICATIONS:  Cetacaine spray, fentanyl 75 mcg, Versed 7.5 mg IV.  INDICATION:  Persistent dyspepsia.  DESCRIPTION OF PROCEDURE:  The procedure had been explained to the patient and consent obtained.  With the patient in the left lateral decubitus position, the Olympus video endoscope was inserted blindly and advanced under direct visualization.  The stomach was entered, pylorus identified and passed.  The duodenum, including the bulb and second portion, was seen well and was unremarkable.  The scope was withdrawn back into the stomach.  The pyloric channel, antrum, and body were seen well and were normal.  No ulceration or inflammation was seen.  The fundus and cardia were seen in the retroflex view and appeared normal.  There was mild inflammation, a widely patent GE junction and the distal esophagus was red.  The scope was withdrawn, and the proximal esophagus was normal.  The scope was withdrawn.  The patient tolerated the procedure well.  ASSESSMENT:  Hiatal hernia with probable mild esophagitis secondary to reflux.  PLAN:  Will treat with a proton pump inhibitor, proceed with colonoscopy at this time. Dictated by:   Llana Aliment. Randa Evens, M.D. Attending Physician:  Orland Mustard DD:  07/16/01 TD:  07/16/01 Job: [id number]

## 2011-01-18 NOTE — Op Note (Signed)
[facility]  Patient:    Deborah [name], Deborah [name] Visit Number: [id number] MRN: [id number]          Service Type: SUR Location: 1E 0106 01 Attending Physician:  Katha Cabal Dictated by:   Thornton Park Daphine Deutscher, M.D. Proc. Date: 09/15/01 Admit Date:  09/15/2001   CC:         Fayrene Fearing L. Randa Evens, M.D.  Dr. Dorothey Baseman   Operative Report  PREOPERATIVE DIAGNOSIS:  Chronic cholecystitis.  POSTOPERATIVE DIAGNOSIS:  Chronic cholecystitis.  PROCEDURE:  Laparoscopic cholecystectomy and intraoperative cholangiogram.  SURGEON:  Thornton Park. Daphine Deutscher, M.D.  ASSISTANT:  Gita Kudo, M.D.  ANESTHESIA:  General endotracheal.  DESCRIPTION OF PROCEDURE:  The patient is a 65 year old lady taken to room #11 on the afternoon of September 15, 2001, and after general anesthesia was administered, the abdomen was prepped with Betadine, and draped sterilely.  I made a longitudinal incision down to the umbilicus where I found a small umbilical hernia, and I went through that, and placed a Hasson cannula, and insufflated.  Three upper abdominal trocars were placed.  The gallbladder had sort of what looked like an incarcerated stone within the wall of the fundus. The gallbladder was grasped and elevated.  Calots triangle was dissected and a clip was placed up on the gallbladder and two on the artery.  An incision was made in the cystic duct and the Reddick catheter was inserted into the cystic duct, and a dynamic cholangiogram showed good filling of the small common duct, and free flow into the duodenum.  The cystic duct was triply clipped and divided, and then the gallbladder was removed from the gallbladder bed without entering it using the hook electrocautery.  No bleeding or bile leaks were noted.  Once detached, it was placed in a bag, and brought out through the umbilicus.  Went back and irrigated around the gallbladder. Again, no bleeding or bile leaks were  noted.  All ports were injected with 0.5% Marcaine.  The umbilical port was repaired with a figure-of-eight suture of 0 Vicryl.  This was inspected from within with the laparoscope.  The abdomen was then deflated.  The wounds were all closed with 4-0 Vicryl with Benzoin and Steri-Strips.  The patient tolerated the procedure well and was taken to the recovery room in satisfactory condition. Dictated by:   Thornton Park Daphine Deutscher, M.D. Attending Physician:  Katha Cabal DD:  09/15/01 TD:  09/16/01 Job: [id number]

## 2011-01-18 NOTE — H&P (Signed)
[facility]  Patient:    Deborah [name], Deborah [name]                MRN: [id number] Adm. Date:  [id number] Attending:  Milagros Evener Dhami                   Psychiatric Admission Assessment  CHIEF COMPLAINT:  "Im depressed."  HISTORY OF PRESENT ILLNESS:  This is the first admission to [facility] for this 65 year old female that was admitted after she expressed that she could not going on like she has been in the last few weeks. Expressed concern to her primary psychiatrist that she was not getting any better; that she was feeling like she would rather die although she did not admit to suicidal plan.  She feels that in the last two months she has been doing worse.  Has been changed on medications more recently.  She was using Neurontin 300 mg three times a day.  Then Neurontin was decreased to 100 mg three times a day.  She said she was crying again; could not be left by herself.  She was getting to a point of being agitated.  She tries to keep herself busy but she is not successful doing so.  Having a hard time with confusion.  Having a hard time with sleep.  She felt panicky.  She has been through quite a number of medication trials; none of them successful.  Has been anxious for a long, long time.  Admits to lots of anxiety; at the same time, no energy, no motivation, feeling very overwhelmed.  Stressors include conflict in the relationship with the husband.  They have been together for 36 years.  Her daughter is pregnant again.  They have a 42-1/2-year-old granddaughter that they are raising.  They are having financial difficulties. All of this has been building up to a point that she felt she could not handle it anymore and more needed more extensive help.  PAST PSYCHIATRIC HISTORY:  Was seen at [facility] when she was in the [employer]. There she was treated with some antidepressants as well as Tegretol. Has seen Dr. Jennelle Human, Dr. Katrinka Blazing  and, more recently, Dr. Lafayette Dragon.  She has been on Prozac that worsened her irritable bowel syndrome.  She was Celexa that maintained her irritable bowel syndrome.  She has also been on Paxil, Zoloft, Luvox, Depakote, Wellbutrin, Zyprexa, Risperdal, Tegretol and probably some other psychotropic medications.  Very concerned about medication.  Checks the PDR to see side effects.  Some of them she is almost anxious before taking them, anticipating what it can do to her.  More recently, she was on Serzone increased up to 150 mg twice a day.  Was also on Neurontin that was decrease to 100 mg three times a day.  SOCIAL HISTORY:  She has married for 36 years.  Actively unemployed.  She has had a couple of chances to go back to work but she has not been able to successful.  Has five children, 35, 31, 25; the 1 and 72 year old are with her as well as 91-1/2-year-old granddaughter that stays with her.  Last worked 1-1/2 years ago.  FAMILY HISTORY:  Father with bipolar disorder.  Brother probably affected by a mood disorder too.  Mother died.  She was probably depressed.  She died of emphysema.  Has a daughter that suffers from depression.  SUBSTANCE ABUSE HISTORY:  Denies the use/abuse of any substances.  MEDICAL PROBLEMS:  Irritable  bowel syndrome.  ADMISSION MEDICATIONS: 1. Pulmicort 200 mcg 2 puffs twice a day. 2. Prevacid 30 mg every day. 3. Estraderm patch. 4. Ativan 1 mg four times a day as needed. 5. Serzone 150 mg twice a day. 6. Neurontin 100 mg three times a day.  PHYSICAL EXAMINATION:  Will be performed by Mallie Darting, P.A.  MENTAL STATUS EXAMINATION:  Well-nourished, well-developed and cooperative female with some pressured speech and anxious.  Mood of depression and anxiety.  Affect of depression and anxiety.  Her thought is logical and relevant.  Very concerned about medications and of wanting to get better.  At the same time, preoccupied with getting to a point where she could  lose control and be just like her father.  Denies any suicidal ideation.  Does have a sense of hopelessness and helplessness because this has been going on too long and has been seen no response on medications.  Has been very sensitive to medications she has tried in the past.  Cognition was intact to person, place and time.  Recent and remote memory were intact.  DIAGNOSES: Axis I:    Major depression, recurrent. Axis II:   Deferred. Axis III:  Irritable bowel syndrome. Axis IV: Axis V:    On admission 30-35; highest in last year 60.  PLAN: 1. Go ahead and admit. 2. Evaluate further. 3. Work on Pharmacologist. 4. We are going to try and increase the Serzone and see if she can tolerate    an increased dose of Serzone due to the severe anxiety and the feeling    overwhelmed and being alone. 5. We are going to start Seroquel 25 mg b.i.d. and 100 mg q.h.s. 6. We are also going to see if we can discontinue the Neurontin as it does not    seem to be working for her. 7. If tomorrow she has worsening of her irritable bowel syndrome, then we can    see that maybe the Anne Ng is making it worse.  We will then try to wean    off Serzone and look into some other antidepressant although she has tried    basically all antidepressants that we would try. 8. Once more stable, we are going to refer back to outpatient treatment. DD:  01/19/00 TD:  01/20/00 Job: [id number]

## 2011-01-18 NOTE — Discharge Summary (Signed)
NAMESHAR, PAEZ NO.:  [id number]   MEDICAL RECORD NO.:  [id number]          PATIENT TYPE:  IPS   LOCATION:  0302                          FACILITY:  [facility]   PHYSICIAN:  Jasmine Pang, M.D. DATE OF BIRTH:  30-May-1946   DATE OF ADMISSION:  09/22/2006  DATE OF DISCHARGE:  09/25/2006                               DISCHARGE SUMMARY   IDENTIFYING INFORMATION:  A 65 year old married white female.   HISTORY OF PRESENT ILLNESS:  The patient was admitted on a voluntary  basis on September 22, 2006.  She had recently been diagnosed with bipolar  disorder and had started new medications.  She has had a lot of anxiety.  No suicidal ideation.  No substance abuse.  No homicidal ideation.  No  auditory or visual hallucinations.  She has been having significant  insomnia with interrupted sleep, a loss of appetite, and small weight  loss.  She has had positive mood swings and positive irritability today,  feeling restless and tired she believes secondary to the Abilify she is  on.  She was inpatient 5 years ago, 2 times in 2001 at [facility] with similar symptoms of depression.  She sees  [name], Publishing rights manager, at [facility].  She is currently on Abilify 10 mg daily (started 1 day ago), Klonopin  0.5 mg up to t.i.d. p.r.n. anxiety.  She is also on Toprol 100 mg p.o.  q.a.m., Maxzide/Dyazide 37.5/25 daily, Prilosec 20 mg daily, and  Proventil inhaler p.r.n.  She is also on Lamictal 25 mg 1 tab p.o.  q.h.s. and Lunesta 3 mg p.o. q.h.s.  She has hypertension, headaches,  hyperlipidemia, asthma and irritable bowel syndrome.   PHYSICAL FINDINGS:  The patient was alert and oriented x3.  She was  casually dressed with good eye contact.  A physical exam revealed no  acute medical problems.  Her admission laboratories revealed a normal  CBC except for elevated platelets of 407,000.  A CMET which was within  normal limits  except for a low chloride of 95 and a low CO2 of 33.  Cardiac markers were within normal limits.  TSH was normal at 4.288.  Lipase was within normal limits.  Urine drug screen was negative.  Alcohol screen was negative, urinalysis within normal limits.   HOSPITAL COURSE:  Upon admission, the patient was continued on Lamictal  25 mg p.o. q.h.s., Lunesta 3 mg p.o. q.h.s., Abilify 10 mg daily,  clonazepam 1 mg p.o. t.i.d. p.r.n. anxiety, Toprol 100 mg p.o. daily,  triamterene/HCTZ 37.5/25 daily.  The patient was also started on  Protonix 40 mg daily.  On September 23, 2006, Abilify was changed to 5 mg  p.o. q.h.s. x1 day.  On September 25, 2006, the patient was started on  DuoNeb treatments now and t.i.d. p.r.n. asthma.  The patient tolerated  the medications well with no significant side effects.   Upon first meeting the patient on September 23, 2006, she stated she had  been depressed for a number of years.  She was placed on Cymbalta this  summer.  She stated it helped but she stopped it.  She slowly got worse.  She lives with her husband, but he is a Orthoptist and is gone quite a  bit.  She was started on Abilify because there was some thought that she  may be bipolar.  She was already on Lamictal 25 mg daily.  She states  she was getting very anxious at home because she [place] alone [place] and on her new medication.  She got to the point of not  functioning and feeling she would be better off dead.  She is having a  lot of middle of the night awakening and decreased appetite.  She is  being seen at the [facility] for medication  management.  On September 24, 2006, the patient did not sleep well.  She  was worried about her medicines making her too sleepy during the day.  She had a lot of somatic complaints.  She states her husband came to  visit last night, and the visit with him went well.  She has asked her  husband to stay home longer, but he is limited by  his pastoral  counseling.  She also had to have a Fleets enema for constipation.   On September 25, 2006, the hospital counselor met with the patient and the  patient's husband.  She stated she was not suicidal or homicidal.  She  stated that she is feeling better and that her biggest concern is  dealing with her bipolar.  Her husband said that he did not have any  concerns about the patient and that he will continue to support her in  every way possible.  They discussed various ways for this to happen.  She agreed to follow up with her psychiatrist and wanted to return back  home with her husband.  The patient's mental status had improved  markedly.  She was friendly and cooperative with good eye contact.  Speech normal rate and flow.  Psychomotor activity within normal limits.  Mood was less depressed and anxious.  Affect able to reveal wider range.  There was no suicidal or homicidal ideation.  There were no thoughts of  self-injurious behavior, no auditory or visual hallucinations.  No  paranoia or delusions.  Thoughts were logical and goal-directed.  Thought content no predominant theme.  Cognitive exam was back to  baseline.  It was felt the patient was stable to be discharged today.   DISCHARGE DIAGNOSES:  AXIS I:  1. Mood disorder NOS, severe.  2. Anxiety disorder NOS.  AXIS II:  None.  AXIS III:  Hypertension, gastroesophageal reflux disease, asthma,  arthritis, IDS.  AXIS IV:  Moderate (problems with primary support group, economic  problem, medical problems.  Burden of psychiatric illness).  AXIS V:  GAF upon discharge was 50.  GAF upon admission was 40.  GAF  highest past year was 65.   DISCHARGE/PLANS:  There were no specific activity level or dietary  restrictions.   DISCHARGE MEDICATIONS:  1. Prilosec to be taken as directed at home.  2. Lamictal 25 mg p.o. q.h.s.  3. Lunesta 3 mg at bedtime.  4. Toprol XL 100 mg tab daily. 5. Triamterene/HCTZ 37.5/25 daily.  6.  Abilify 5 mg at bedtime.  7. Klonopin 1 mg t.i.d. p.r.n.  8. Proventil inhaler to be used as directed at home.   POSTHOSPITAL CARE PLANS:  The patient will return to the [facility] to  see [name] on Friday, January 25 at 10:55  a.m.      Jasmine Pang, M.D.  Electronically Signed     BHS/MEDQ  D:  10/29/2006  T:  10/29/2006  Job:  [id number]

## 2011-01-18 NOTE — Op Note (Signed)
[place]. [facility]  Patient:    Deborah [name], Deborah [name]                     MRN: [id number] Proc. Date: 03/25/00 Adm. Date:  [id number] Attending:  Charlett Lango CC:         Elvina Sidle, M.D.                           Operative Report  PREOPERATIVE DIAGNOSIS:  Mediastinal adenopathy.  POSTOPERATIVE DIAGNOSIS:  Right paratracheal bronchogenic cyst.  OPERATION PERFORMED:  Bronchoscopy, mediastinoscopy.  SURGEON:  Salvatore Decent. Dorris Fetch, M.D.  ASSISTANT:  ANESTHESIA:  General.  FINDINGS:  A 3 cm right paratracheal bronchogenic cyst, clear fluid, no signs of infection.  Cyst fluid evacuated, portion of cyst wall sent for pathology, slightly enlarged subcarinal node, biopsy sent for permanent sections.  INDICATIONS FOR PROCEDURE:  Ms. Ritthaler is a 65 year old female.  She had been treated for pneumonia approximately three months prior to this admission.  At that time chest x-ray had been obtained which showed widening in the right paratracheal region.  She underwent a chest CT which showed an enlarged lymph node in the right paratracheal area.  There were also some enlarged lymph nodes in the subcarinal area but not to the same degree.  The patient was referred for consideration biopsy at that time but as she is recovering from a lower respiratory tract infection, it was felt that these nodes may be reactive and no further work-up was necessary at that time.  She had a repeat CT scan performed recently which showed no resolution of the right paratracheal mass.  She was advised to undergo biopsy to rule out lymphoma or other malignancy.  The indications, risks, benefits and alternatives were discussed in detail with the patient and her husband.  They understood the risks and agreed to proceed.  DESCRIPTION OF PROCEDURE:  Ms. Nam was brought to the preop holding area on March 25, 2000.  Lines are placed for arterial blood pressure monitoring  and intravenous access.  The patient was taken to the operating room, anesthetized and intubated.  Intravenous antibiotics were administered.  Flexible fiberoptic bronchoscopy was performed.  The bronchoscopy revealed no endobronchial lesions and normal endobronchial anatomy.  The neck and chest then were prepped and draped in the usual fashion.  A transverse incision was made one fingerbreadth above the sternal notch.  It was carried through the skin and subcutaneous tissues.  Hemostasis was achieved with electrocautery.  There was a good deal of subcutaneous fat which was divided.  The strap muscles were identified and the fascial plane between them was divided vertically.  The pretracheal fascia was identified and incised.  Of note, the innominate artery could be seen above the sternal notch.  This was carefully avoided during the dissection and care was taken not to impinge on the innominate artery during the mediastinoscopy.  The mediastinoscope then was inserted and the anterior paratracheal plane was developed all the way down to the subcarinal lymph nodes.  There was a slightly enlarged lymph node in this area.  Biopsy was taken.  Hemostasis was achieved with cautery.  The right paratracheal region then was explored.  This was the area of primary concern.  A large thin-walled cystic structure could be seen in the right paratracheal region.  A fine needle was introduced into the cystic structure.  No blood was obtained.  The needle was withdrawn  and serous fluid could be seen draining from the area consistent with a bronchogenic cyst.  A small biopsy forcep then was used to take multiple pieces of the cyst wall for permanent section.  The cyst was completely drained.  The wound then was packed with 4 x 8 sponge.  After waiting five minutes, the packing was removed.  The mediastinoscope was reinserted. Hemostasis was excellent.  The scope was withdrawn.  The strap muscles  were loosely reapproximated in the midline using a single 3-0 Vicryl suture. The platysma was closed with a running 3-0 Vicryl suture and the skin was closed with a 4-0 Vicryl subcuticular suture.  The patient tolerated the procedure well without intraoperative complications.  Sponge, needle and instrument counts were correct at the end of the procedure.  The patient was extubated in the operating room and taken to the post anesthesia care unit extubated in stable condition.  DESCRIPTION OF PROCEDURE: DD:  03/25/00 TD:  03/27/00 Job: [id number]

## 2011-01-22 ENCOUNTER — Telehealth: Payer: Self-pay | Admitting: Family Medicine

## 2011-01-24 ENCOUNTER — Telehealth: Payer: Self-pay | Admitting: Family Medicine

## 2011-01-24 NOTE — Telephone Encounter (Signed)
Pt called and LM on triage voice line wants to start sending scripts [facility].  However, when meds list reviewed it is extensive.  Called pt but she was unavailable and spoke with her daughter and told her to give message that I need to know which scripts she would like called at this time.  Pending pt call back to give the meds needed for mail order. Routed to self. Jarvis Newcomer, LPN Domingo Dimes

## 2011-01-31 NOTE — Telephone Encounter (Signed)
Closed. Logan Baltimore, LPN /Triage  

## 2011-02-02 ENCOUNTER — Emergency Department (HOSPITAL_COMMUNITY)
Admission: EM | Admit: 2011-02-02 | Discharge: 2011-02-02 | Disposition: A | Discharge status: Home / Self Care | Payer: Medicare Other | Attending: Emergency Medicine | Admitting: Emergency Medicine

## 2011-02-02 ENCOUNTER — Emergency Department (HOSPITAL_COMMUNITY): Payer: Medicare Other

## 2011-02-02 DIAGNOSIS — K649 Unspecified hemorrhoids: Secondary | ICD-10-CM | POA: Insufficient documentation

## 2011-02-02 DIAGNOSIS — R109 Unspecified abdominal pain: Secondary | ICD-10-CM | POA: Insufficient documentation

## 2011-02-02 DIAGNOSIS — E7800 Pure hypercholesterolemia, unspecified: Secondary | ICD-10-CM | POA: Insufficient documentation

## 2011-02-02 DIAGNOSIS — K5900 Constipation, unspecified: Secondary | ICD-10-CM | POA: Insufficient documentation

## 2011-02-02 DIAGNOSIS — Z9089 Acquired absence of other organs: Secondary | ICD-10-CM | POA: Insufficient documentation

## 2011-02-02 DIAGNOSIS — I10 Essential (primary) hypertension: Secondary | ICD-10-CM | POA: Insufficient documentation

## 2011-02-02 LAB — COMPREHENSIVE METABOLIC PANEL
Alkaline Phosphatase: 57 U/L (ref 39–117)
BUN: 15 mg/dL (ref 6–23)
CO2: 31 mEq/L (ref 19–32)
Calcium: 9.4 mg/dL (ref 8.4–10.5)
GFR calc non Af Amer: >60 mL/min (ref >60)
Glucose, Bld: 95 mg/dL (ref 70–99)
Potassium: 4.2 mEq/L (ref 3.5–5.1)
Total Protein: 7.6 g/dL (ref 6.0–8.3)

## 2011-02-02 LAB — CBC
HCT: 44.9 % (ref 36.0–46.0)
Hemoglobin: 15 g/dL (ref 12.0–15.0)
MCHC: 33.4 g/dL (ref 30.0–36.0)
MCV: 86.8 fL (ref 78.0–100.0)
RDW: 14.1 % (ref 11.5–15.5)

## 2011-02-02 LAB — URINALYSIS, ROUTINE W REFLEX MICROSCOPIC
Glucose, UA: NEGATIVE mg/dL
Nitrite: NEGATIVE
Protein, ur: NEGATIVE mg/dL

## 2011-02-02 LAB — DIFFERENTIAL
Basophils Absolute: 0 10*3/uL (ref 0.0–0.1)
Eosinophils Relative: 4 % (ref 0–5)
Lymphocytes Relative: 21 % (ref 12–46)
Lymphs Abs: 1.8 10*3/uL (ref 0.7–4.0)
Neutro Abs: 5.9 10*3/uL (ref 1.7–7.7)
Neutrophils Relative %: 68 % (ref 43–77)

## 2011-02-05 ENCOUNTER — Telehealth: Payer: Self-pay | Admitting: Family Medicine

## 2011-02-05 MED ORDER — BECLOMETHASONE DIPROPIONATE 40 MCG/ACT IN AERS: 2.0000 | INHALATION_SPRAY | Freq: Two times a day (BID) | RESPIRATORY_TRACT | Status: DC

## 2011-02-05 MED ORDER — ALBUTEROL SULFATE HFA 108 (90 BASE) MCG/ACT IN AERS: 2.0000 | INHALATION_SPRAY | RESPIRATORY_TRACT | Status: DC | PRN

## 2011-02-05 NOTE — Telephone Encounter (Signed)
No response from the patient as of today 02-05-11.  Pt may call back at her convenience. Jarvis Newcomer, LPN Domingo Dimes

## 2011-02-05 NOTE — Telephone Encounter (Signed)
[facility] called and requested refill of QVAR and Ventolin MDI.Marland Kitchen  Ventolin MDI was sent electronically, but QVAR not seen in old or new computer pt. chart.  Will route to Dr. Linford Arnold to review for QVAR. Plan:  Routed to Dr. Marlyne Beards, LPN Domingo Dimes

## 2011-02-05 NOTE — Telephone Encounter (Signed)
She is on flovent but we can change her to QVAR if she wants. I changed meds and sent QVAR.

## 2011-02-05 NOTE — Telephone Encounter (Signed)
Acknowledged that med changed to QVAR and was sent by the provider.  Encounter completed. Jarvis Newcomer, LPN Domingo Dimes

## 2011-02-11 ENCOUNTER — Other Ambulatory Visit: Payer: Self-pay | Admitting: *Deleted

## 2011-02-11 ENCOUNTER — Other Ambulatory Visit: Payer: Self-pay | Admitting: Family Medicine

## 2011-02-11 MED ORDER — ALBUTEROL SULFATE HFA 108 (90 BASE) MCG/ACT IN AERS: 2.0000 | INHALATION_SPRAY | RESPIRATORY_TRACT | Status: DC | PRN

## 2011-02-11 NOTE — Telephone Encounter (Signed)
[facility] called with a request for refill of ventolin hfa.  When reviewing the patient chart the refill was already sent on 02-11-11 earlier today. Jarvis Newcomer, LPN Domingo Dimes

## 2011-02-19 ENCOUNTER — Other Ambulatory Visit: Payer: Self-pay | Admitting: Family Medicine

## 2011-02-19 MED ORDER — OMEGA-3-ACID ETHYL ESTERS 1 G PO CAPS: 4.0000 g | ORAL_CAPSULE | Freq: Every day | ORAL | Status: DC

## 2011-02-19 NOTE — Telephone Encounter (Signed)
Pt called and needs prior auth for new med script to go to [facility] for Lovaza.

## 2011-02-19 NOTE — Telephone Encounter (Signed)
Pt needs new script for lovaza 1 gm sent to [facility]. Plan:  Routed to Dr. Marlyne Beards, LPN Domingo Dimes

## 2011-02-19 NOTE — Telephone Encounter (Signed)
I printed the rx.  You will need to get the phone number for the [facility]. I couldn't find it in the computer.

## 2011-02-20 ENCOUNTER — Other Ambulatory Visit: Payer: Self-pay | Admitting: Family Medicine

## 2011-02-20 MED ORDER — IPRATROPIUM-ALBUTEROL 0.5-2.5 (3) MG/3ML IN SOLN: 3.0000 mL | RESPIRATORY_TRACT | Status: DC | PRN

## 2011-02-20 NOTE — Telephone Encounter (Signed)
This prescription was faxed to [facility] after obtaining a fax number from the patient. Jarvis Newcomer, LPN Domingo Dimes

## 2011-02-20 NOTE — Telephone Encounter (Signed)
Pt called and also requested refill of her nebulizer medication for treatments  .Refilled and faxed to [facility] mail order pharmacy. Jarvis Newcomer, LPN Domingo Dimes

## 2011-02-21 ENCOUNTER — Other Ambulatory Visit (INDEPENDENT_AMBULATORY_CARE_PROVIDER_SITE_OTHER): Payer: Medicare Other

## 2011-02-21 ENCOUNTER — Ambulatory Visit (INDEPENDENT_AMBULATORY_CARE_PROVIDER_SITE_OTHER): Payer: Medicare Other | Admitting: Gastroenterology

## 2011-02-21 ENCOUNTER — Encounter: Payer: Self-pay | Admitting: Gastroenterology

## 2011-02-21 DIAGNOSIS — R6889 Other general symptoms and signs: Secondary | ICD-10-CM

## 2011-02-21 DIAGNOSIS — D509 Iron deficiency anemia, unspecified: Secondary | ICD-10-CM

## 2011-02-21 DIAGNOSIS — K5900 Constipation, unspecified: Secondary | ICD-10-CM

## 2011-02-21 DIAGNOSIS — R109 Unspecified abdominal pain: Secondary | ICD-10-CM

## 2011-02-21 DIAGNOSIS — F3131 Bipolar disorder, current episode depressed, mild: Secondary | ICD-10-CM

## 2011-02-21 LAB — HEPATIC FUNCTION PANEL
ALT: 36 U/L — ABNORMAL HIGH (ref 0–35)
Bilirubin, Direct: 0.1 mg/dL (ref 0.0–0.3)
Total Bilirubin: 0.5 mg/dL (ref 0.3–1.2)

## 2011-02-21 LAB — BASIC METABOLIC PANEL
CO2: 30 mEq/L (ref 19–32)
Calcium: 9.6 mg/dL (ref 8.4–10.5)
Creatinine, Ser: 0.8 mg/dL (ref 0.4–1.2)
GFR: 78.48 mL/min (ref >60.00)
Sodium: 137 mEq/L (ref 135–145)

## 2011-02-21 LAB — IBC PANEL
Saturation Ratios: 15.4 % — ABNORMAL LOW (ref 20.0–50.0)
Transferrin: 268.5 mg/dL (ref 212.0–360.0)

## 2011-02-21 LAB — CBC WITH DIFFERENTIAL/PLATELET
Basophils Relative: 0.4 % (ref 0.0–3.0)
Eosinophils Absolute: 0.2 10*3/uL (ref 0.0–0.7)
Eosinophils Relative: 2.8 % (ref 0.0–5.0)
HCT: 40.5 % (ref 36.0–46.0)
Hemoglobin: 13.7 g/dL (ref 12.0–15.0)
MCHC: 33.9 g/dL (ref 30.0–36.0)
MCV: 88.8 fl (ref 78.0–100.0)
Monocytes Absolute: 0.5 10*3/uL (ref 0.1–1.0)
Neutro Abs: 4.8 10*3/uL (ref 1.4–7.7)
RBC: 4.56 Mil/uL (ref 3.87–5.11)
WBC: 7.5 10*3/uL (ref 4.5–10.5)

## 2011-02-21 LAB — SEDIMENTATION RATE: Sed Rate: 17 mm/hr (ref 0–22)

## 2011-02-21 LAB — FOLATE: Folate: >24.8 ng/mL (ref >5.9)

## 2011-02-21 LAB — AMYLASE: Amylase: 39 U/L (ref 27–131)

## 2011-02-21 LAB — TSH: TSH: 1.34 u[IU]/mL (ref 0.35–5.50)

## 2011-02-21 MED ORDER — LUBIPROSTONE 8 MCG PO CAPS: 8.0000 ug | ORAL_CAPSULE | Freq: Two times a day (BID) | ORAL | Status: AC

## 2011-02-21 MED ORDER — PEG-KCL-NACL-NASULF-NA ASC-C 100 G PO SOLR: 1.0000 | Freq: Once | ORAL | Status: DC

## 2011-02-21 NOTE — Progress Notes (Signed)
History of Present Illness:  This is a very complex 65 year old Caucasian female with severe bipolar disorder who has been Dr. shopping for her chronic IBS GI issues. She recently has been under the care of Dr. Zachery Dakins at the [facility] in [place]. A variety of papers have been received which have been reviewed briefly. The patient has had endoscopy within the last year which was unremarkable as was standard GI evaluation for IBS. 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 She had extensive hepatic workup which was unremarkable as was workup for celiac disease. Patient previously been a patient of Dr.Jothi Art therapist. Her history also is positive for multiple areas of abdominal pain with negative endoscopic procedures, and CT scans. Patient also in the past also under the care of Dr. Carman Ching. She is status post cholecystectomy, and ultrasound exams have shown fatty infiltration of her liver. Previous malabsorption workups were also all negative.  She is now has primarily constipation responsive to daily milk of magnesia. She denies rectal bleeding, nausea vomiting, but does have some early satiety and alleged progressive weight loss. She denies any specific food intolerance or systemic complaints or specific hepatobiliary complaints. She relates that her bipolar disease is doing" poorly". She'll Roddy of antidepressants and sedatives, also antihypertensive medications. Her primary care physician is Dr. [name].   I have reviewed this patient's present history, medical and surgical past history, allergies and medications.     ROS: The remainder of the 10 point ROS is negative.. multiple psychiatric issues, she apparently is on the care of a psychiatrist. She denies a current cardiovascular, pulmonary, genitourinary, or  other neurological symptomatology.     Physical Exam: General well developed well nourished patient in no acute distress, appearing her stated age Eyes PERRLA, no icterus, fundoscopic exam per opthamologist Skin no lesions noted Neck supple, no adenopathy, no thyroid enlargement, no tenderness Chest clear to percussion and auscultation Heart no significant murmurs, gallops or rubs noted Abdomen no hepatosplenomegaly masses or tenderness, BS normal. . Extremities no acute joint lesions, edema, phlebitis or evidence of cellulitis. Neurologic patient oriented x 3, cranial nerves intact, no focal neurologic deficits noted. Psychological mental status normal and normal affect.  Assessment and plan: Constipation predominant IBS complicated by severe neuropsychiatric disorders, poor compliance, and Dr. shopping. I reviewed her records in detail and she is due for followup colonoscopy which has been scheduled with propofol sedation. She relates she has had gastric emptying scans which are normal, we will try to obtain these records for review. Cause her constipation we will try Amitiza 8 mcg twice a day samples. Followup labs also ordered. I doubt this patient will be compliant . I also am doubtful that we will in turn cover any new diagnoses to explain her functional GI complaints.  Please copy her primary care physician, referring physician, and pertinent subspecialists. Encounter Diagnoses  Name Primary?  . Abdominal pain   . Constipation   . Iron deficiency anemia, unspecified    . Other general symptoms

## 2011-02-21 NOTE — Patient Instructions (Signed)
Take Amitiza one tablet by mouth twice a day with food. Please go to the basement today for your labs. Your procedure has been scheduled for 02/25/2011, please follow the seperate instructions.

## 2011-02-22 ENCOUNTER — Telehealth: Payer: Self-pay | Admitting: Family Medicine

## 2011-02-22 LAB — CELIAC PANEL 10
Gliadin IgG: 10.4 U/mL (ref <20)
Tissue Transglut Ab: 8.1 U/mL (ref <20)
Tissue Transglutaminase Ab, IgA: 2.7 U/mL (ref <20)

## 2011-02-22 NOTE — Telephone Encounter (Signed)
Call patient: [facility] has been trying to get in touch with her to schedule her colonoscopy. They have been unable to reach her so they sent [place] a letter in hopes that we could help arrange getting her back in for a colonoscopy. If she needs an actual referral will be happy to do this for her or she can just call [facility] at [phone] to schedule her colonoscopy.

## 2011-02-24 LAB — CAROTENE, SERUM: Carotene, Total-Serum: 27 ug/dL (ref 6–77)

## 2011-02-25 ENCOUNTER — Ambulatory Visit (AMBULATORY_SURGERY_CENTER): Payer: Medicare Other | Admitting: Gastroenterology

## 2011-02-25 ENCOUNTER — Encounter: Payer: Self-pay | Admitting: Internal Medicine

## 2011-02-25 ENCOUNTER — Encounter: Payer: Self-pay | Admitting: Gastroenterology

## 2011-02-25 DIAGNOSIS — Z800 Family history of malignant neoplasm of digestive organs: Secondary | ICD-10-CM

## 2011-02-25 DIAGNOSIS — R109 Unspecified abdominal pain: Secondary | ICD-10-CM

## 2011-02-25 DIAGNOSIS — K5900 Constipation, unspecified: Secondary | ICD-10-CM

## 2011-02-25 DIAGNOSIS — K5730 Diverticulosis of large intestine without perforation or abscess without bleeding: Secondary | ICD-10-CM

## 2011-02-25 MED ORDER — SODIUM CHLORIDE 0.9 % IV SOLN: 500.0000 mL | INTRAVENOUS | Status: DC

## 2011-02-25 NOTE — Patient Instructions (Signed)
Follow discharge instructions given (see blue & green sheets)    Information on diverticulosis & high fiber diet given  Information on constipation & irritable bowel syndrome also given  Metamucil or benefiber as directed as needed.

## 2011-02-26 ENCOUNTER — Telehealth: Payer: Self-pay | Admitting: *Deleted

## 2011-02-26 NOTE — Telephone Encounter (Signed)

## 2011-02-27 ENCOUNTER — Other Ambulatory Visit: Payer: Self-pay | Admitting: Family Medicine

## 2011-02-27 MED ORDER — ALBUTEROL SULFATE HFA 108 (90 BASE) MCG/ACT IN AERS: 2.0000 | INHALATION_SPRAY | RESPIRATORY_TRACT | Status: DC | PRN

## 2011-02-27 MED ORDER — METOPROLOL SUCCINATE ER 100 MG PO TB24: 100.0000 mg | ORAL_TABLET | Freq: Every day | ORAL | Status: DC

## 2011-02-27 MED ORDER — LOSARTAN POTASSIUM 25 MG PO TABS: 25.0000 mg | ORAL_TABLET | Freq: Every day | ORAL | Status: DC

## 2011-02-27 NOTE — Telephone Encounter (Signed)
Closed

## 2011-03-04 NOTE — Telephone Encounter (Signed)
Pt informed that [facility] had been trying to get a hold of her to schedule her colonoscopy, and when pt notified she stated she had already had colonoscopy with office [place] @ Dr. Sheryn Bison.  Told pt that she needs to have that office send [place] a copy of that report.  Gave the fax  # ss they could fax the report.  Called [facility] to let them know as well.  Spoke with Crystal at that office. Jarvis Newcomer, LPN Domingo Dimes

## 2011-07-01 ENCOUNTER — Other Ambulatory Visit: Payer: Self-pay | Admitting: Family Medicine

## 2011-07-01 MED ORDER — METOPROLOL SUCCINATE ER 100 MG PO TB24: 100.0000 mg | ORAL_TABLET | Freq: Every day | ORAL | Status: DC

## 2011-07-01 NOTE — Telephone Encounter (Signed)
Pt called and said she only had three days left of her metoprolol and normally uses mail order with [facility] but will not have time to do since she will run out of her medication. Plan:  Reviewed the pt chart and metoprolol refilled and sent to [facility] for #30/[facility] refill. Pt aware. Jarvis Newcomer, LPN Domingo Dimes

## 2011-07-03 ENCOUNTER — Telehealth: Payer: Self-pay | Admitting: Family Medicine

## 2011-07-03 NOTE — Telephone Encounter (Signed)
Pt called and said she went through a overnite oxygen test this week with [facility] and they told her she needed to get in with her PCP as soon as possible.   PLan:  Pt scheduled with Dr. Linford Arnold for this upcoming Friday at 8:15am, and in the interim will call Rolly Salter at [facility] to get the test results faxed over to our office for Dr. Linford Arnold.  Duwayne Heck is personally delivering the test results to our office today. Jarvis Newcomer, LPN Domingo Dimes Routed this encounter to Dr. Linford Arnold so she'll be aware.

## 2011-07-05 ENCOUNTER — Ambulatory Visit (INDEPENDENT_AMBULATORY_CARE_PROVIDER_SITE_OTHER): Payer: Medicare Other | Admitting: Family Medicine

## 2011-07-05 ENCOUNTER — Encounter: Payer: Self-pay | Admitting: Family Medicine

## 2011-07-05 VITALS — BP 116/68 | HR 78 | Wt 227.0 lb

## 2011-07-05 DIAGNOSIS — J45909 Unspecified asthma, uncomplicated: Secondary | ICD-10-CM

## 2011-07-05 DIAGNOSIS — J449 Chronic obstructive pulmonary disease, unspecified: Secondary | ICD-10-CM

## 2011-07-05 DIAGNOSIS — G4734 Idiopathic sleep related nonobstructive alveolar hypoventilation: Secondary | ICD-10-CM

## 2011-07-05 MED ORDER — AMBULATORY NON FORMULARY MEDICATION: Status: DC

## 2011-07-05 MED ORDER — IPRATROPIUM-ALBUTEROL 0.5-2.5 (3) MG/3ML IN SOLN: 3.0000 mL | RESPIRATORY_TRACT | Status: DC | PRN

## 2011-07-05 MED ORDER — EPINEPHRINE 0.3 MG/0.3ML IJ DEVI: 0.3000 mg | INTRAMUSCULAR | Status: DC | PRN

## 2011-07-05 MED ORDER — METOPROLOL SUCCINATE ER 100 MG PO TB24: 100.0000 mg | ORAL_TABLET | Freq: Every day | ORAL | Status: DC

## 2011-07-05 NOTE — Progress Notes (Signed)
  Subjective:    Patient ID: Deborah [name], female    DOB: [birth year], 66 y.o.   MRN: [id number]  HPI Here to discuss her sleep results. Her oxygen level did drop below 88% for almost 6 minutes. She has extreme fatigue in the mornings.  She says often times she will get up and eat breakfast and then had to go back to bed because she feels so tired. She does have a history of COPD as well.  Having congitive impairment and saw Dr. Sandria Manly at [facility].  She is undregoing testing there.  Thus far her testing has been inconclusive. She is to followup with him I believe later this month.  She also recently moved to [facility] and is in the recommendation for a family physician in [place].   Review of Systems     Objective:   Physical Exam  Constitutional: She is oriented to person, place, and time. She appears well-developed and well-nourished.  HENT:  Head: Normocephalic and atraumatic.  Cardiovascular: Normal rate, regular rhythm and normal heart sounds.   Pulmonary/Chest: Effort normal and breath sounds normal.  Abdominal: Soft. Bowel sounds are normal.  Neurological: She is alert and oriented to person, place, and time.  Skin: Skin is warm and dry.  Psychiatric: She has a normal mood and affect. Her behavior is normal.          Assessment & Plan:  Hypoexmia overnight - Filled out paperwork for overnight oxygen, 2 liters.  Hopefully this will improve her fatigue.   F/U in 3 months.  Patient will followup in 2-3 months to see if she is improving. Her COPD is well controlled at this time. She does need a refill on her DuoNeb.  I did give her some recommendations for family physicians in [place]. I did encourage her to try stay within the [facility] network so that they can have full access to her medical records through the electronic system. She lives off a bottle crowns are going to [place] location I might be more comfortable for her.  Cognitive impairment-I encouraged  her to continue followup with Dr. love her neurologist.

## 2011-07-08 ENCOUNTER — Telehealth: Payer: Self-pay | Admitting: *Deleted

## 2011-07-08 NOTE — Telephone Encounter (Signed)
We discussed that her oxygen drops just a little a night (very mild) so that is why we recommend oxygen overnight.  I can happen if she breath holds (but not enough to get an actual sleep study for apnea) or from her COPD/asthma.  Likely multifactorial.

## 2011-07-08 NOTE — Telephone Encounter (Signed)
Pt notified of MD instructions. KJ LPN 

## 2011-07-08 NOTE — Telephone Encounter (Signed)
Pt calls and states that she has low O2 levels and was told by you on Friday that shwe needs to wear O2 at night. Pt forgot to ask while in for visit why this was so and what could cause the low O2 level. Please explain for the patient

## 2011-07-11 ENCOUNTER — Telehealth: Payer: Self-pay | Admitting: *Deleted

## 2011-07-11 DIAGNOSIS — G4736 Sleep related hypoventilation in conditions classified elsewhere: Secondary | ICD-10-CM

## 2011-07-11 NOTE — Telephone Encounter (Signed)
Pt calls and wanted to know if referral done for [facility] to come out with her O2 to wear at night. I do not see an order for this

## 2011-07-11 NOTE — Telephone Encounter (Signed)
Referrral put in. Hopefully they will call her tomorrow.

## 2011-07-22 ENCOUNTER — Encounter: Payer: Self-pay | Admitting: Family Medicine

## 2011-07-24 ENCOUNTER — Encounter: Payer: Self-pay | Admitting: Family Medicine

## 2011-08-05 NOTE — Progress Notes (Signed)
Summary: sinus/asthma/TM   Vital Signs:  Patient Profile:   65 Years Old Female CC:      sinus congestion/drainage, prod. cough, SOB with activity x 1 1/2 weeks Height:     64 inches Weight:      198 pounds O2 Sat:      92 % O2 treatment:    Room Air Temp:     97.5 degrees F oral Pulse rate:   72 / minute Resp:     18 per minute BP sitting:   149 / 87  (left arm) Cuff size:   large  Vitals Entered By: Lajean Saver RN (December 02, 2010 3:32 PM)                  Updated Prior Medication List: TOPROL XL 100 MG XR24H-TAB (METOPROLOL SUCCINATE) Take 1 tablet by mouth once a day LOVAZA 1 GM CAPS (OMEGA-3-ACID ETHYL ESTERS) 4 tabs by mouth daily LUNESTA 3 MG TABS (ESZOPICLONE) one tablet by mouth at at bedtime FLUNISOLIDE 0.025 % SOLN (FLUNISOLIDE) 2 sprays in each nostril daily * COSAMIN ASU joint health supplement. EPIPEN 2-PAK 0.3 MG/0.3ML DEVI (EPINEPHRINE) 1 injection [place] as needed allergic reaction or beestin. LOSARTAN POTASSIUM 25 MG TABS (LOSARTAN POTASSIUM) Take 1 tablet by mouth once a day FLOVENT HFA 110 MCG/ACT AERO (FLUTICASONE PROPIONATE  HFA) one puff inhaled two times a day DUONEB 0.5-2.5 (3) MG/3ML SOLN (IPRATROPIUM-ALBUTEROL) use in nebulizer machine twice a day as needed PROAIR HFA 108 (90 BASE) MCG/ACT AERS (ALBUTEROL SULFATE) 2-4 puffs inhaled very 4-6 hours as needed * NEB AND NEB SUPPLIES Dx. COPD, Asthma  Current Allergies (reviewed today): ! BENTYL ! * ANTISPASMATICS ! * BEE STINGS OMEPRAZOLE (OMEPRAZOLE) WELLBUTRIN (BUPROPION HCL)History of Present Illness Chief Complaint: sinus congestion/drainage, prod. cough, SOB with activity x 1 1/2 weeks History of Present Illness: 65 Years Old Female complains of onset of cold symptoms for a few days.  MICHE has been using no OTC meds but is using her inhaler.  She has a deviated septum and gets this a few times a year.  She has anxiety and is reluctant to see an ENT. No sore throat +cough No pleuritic  pain +/- wheezing + nasal congestion + post-nasal drainage + sinus pain/pressure No chest congestion No itchy/red eyes No earache No hemoptysis No SOB No chills/sweats No fever No nausea No vomiting No abdominal pain No diarrhea No skin rashes No fatigue No myalgias + headache   REVIEW OF SYSTEMS Constitutional Symptoms      Denies fever, chills, night sweats, weight loss, weight gain, and fatigue.  Eyes       Denies change in vision, eye pain, eye discharge, glasses, contact lenses, and eye surgery. Ear/Nose/Throat/Mouth       Complains of frequent runny nose and sinus problems.      Denies hearing loss/aids, change in hearing, ear pain, ear discharge, dizziness, frequent nose bleeds, sore throat, hoarseness, and tooth pain or bleeding.  Respiratory       Complains of productive cough and shortness of breath.      Denies dry cough, wheezing, asthma, bronchitis, and emphysema/COPD.      Comments: with activity Cardiovascular       Denies murmurs, chest pain, and tires easily with exhertion.    Gastrointestinal       Denies stomach pain, nausea/vomiting, diarrhea, constipation, blood in bowel movements, and indigestion. Genitourniary       Denies painful urination, kidney stones, and loss of urinary  control. Neurological       Denies paralysis, seizures, and fainting/blackouts. Musculoskeletal       Denies muscle pain, joint pain, joint stiffness, decreased range of motion, redness, swelling, muscle weakness, and gout.  Skin       Denies bruising, unusual mles/lumps or sores, and hair/skin or nail changes.  Psych       Denies mood changes, temper/anger issues, anxiety/stress, speech problems, depression, and sleep problems.  Past History:  Past Medical History: BIPOLAR DISORDER UNSPECIFIED (ICD-296.80)-- [name], NP DEPRESSION (ICD-311) -Sees [name].   Hyperlipidemia Hypertension asthma  Past Surgical History: Reviewed history from 10/28/2007 and no  changes required. cholecystectomy partial hysterectomy ovarian cystectomy removal of cyst on chest wall  Family History: Reviewed history from 04/03/2009 and no changes required. mother deceased - emphysema father deceased - heart blockage one brother alive and healthy one brother alive and altzheimers Family History of Colon Cancer:Maternal Grandfather   Social History: Reviewed history from 03/23/2008 and no changes required. Retired day Occupational hygienist. Widowed x 1 yr.  Has 5 kids - 1 boy, 4 girls.  Youngest lives w/ her. Never smoked. Denies ETOH. Exercises 3 x a wk. G6P5 Physical Exam General appearance: well developed, well nourished, no acute distress Head: frontal sinus tenderness Ears: mild erythema L TM and L canal slightly swollen, R side normal Nasal: clear discharge Oral/Pharynx: clear PND, no erythema Chest/Lungs: no rales, wheezes, or rhonchi bilateral, breath sounds equal without effort Heart: regular rate and  rhythm, no murmur MSE: oriented to time, place, and person  Plan New Medications/Changes: AUGMENTIN 875-125 MG TABS (AMOXICILLIN-POT CLAVULANATE) 1 by mouth two times a day for 2 weeks  #28 x 8, 12/02/2010, Hoyt Koch MD  Planning Comments:   1)  Take the prescribed antibiotic as instructed. 2)  Use nasal saline solution (over the counter) at least 3 times a day. 3)  Use over the counter decongestants like Zyrtec-D every 12 hours as needed to help with congestion.  Continue your inhaler. 4)  Can take tylenol every 6 hours or motrin every 8 hours for pain or fever. 5)  Follow up with your primary doctor  if no improvement in 5-7 days, sooner if increasing pain, fever, or new symptoms.  I think she should see an ENT.  Despite her agoraphobia, would likely benefit her to see one and be worked up to prevent having to be on antibiotics so often.  Gave her a phone number to try.    The patient and/or caregiver has been counseled thoroughly with regard  to medications prescribed including dosage, schedule, interactions, rationale for use, and possible side effects and they verbalize understanding.  Diagnoses and expected course of recovery discussed and will return if not improved as expected or if the condition worsens. Patient and/or caregiver verbalized understanding.  Prescriptions: AUGMENTIN 875-125 MG TABS (AMOXICILLIN-POT CLAVULANATE) 1 by mouth two times a day for 2 weeks  #28 x 8   Entered and Authorized by:   Hoyt Koch MD   Signed by:   Hoyt Koch MD on 12/02/2010   Method used:   Print then Give to Patient   RxID:   [id number]

## 2011-10-24 ENCOUNTER — Encounter: Payer: Self-pay | Admitting: Family Medicine

## 2011-10-24 ENCOUNTER — Ambulatory Visit (INDEPENDENT_AMBULATORY_CARE_PROVIDER_SITE_OTHER): Payer: Medicare Other | Admitting: Family Medicine

## 2011-10-24 DIAGNOSIS — E782 Mixed hyperlipidemia: Secondary | ICD-10-CM

## 2011-10-24 DIAGNOSIS — E559 Vitamin D deficiency, unspecified: Secondary | ICD-10-CM

## 2011-10-24 DIAGNOSIS — E871 Hypo-osmolality and hyponatremia: Secondary | ICD-10-CM

## 2011-10-24 LAB — COMPREHENSIVE METABOLIC PANEL
ALT: 28 U/L (ref 0–35)
AST: 27 U/L (ref 0–37)
Albumin: 4.3 g/dL (ref 3.5–5.2)
Alkaline Phosphatase: 46 U/L (ref 39–117)
BUN: 14 mg/dL (ref 6–23)
CO2: 28 mEq/L (ref 19–32)
Calcium: 9.5 mg/dL (ref 8.4–10.5)
Chloride: 101 mEq/L (ref 96–112)
Creat: 0.88 mg/dL (ref 0.50–1.10)
Glucose, Bld: 90 mg/dL (ref 70–99)
Potassium: 4.3 mEq/L (ref 3.5–5.3)
Sodium: 139 mEq/L (ref 135–145)
Total Bilirubin: 0.4 mg/dL (ref 0.3–1.2)
Total Protein: 6.9 g/dL (ref 6.0–8.3)

## 2011-10-24 NOTE — Progress Notes (Signed)
Deborah [name] comes in today for a Vitamin D level check.  She is being seen at [facility] for Depression.  She has 4 daughters, one Tobi Bastos who lives with her.  Husband Molly Maduro died a couple years ago.  Yamaris is very religious.  She does not feel loved. 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  One daughter lives in [place] who has 6 children (missionary):  Bright.  She teaches and runs a pizza parlor. One daughter Lurena Joiner lives here is pregnant.  Her partner has three kids. Darral Dash works at [employer] has three kids.

## 2011-10-25 LAB — VITAMIN D 25 HYDROXY (VIT D DEFICIENCY, FRACTURES): Vit D, 25-Hydroxy: 41 ng/mL (ref 30–89)

## 2011-10-29 ENCOUNTER — Encounter (HOSPITAL_COMMUNITY): Payer: Self-pay | Admitting: Emergency Medicine

## 2011-10-29 ENCOUNTER — Emergency Department (HOSPITAL_COMMUNITY)
Admission: EM | Admit: 2011-10-29 | Discharge: 2011-10-30 | Disposition: A | Discharge status: Home / Self Care | Payer: Medicare Other | Attending: Emergency Medicine | Admitting: Emergency Medicine

## 2011-10-29 DIAGNOSIS — F319 Bipolar disorder, unspecified: Secondary | ICD-10-CM | POA: Insufficient documentation

## 2011-10-29 DIAGNOSIS — I10 Essential (primary) hypertension: Secondary | ICD-10-CM | POA: Insufficient documentation

## 2011-10-29 DIAGNOSIS — K7689 Other specified diseases of liver: Secondary | ICD-10-CM | POA: Insufficient documentation

## 2011-10-29 DIAGNOSIS — K219 Gastro-esophageal reflux disease without esophagitis: Secondary | ICD-10-CM | POA: Insufficient documentation

## 2011-10-29 DIAGNOSIS — M79673 Pain in unspecified foot: Secondary | ICD-10-CM

## 2011-10-29 DIAGNOSIS — J449 Chronic obstructive pulmonary disease, unspecified: Secondary | ICD-10-CM | POA: Insufficient documentation

## 2011-10-29 DIAGNOSIS — J4489 Other specified chronic obstructive pulmonary disease: Secondary | ICD-10-CM | POA: Insufficient documentation

## 2011-10-29 DIAGNOSIS — E785 Hyperlipidemia, unspecified: Secondary | ICD-10-CM | POA: Insufficient documentation

## 2011-10-29 DIAGNOSIS — G609 Hereditary and idiopathic neuropathy, unspecified: Secondary | ICD-10-CM | POA: Insufficient documentation

## 2011-10-29 DIAGNOSIS — I517 Cardiomegaly: Secondary | ICD-10-CM | POA: Insufficient documentation

## 2011-10-29 DIAGNOSIS — M79609 Pain in unspecified limb: Secondary | ICD-10-CM | POA: Insufficient documentation

## 2011-10-29 NOTE — ED Notes (Signed)
Pt alert, nad, c/o left foot, pain, swelling, onset several days ago, denies trauma or injury, resp even unlabored, skin pwd

## 2011-10-30 ENCOUNTER — Emergency Department (HOSPITAL_COMMUNITY): Payer: Medicare Other

## 2011-10-30 MED ORDER — SULFAMETHOXAZOLE-TRIMETHOPRIM 400-80 MG PO TABS: 1.0000 | ORAL_TABLET | Freq: Two times a day (BID) | ORAL | Status: AC

## 2011-10-30 NOTE — ED Provider Notes (Signed)
History     CSN: [id number]  Arrival date & time 10/29/11  2138   First MD Initiated Contact with Patient 10/30/11 0149      Chief Complaint  Patient presents with  . Foot Pain   patient with a known history of peripheral neuropathy, as well as bipolar, depression, hypertension, and other medical problems. She has noticed redness pain to her left great toe, over the past 3 days. However, she has had no fevers, no nausea, vomiting. No new weakness, numbness or tingling. Patient states the pain is moderate. She has no open sores. No recent injuries or trauma. Denies any other complaints at this time. She is taking ibuprofen at home, which is controlling her pain.  (Consider location/radiation/quality/duration/timing/severity/associated sxs/prior treatment) HPI  Past Medical History  Diagnosis Date  . Bipolar disorder     Deborah Fordyce, NP  . Depression   . Hyperlipidemia   . Hypertension   . Hiatal hernia   . Esophageal reflux   . Other specified disorders of liver   . Headache   . Hepatomegaly   . Other chronic nonalcoholic liver disease   . Cardiomegaly   . Irritable bowel syndrome   . Diverticulosis of colon (without mention of hemorrhage)   . COPD (chronic obstructive pulmonary disease)   . Nausea     Past Surgical History  Procedure Date  . Cholecystectomy   . Abdominal hysterectomy     partial  . Ovarian cyst removal   . Removal of cyst on chest wall   . Appendectomy     Family History  Problem Relation Age of Onset  . Emphysema Mother   . Heart disease Father   . Colon cancer Maternal Grandfather   . Alzheimer's disease Brother     History  Substance Use Topics  . Smoking status: Never Smoker   . Smokeless tobacco: Never Used  . Alcohol Use: No    OB History    Grav Para Term Preterm Abortions TAB SAB Ect Mult Living                  Review of Systems  All other systems reviewed and are negative.    Allergies  Bentyl; Bupropion hcl;  Dicyclomine hcl; Omeprazole; and Statins  Home Medications   Current Outpatient Rx  Name Route Sig Dispense Refill  . AMBULATORY NON FORMULARY MEDICATION  Medication Name: Zostavax IM x 1 . 1 vial 0  . ASTEPRO NA Nasal Place into the nose. As directed     . BECLOMETHASONE DIPROPIONATE 40 MCG/ACT IN AERS Inhalation Inhale 2 puffs into the lungs 2 (two) times daily. 1 Inhaler 12  . BUPROPION HCL ER (SR) 150 MG PO TB12 Oral Take 150 mg by mouth daily.      Marland Kitchen CLONAZEPAM 1 MG PO TABS Oral Take 1 mg by mouth 3 (three) times daily as needed.      . CYANOCOBALAMIN 500 MCG/0.1ML NA SOLN Nasal Place into the nose. As directed     . EPINEPHRINE 0.3 MG/0.3ML IJ DEVI Intramuscular Inject 0.3 mLs (0.3 mg total) into the muscle as needed. For allergic reaction to beesting 1 Device 0  . ESZOPICLONE 2 MG PO TABS Oral Take 2 mg by mouth at bedtime. Take immediately before bedtime     . IPRATROPIUM-ALBUTEROL 0.5-2.5 (3) MG/3ML IN SOLN Nebulization Take 3 mLs by nebulization every 4 (four) hours as needed. 1080 mL 1  . MILK OF MAGNESIA PO Oral Take by mouth. As needed     .  METOPROLOL SUCCINATE ER 100 MG PO TB24 Oral Take 1 tablet (100 mg total) by mouth daily. 90 tablet 1  . COSAMIN ASU ADVANCED FORMULA PO Oral Take by mouth.      . NON FORMULARY  Neb and Neb supplies- DX COPD, asthma     . OMEGA-3-ACID ETHYL ESTERS 1 G PO CAPS Oral Take 4 capsules (4 g total) by mouth daily. Take 4 tabs 360 capsule 3    BP 142/71  Pulse 78  Temp 98 F (36.7 C)  Resp 16  SpO2 99%  Physical Exam  Nursing note and vitals reviewed. Constitutional: She is oriented to person, place, and time. She appears well-developed and well-nourished.  HENT:  Head: Normocephalic and atraumatic.  Eyes: Conjunctivae and EOM are normal. Pupils are equal, round, and reactive to light.  Neck: Neck supple.  Cardiovascular: Normal rate and regular rhythm.  Exam reveals no gallop and no friction rub.   No murmur heard. Pulmonary/Chest:  Breath sounds normal. She has no wheezes. She has no rales. She exhibits no tenderness.  Abdominal: Soft. Bowel sounds are normal. She exhibits no distension. There is no tenderness. There is no rebound and no guarding.  Musculoskeletal: Normal range of motion. She exhibits edema and tenderness.       Moderate redness to left great toe. No significant warmth. No open sores. Peripheral pulses are normal. Refill is normal. No crepitance.  Neurological: She is alert and oriented to person, place, and time. No cranial nerve deficit. Coordination normal.  Skin: Skin is warm and dry. No rash noted.  Psychiatric: She has a normal mood and affect.    ED Course  Procedures (including critical care time)  Labs Reviewed - No data to display No results found.   No diagnosis found.    MDM  Patient is seen and examined, initial history and physical is completed. Evaluation initiated      Results for orders placed in visit on 10/24/11  VITAMIN D 25 HYDROXY      Component Value Range   Vit D, 25-Hydroxy 41  30 - 89 (ng/mL)  COMPREHENSIVE METABOLIC PANEL      Component Value Range   Sodium 139  135 - 145 (mEq/L)   Potassium 4.3  3.5 - 5.3 (mEq/L)   Chloride 101  96 - 112 (mEq/L)   CO2 28  19 - 32 (mEq/L)   Glucose, Bld 90  70 - 99 (mg/dL)   BUN 14  6 - 23 (mg/dL)   Creat 1.61  0.96 - 0.45 (mg/dL)   Total Bilirubin 0.4  0.3 - 1.2 (mg/dL)   Alkaline Phosphatase 46  39 - 117 (U/L)   AST 27  0 - 37 (U/L)   ALT 28  0 - 35 (U/L)   Total Protein 6.9  6.0 - 8.3 (g/dL)   Albumin 4.3  3.5 - 5.2 (g/dL)   Calcium 9.5  8.4 - 40.9 (mg/dL)   Dg Foot Complete Left  10/30/2011  *RADIOLOGY REPORT*  Clinical Data: Foot is red and swollen.  Pain in the first metatarsal area.  LEFT FOOT - COMPLETE 3+ VIEW  Comparison: None.  Findings: Three views of the left foot were obtained.  There is soft tissue swelling or fullness throughout the foot.  Prominent degenerative changes along the dorsal aspect of the  midfoot.  No evidence for acute fracture or dislocation.  Mild spurring along the plantar aspect the calcaneus.  IMPRESSION: No acute bony abnormality in the left foot.  Degenerative changes along the dorsal aspect of the foot.  Original Report Authenticated By: Richarda Overlie, M.D.     X-rays reviewed. Negative for any bony abnormality.   Will treat for gout, and the possibility of mild early cellulitis, will give a short course of Bactrim. Patient can followup with her primary physician or with the podiatrist later this week.  Jacobi Nile A. Patrica Duel, MD 10/30/11 [phone]

## 2011-10-30 NOTE — Discharge Instructions (Signed)
Cellulitis Cellulitis is an infection of the skin and the tissue beneath it. The area is typically red and tender. It is caused by germs (bacteria) (usually staph or strep) that enter the body through cuts or sores. Cellulitis most commonly occurs in the arms or lower legs.  HOME CARE INSTRUCTIONS   If you are given a prescription for medications which kill germs (antibiotics), take as directed until finished.   If the infection is on the arm or leg, keep the limb elevated as able.   Use a warm cloth several times per day to relieve pain and encourage healing.   See your caregiver for recheck of the infected site as directed if problems arise.   Only take over-the-counter or prescription medicines for pain, discomfort, or fever as directed by your caregiver.  SEEK MEDICAL CARE IF:   The area of redness (inflammation) is spreading, there are red streaks coming from the infected site, or if a part of the infection begins to turn dark in color.   The joint or bone underneath the infected skin becomes painful after the skin has healed.   The infection returns in the same or another area after it seems to have gone away.   A boil or bump swells up. This may be an abscess.   New, unexplained problems such as pain or fever develop.  SEEK IMMEDIATE MEDICAL CARE IF:   You have a fever.   You or your child feels drowsy or lethargic.   There is vomiting, diarrhea, or lasting discomfort or feeling ill (malaise) with muscle aches and pains.  MAKE SURE YOU:   Understand these instructions.   Will watch your condition.   Will get help right away if you are not doing well or get worse.  Document Released: 05/29/2005 Document Revised: 05/01/2011 Document Reviewed: 04/06/2008 [facility] Patient Information 2012 [place], [place].Gout Gout is an inflammatory condition (arthritis) caused by a buildup of uric acid crystals in the joints. Uric acid is a chemical that is normally present in the blood.  Under some circumstances, uric acid can form into crystals in your joints. This causes joint redness, soreness, and swelling (inflammation). Repeat attacks are common. Over time, uric acid crystals can form into masses (tophi) near a joint, causing disfigurement. Gout is treatable and often preventable. CAUSES  The disease begins with elevated levels of uric acid in the blood. Uric acid is produced by your body when it breaks down a naturally found substance called purines. This also happens when you eat certain foods such as meats and fish. Causes of an elevated uric acid level include:  Being passed down from parent to child (heredity).   Diseases that cause increased uric acid production (obesity, psoriasis, some cancers).   Excessive alcohol use.   Diet, especially diets rich in meat and seafood.   Medicines, including certain cancer-fighting drugs (chemotherapy), diuretics, and aspirin.   Chronic kidney disease. The kidneys are no longer able to remove uric acid well.   Problems with metabolism.  Conditions strongly associated with gout include:  Obesity.   High blood pressure.   High cholesterol.   Diabetes.  Not everyone with elevated uric acid levels gets gout. It is not understood why some people get gout and others do not. Surgery, joint injury, and eating too much of certain foods are some of the factors that can lead to gout. SYMPTOMS   An attack of gout comes on quickly. It causes intense pain with redness, swelling, and warmth in  a joint.   Fever can occur.   Often, only one joint is involved. Certain joints are more commonly involved:   Base of the big toe.   Knee.   Ankle.   Wrist.   Finger.  Without treatment, an attack usually goes away in a few days to weeks. Between attacks, you usually will not have symptoms, which is different from many other forms of arthritis. DIAGNOSIS  Your caregiver will suspect gout based on your symptoms and exam. Removal of  fluid from the joint (arthrocentesis) is done to check for uric acid crystals. Your caregiver will give you a medicine that numbs the area (local anesthetic) and use a needle to remove joint fluid for exam. Gout is confirmed when uric acid crystals are seen in joint fluid, using a special microscope. Sometimes, blood, urine, and X-ray tests are also used. TREATMENT  There are 2 phases to gout treatment: treating the sudden onset (acute) attack and preventing attacks (prophylaxis). Treatment of an Acute Attack  Medicines are used. These include anti-inflammatory medicines or steroid medicines.   An injection of steroid medicine into the affected joint is sometimes necessary.   The painful joint is rested. Movement can worsen the arthritis.   You may use warm or cold treatments on painful joints, depending which works best for you.   Discuss the use of coffee, vitamin C, or cherries with your caregiver. These may be helpful treatment options.  Treatment to Prevent Attacks After the acute attack subsides, your caregiver may advise prophylactic medicine. These medicines either help your kidneys eliminate uric acid from your body or decrease your uric acid production. You may need to stay on these medicines for a very long time. The early phase of treatment with prophylactic medicine can be associated with an increase in acute gout attacks. For this reason, during the first few months of treatment, your caregiver may also advise you to take medicines usually used for acute gout treatment. Be sure you understand your caregiver's directions. You should also discuss dietary treatment with your caregiver. Certain foods such as meats and fish can increase uric acid levels. Other foods such as dairy can decrease levels. Your caregiver can give you a list of foods to avoid. HOME CARE INSTRUCTIONS   Do not take aspirin to relieve pain. This raises uric acid levels.   Only take over-the-counter or  prescription medicines for pain, discomfort, or fever as directed by your caregiver.   Rest the joint as much as possible. When in bed, keep sheets and blankets off painful areas.   Keep the affected joint raised (elevated).   Use crutches if the painful joint is in your leg.   Drink enough water and fluids to keep your urine clear or pale yellow. This helps your body get rid of uric acid. Do not drink alcoholic beverages. They slow the passage of uric acid.   Follow your caregiver's dietary instructions. Pay careful attention to the amount of protein you eat. Your daily diet should emphasize fruits, vegetables, whole grains, and fat-free or low-fat milk products.   Maintain a healthy body weight.  SEEK MEDICAL CARE IF:   You have an oral temperature above 102 F (38.9 C).   You develop diarrhea, vomiting, or any side effects from medicines.   You do not feel better in 24 hours, or you are getting worse.  SEEK IMMEDIATE MEDICAL CARE IF:   Your joint becomes suddenly more tender and you have:   Chills.  An oral temperature above 102 F (38.9 C), not controlled by medicine.  MAKE SURE YOU:   Understand these instructions.   Will watch your condition.   Will get help right away if you are not doing well or get worse.  Document Released: 08/16/2000 Document Revised: 05/01/2011 Document Reviewed: 11/27/2009 [facility] Patient Information 2012 [place], [place].

## 2011-11-07 ENCOUNTER — Encounter: Payer: Self-pay | Admitting: Family Medicine

## 2011-11-10 ENCOUNTER — Ambulatory Visit (INDEPENDENT_AMBULATORY_CARE_PROVIDER_SITE_OTHER): Payer: Medicare Other | Admitting: Internal Medicine

## 2011-11-10 ENCOUNTER — Encounter: Payer: Self-pay | Admitting: Internal Medicine

## 2011-11-10 DIAGNOSIS — M109 Gout, unspecified: Secondary | ICD-10-CM

## 2011-11-10 DIAGNOSIS — M79673 Pain in unspecified foot: Secondary | ICD-10-CM

## 2011-11-10 DIAGNOSIS — Z7189 Other specified counseling: Secondary | ICD-10-CM

## 2011-11-10 DIAGNOSIS — I10 Essential (primary) hypertension: Secondary | ICD-10-CM

## 2011-11-10 DIAGNOSIS — M79609 Pain in unspecified limb: Secondary | ICD-10-CM

## 2011-11-10 DIAGNOSIS — Z79899 Other long term (current) drug therapy: Secondary | ICD-10-CM

## 2011-11-10 LAB — POCT CBC
HCT, POC: 40.9 % (ref 37.7–47.9)
MCH, POC: 28.6 pg (ref 27–31.2)
MCV: 90 fL (ref 80–97)
MID (cbc): 0.5 (ref 0–0.9)
POC LYMPH PERCENT: 34.6 %L (ref 10–50)
Platelet Count, POC: 319 10*3/uL (ref 142–424)
RBC: 4.54 M/uL (ref 4.04–5.48)
RDW, POC: 14.3 %
WBC: 6.9 10*3/uL (ref 4.6–10.2)

## 2011-11-10 MED ORDER — PREDNISONE 10 MG PO TABS: ORAL_TABLET | ORAL | Status: DC

## 2011-11-10 NOTE — Progress Notes (Signed)
  Subjective:    Patient ID: Deborah [name], female    DOB: 1945/12/07, 66 y.o.   MRN: [id number]  HPI  See complex problem and med lists . Reviewed with her. C/o persistent pain 1st MTPJ after dx and tx in ER 2 weeks ago. Only rx found was bactrim ds she finished. No other joints invoved and overall is stable and feels good.  Review of Systems Comlex    Objective:   Physical Exam  Left foot 1st MTPJ mildly red and tender  Results for orders placed in visit on 11/10/11  POCT CBC      Component Value Range   WBC 6.9  4.6 - 10.2 (K/uL)   Lymph, poc 2.4  0.6 - 3.4    POC LYMPH PERCENT 34.6  10 - 50 (%L)   MID (cbc) 0.5  0 - 0.9    POC MID % 7.9  0 - 12 (%M)   POC Granulocyte 4.0  2 - 6.9    Granulocyte percent 57.5  37 - 80 (%G)   RBC 4.54  4.04 - 5.48 (M/uL)   Hemoglobin 13.0  12.2 - 16.2 (g/dL)   HCT, POC 40.9  81.1 - 47.9 (%)   MCV 90.0  80 - 97 (fL)   MCH, POC 28.6  27 - 31.2 (pg)   MCHC 31.8  31.8 - 35.4 (g/dL)   RDW, POC 91.4     Platelet Count, POC 319  142 - 424 (K/uL)   MPV 8.2  0 - 99.8 (fL)       Assessment & Plan:   Slowly resolving gout. Prednisone taper 40,30, 20, 10 8d taper.  Labs drawn

## 2011-11-10 NOTE — Patient Instructions (Signed)
Gout Gout is an inflammatory condition (arthritis) caused by a buildup of uric acid crystals in the joints. Uric acid is a chemical that is normally present in the blood. Under some circumstances, uric acid can form into crystals in your joints. This causes joint redness, soreness, and swelling (inflammation). Repeat attacks are common. Over time, uric acid crystals can form into masses (tophi) near a joint, causing disfigurement. Gout is treatable and often preventable. CAUSES  The disease begins with elevated levels of uric acid in the blood. Uric acid is produced by your body when it breaks down a naturally found substance called purines. This also happens when you eat certain foods such as meats and fish. Causes of an elevated uric acid level include:  Being passed down from parent to child (heredity).   Diseases that cause increased uric acid production (obesity, psoriasis, some cancers).   Excessive alcohol use.   Diet, especially diets rich in meat and seafood.   Medicines, including certain cancer-fighting drugs (chemotherapy), diuretics, and aspirin.   Chronic kidney disease. The kidneys are no longer able to remove uric acid well.   Problems with metabolism.  Conditions strongly associated with gout include:  Obesity.   High blood pressure.   High cholesterol.   Diabetes.  Not everyone with elevated uric acid levels gets gout. It is not understood why some people get gout and others do not. Surgery, joint injury, and eating too much of certain foods are some of the factors that can lead to gout. SYMPTOMS   An attack of gout comes on quickly. It causes intense pain with redness, swelling, and warmth in a joint.   Fever can occur.   Often, only one joint is involved. Certain joints are more commonly involved:   Base of the big toe.   Knee.   Ankle.   Wrist.   Finger.  Without treatment, an attack usually goes away in a few days to weeks. Between attacks, you  usually will not have symptoms, which is different from many other forms of arthritis. DIAGNOSIS  Your caregiver will suspect gout based on your symptoms and exam. Removal of fluid from the joint (arthrocentesis) is done to check for uric acid crystals. Your caregiver will give you a medicine that numbs the area (local anesthetic) and use a needle to remove joint fluid for exam. Gout is confirmed when uric acid crystals are seen in joint fluid, using a special microscope. Sometimes, blood, urine, and X-ray tests are also used. TREATMENT  There are 2 phases to gout treatment: treating the sudden onset (acute) attack and preventing attacks (prophylaxis). Treatment of an Acute Attack  Medicines are used. These include anti-inflammatory medicines or steroid medicines.   An injection of steroid medicine into the affected joint is sometimes necessary.   The painful joint is rested. Movement can worsen the arthritis.   You may use warm or cold treatments on painful joints, depending which works best for you.   Discuss the use of coffee, vitamin C, or cherries with your caregiver. These may be helpful treatment options.  Treatment to Prevent Attacks After the acute attack subsides, your caregiver may advise prophylactic medicine. These medicines either help your kidneys eliminate uric acid from your body or decrease your uric acid production. You may need to stay on these medicines for a very long time. The early phase of treatment with prophylactic medicine can be associated with an increase in acute gout attacks. For this reason, during the first few months   of treatment, your caregiver may also advise you to take medicines usually used for acute gout treatment. Be sure you understand your caregiver's directions. You should also discuss dietary treatment with your caregiver. Certain foods such as meats and fish can increase uric acid levels. Other foods such as dairy can decrease levels. Your caregiver  can give you a list of foods to avoid. HOME CARE INSTRUCTIONS   Do not take aspirin to relieve pain. This raises uric acid levels.   Only take over-the-counter or prescription medicines for pain, discomfort, or fever as directed by your caregiver.   Rest the joint as much as possible. When in bed, keep sheets and blankets off painful areas.   Keep the affected joint raised (elevated).   Use crutches if the painful joint is in your leg.   Drink enough water and fluids to keep your urine clear or pale yellow. This helps your body get rid of uric acid. Do not drink alcoholic beverages. They slow the passage of uric acid.   Follow your caregiver's dietary instructions. Pay careful attention to the amount of protein you eat. Your daily diet should emphasize fruits, vegetables, whole grains, and fat-free or low-fat milk products.   Maintain a healthy body weight.  SEEK MEDICAL CARE IF:   You have an oral temperature above 102 F (38.9 C).   You develop diarrhea, vomiting, or any side effects from medicines.   You do not feel better in 24 hours, or you are getting worse.  SEEK IMMEDIATE MEDICAL CARE IF:   Your joint becomes suddenly more tender and you have:   Chills.   An oral temperature above 102 F (38.9 C), not controlled by medicine.  MAKE SURE YOU:   Understand these instructions.   Will watch your condition.   Will get help right away if you are not doing well or get worse.  Document Released: 08/16/2000 Document Revised: 08/08/2011 Document Reviewed: 11/27/2009 [employer] Patient Information 2012 [employer]. 

## 2011-11-11 LAB — COMPREHENSIVE METABOLIC PANEL
CO2: 27 mEq/L (ref 19–32)
Calcium: 9.6 mg/dL (ref 8.4–10.5)
Chloride: 100 mEq/L (ref 96–112)
Creat: 0.79 mg/dL (ref 0.50–1.10)
Glucose, Bld: 101 mg/dL — ABNORMAL HIGH (ref 70–99)
Total Bilirubin: 0.4 mg/dL (ref 0.3–1.2)
Total Protein: 6.8 g/dL (ref 6.0–8.3)

## 2011-11-11 LAB — URIC ACID: Uric Acid, Serum: 7.4 mg/dL — ABNORMAL HIGH (ref 2.4–7.0)

## 2011-11-13 ENCOUNTER — Telehealth: Payer: Self-pay

## 2011-11-13 NOTE — Telephone Encounter (Signed)
PT THOUGHT THAT SHE HAD MORE LABS DONE OTHER THAN WHAT WAS TOLD TO HER.  ADVISED OF THE LABS THAT WERE DONE AT OFFICE VISIT

## 2011-11-13 NOTE — Telephone Encounter (Signed)
Pt left an unclear message at the billing office in error, she may be returning a call but I am unsure. She wants to know if Dr. Elbert Ewings has recently tested the salt in her blood as she has a problem with this.

## 2011-11-13 NOTE — Telephone Encounter (Signed)
LMOM TO CB 

## 2011-11-13 NOTE — Telephone Encounter (Signed)
Pt left at message at the billing office in error, may have been returning our call but it is not clear. Wants to know if Dr. Elbert Ewings tested the salt in her blood as she has a problem with this.

## 2011-11-24 ENCOUNTER — Ambulatory Visit (INDEPENDENT_AMBULATORY_CARE_PROVIDER_SITE_OTHER): Payer: Medicare Other | Admitting: Family Medicine

## 2011-11-24 VITALS — BP 128/88 | HR 98 | Temp 98.4°F | Resp 20 | Ht 64.0 in | Wt 234.0 lb

## 2011-11-24 DIAGNOSIS — E870 Hyperosmolality and hypernatremia: Secondary | ICD-10-CM

## 2011-11-24 DIAGNOSIS — J019 Acute sinusitis, unspecified: Secondary | ICD-10-CM

## 2011-11-24 MED ORDER — HYDROCODONE-HOMATROPINE 5-1.5 MG/5ML PO SYRP: 5.0000 mL | ORAL_SOLUTION | Freq: Three times a day (TID) | ORAL | Status: AC | PRN

## 2011-11-24 MED ORDER — AMOXICILLIN 875 MG PO TABS: 875.0000 mg | ORAL_TABLET | Freq: Two times a day (BID) | ORAL | Status: AC

## 2011-11-24 NOTE — Progress Notes (Signed)
  Subjective:    Patient ID: Deborah [name], female    DOB: [birth year], 66 y.o.   MRN: [id number]  HPI 66yo female with multiple medical problems including COPD and gout here for several reasons:  1) URI symptoms - head congestion, cough for 4 days.  Sore throat initially.  Some ear discomfort.  Productive yellow/green phlegm.  Cough keeps her awake at night.  Trying alka-seltzer cold med, no help.  Nothing else.  Nonsmoker.    2) gout - given prednisone at last office visit.  Pain is better.  Still reddish but swelling and pain gone.    Review of Systems Negative except as per HPI     Objective:   Physical Exam  Constitutional: She appears well-developed. No distress.  HENT:  Right Ear: Tympanic membrane, external ear and ear canal normal. Tympanic membrane is not injected, not scarred, not perforated, not erythematous, not retracted and not bulging.  Left Ear: Tympanic membrane, external ear and ear canal normal. Tympanic membrane is not injected, not scarred, not perforated, not erythematous, not retracted and not bulging.  Nose: Mucosal edema present. No rhinorrhea. Right sinus exhibits maxillary sinus tenderness and frontal sinus tenderness. Left sinus exhibits no maxillary sinus tenderness and no frontal sinus tenderness.  Mouth/Throat: Uvula is midline, oropharynx is clear and moist and mucous membranes are normal. No oropharyngeal exudate or tonsillar abscesses.  Cardiovascular: Normal rate, regular rhythm, normal heart sounds and intact distal pulses.   No murmur heard. Pulmonary/Chest: Effort normal and breath sounds normal. No respiratory distress. She has no wheezes. She has no rales.  Lymphadenopathy:       Head (right side): No submandibular and no preauricular adenopathy present.       Head (left side): No submandibular and no preauricular adenopathy present.    She has cervical adenopathy.       Right cervical: Superficial cervical adenopathy present. No posterior  cervical adenopathy present.      Left cervical: No superficial cervical and no posterior cervical adenopathy present.       Right: No supraclavicular adenopathy present.       Left: No supraclavicular adenopathy present.  Skin: Skin is warm and dry.          Assessment & Plan:  Sinusitis - amox, hycodan

## 2011-11-24 NOTE — Patient Instructions (Signed)
Thank you for coming in today.  I appreciate your patience as we become more comfortable with our computer system.  Today you saw Ardeen Garland, MD. I hope you feel better quickly. If you were not given printed prescriptions today, your medications have been sent to your specified pharmacy and can be picked up there.  Please review the information below regarding your diagnosis(es) at your leisure.     Sinusitis Sinuses are air pockets within the bones of your face. The growth of bacteria within a sinus leads to infection. The infection prevents the sinuses from draining. This infection is called sinusitis. SYMPTOMS  There will be different areas of pain depending on which sinuses have become infected.  The maxillary sinuses often produce pain beneath the eyes.   Frontal sinusitis may cause pain in the middle of the forehead and above the eyes.  Other problems (symptoms) include:  Toothaches.   Colored, pus-like (purulent) drainage from the nose.   Swelling, warmth, and tenderness over the sinus areas may be signs of infection.  TREATMENT  Sinusitis is most often determined by an exam.X-rays may be taken. If x-rays have been taken, make sure you obtain your results or find out how you are to obtain them. Your caregiver may give you medications (antibiotics). These are medications that will help kill the bacteria causing the infection. You may also be given a medication (decongestant) that helps to reduce sinus swelling.  HOME CARE INSTRUCTIONS   Only take over-the-counter or prescription medicines for pain, discomfort, or fever as directed by your caregiver.   Drink extra fluids. Fluids help thin the mucus so your sinuses can drain more easily.   Applying either moist heat or ice packs to the sinus areas may help relieve discomfort.   Use saline nasal sprays to help moisten your sinuses. The sprays can be found at your local drugstore.  SEEK IMMEDIATE MEDICAL CARE IF:  You have a  fever.   You have increasing pain, severe headaches, or toothache.   You have nausea, vomiting, or drowsiness.   You develop unusual swelling around the face or trouble seeing.  MAKE SURE YOU:   Understand these instructions.   Will watch your condition.   Will get help right away if you are not doing well or get worse.  Document Released: 08/19/2005 Document Revised: 08/08/2011 Document Reviewed: 03/18/2007 [facility] Patient Information 2012 [place], [place].

## 2011-12-04 ENCOUNTER — Ambulatory Visit (INDEPENDENT_AMBULATORY_CARE_PROVIDER_SITE_OTHER): Payer: Medicare Other | Admitting: Family Medicine

## 2011-12-04 DIAGNOSIS — M25562 Pain in left knee: Secondary | ICD-10-CM

## 2011-12-04 DIAGNOSIS — M25569 Pain in unspecified knee: Secondary | ICD-10-CM

## 2011-12-04 DIAGNOSIS — M109 Gout, unspecified: Secondary | ICD-10-CM

## 2011-12-04 MED ORDER — ALLOPURINOL 100 MG PO TABS: 100.0000 mg | ORAL_TABLET | Freq: Every day | ORAL | Status: DC

## 2011-12-04 NOTE — Progress Notes (Signed)
[facility]:  Office Visit  Chief Complaint:  Chief Complaint  Patient presents with  . Bronchitis    chronic    HPI: Deborah [name] is a 66 y.o. female who complains of : 1. Left gout recheck on 1st great toe-was on Prednisone, much better, no longer acute phase 2. Left pain-x 6 years. occ pop and click, no buickling. No numbness. + h/o peripheral neuropathy. Was seen by ortho ( Dr. Janetta Hora)  prior to this and was given steroid injections. Wants to see a specialist.   Past Medical History  Diagnosis Date  . Bipolar disorder     [name], NP  . Depression   . Hyperlipidemia   . Hypertension   . Hiatal hernia   . Esophageal reflux   . Other specified disorders of liver   . Headache   . Hepatomegaly   . Other chronic nonalcoholic liver disease   . Cardiomegaly   . Irritable bowel syndrome   . Diverticulosis of colon (without mention of hemorrhage)   . COPD (chronic obstructive pulmonary disease)   . Nausea    Past Surgical History  Procedure Date  . Cholecystectomy   . Abdominal hysterectomy     partial  . Ovarian cyst removal   . Removal of cyst on chest wall   . Appendectomy    History   Social History  . Marital Status: Widowed    Spouse Name: N/A    Number of Children: 5  . Years of Education: N/A   Occupational History  . Retired     Administrator, sports   Social History Main Topics  . Smoking status: Never Smoker   . Smokeless tobacco: Never Used  . Alcohol Use: No  . Drug Use: None  . Sexually Active: None   Other Topics Concern  . None   Social History Narrative   Very little caffeine but will drink it    Family History  Problem Relation Age of Onset  . Emphysema Mother   . Heart disease Father   . Colon cancer Maternal Grandfather   . Alzheimer's disease Brother    Allergies  Allergen Reactions  . Bentyl   . Dicyclomine Hcl     REACTION: anaphylactic reaction  . Omeprazole     REACTION: GI upset.  . Statins  Other (See Comments)    myalgias   Prior to Admission medications   Medication Sig Start Date End Date Taking? Authorizing Provider  azelastine (ASTELIN) 137 MCG/SPRAY nasal spray Place 2 sprays into the nose 2 (two) times daily. Use in each nostril as directed   Yes Historical Provider, MD  beclomethasone (QVAR) 40 MCG/ACT inhaler Inhale 2 puffs into the lungs 2 (two) times daily. 02/05/11 02/05/12 Yes Agapito Games, MD  buPROPion (WELLBUTRIN SR) 150 MG 12 hr tablet Take 150 mg by mouth daily.     Yes Historical Provider, MD  clonazePAM (KLONOPIN) 1 MG tablet Take 1 mg by mouth 2 (two) times daily as needed.    Yes Historical Provider, MD  DULoxetine (CYMBALTA) 60 MG capsule Take 60 mg by mouth daily.   Yes Historical Provider, MD  EPINEPHrine (EPIPEN 2-PAK) 0.3 mg/0.3 mL DEVI Inject 0.3 mLs (0.3 mg total) into the muscle as needed. For allergic reaction to beesting 07/05/11  Yes Agapito Games, MD  fish oil-omega-3 fatty acids 1000 MG capsule Take 1 g by mouth daily.   Yes Historical Provider, MD  fluticasone (FLONASE) 50 MCG/ACT nasal  spray Place 2 sprays into the nose daily.   Yes Historical Provider, MD  ipratropium-albuterol (DUONEB) 0.5-2.5 (3) MG/3ML SOLN Take 3 mLs by nebulization every 4 (four) hours as needed. 07/05/11  Yes Agapito Games, MD  metoprolol (TOPROL-XL) 100 MG 24 hr tablet Take 1 tablet (100 mg total) by mouth daily. 07/05/11  Yes Agapito Games, MD  sodium chloride 1 G tablet Take 1 g by mouth daily.   Yes Historical Provider, MD  amoxicillin (AMOXIL) 875 MG tablet Take 1 tablet (875 mg total) by mouth 2 (two) times daily. 11/24/11 12/04/11  Lamar Laundry, MD  HYDROcodone-homatropine [facility]) 5-1.5 MG/5ML syrup Take 5 mLs by mouth every 8 (eight) hours as needed for cough. 11/24/11 12/04/11  Lamar Laundry, MD  NON FORMULARY Patient is on 2 liters of oxygen at night    Historical Provider, MD  predniSONE (DELTASONE) 10 MG tablet Take 40mg  x2d, 30mg x2d,  20mg x2d, 10mg x2d po pc for gout 11/10/11   Jonita Albee, MD     ROS: The patient denies fevers, chills, night sweats, unintentional weight loss, chest pain, palpitations, wheezing, dyspnea on exertion, nausea, vomiting, abdominal pain, dysuria, hematuria, melena, numbness, weakness, or tingling. +knee pain, gout  All other systems have been reviewed and were otherwise negative with the exception of those mentioned in the HPI and as above.    PHYSICAL EXAM: Filed Vitals:   12/04/11 1411  BP: 118/75  Pulse: 78  Temp: 97.8 F (36.6 C)  Resp: 18   Filed Vitals:   12/04/11 1411  Height: 5\' 3"  (1.6 m)  Weight: 234 lb 9.6 oz (106.414 kg)   Body mass index is 41.56 kg/(m^2).  General: Alert, no acute distress, obese HEENT:  Normocephalic, atraumatic, oropharynx patent. EOMI, PERRLA, tm nl  Cardiovascular:  Regular rate and rhythm, no rubs murmurs or gallops.  No Carotid bruits, radial pulse intact. No pedal edema.  Respiratory: Clear to auscultation bilaterally.  No wheezes, rales, or rhonchi.  No cyanosis, no use of accessory musculature GI: No organomegaly, abdomen is soft and non-tender, positive bowel sounds.  No masses. Skin: No rashes. Neurologic: Facial musculature symmetric. Psychiatric: Patient has flight of ideas and very tangential.  Lymphatic: No cervical lymphadenopathy Musculoskeletal: Gait intact. Foot Exam: neurovascularly intact, + left  Great toe erythema without warmth or swelling. Full ROM + Dorsalis pedis   LABS: Results for orders placed in visit on 11/10/11  POCT CBC      Component Value Range   WBC 6.9  4.6 - 10.2 (K/uL)   Lymph, poc 2.4  0.6 - 3.4    POC LYMPH PERCENT 34.6  10 - 50 (%L)   MID (cbc) 0.5  0 - 0.9    POC MID % 7.9  0 - 12 (%M)   POC Granulocyte 4.0  2 - 6.9    Granulocyte percent 57.5  37 - 80 (%G)   RBC 4.54  4.04 - 5.48 (M/uL)   Hemoglobin 13.0  12.2 - 16.2 (g/dL)   HCT, POC 40.9  81.1 - 47.9 (%)   MCV 90.0  80 - 97 (fL)   MCH, POC  28.6  27 - 31.2 (pg)   MCHC 31.8  31.8 - 35.4 (g/dL)   RDW, POC 91.4     Platelet Count, POC 319  142 - 424 (K/uL)   MPV 8.2  0 - 99.8 (fL)  POCT SEDIMENTATION RATE      Component Value Range   POCT SED RATE 32 (*)  0 - 22 (mm/hr)  COMPREHENSIVE METABOLIC PANEL      Component Value Range   Sodium 137  135 - 145 (mEq/L)   Potassium 4.1  3.5 - 5.3 (mEq/L)   Chloride 100  96 - 112 (mEq/L)   CO2 27  19 - 32 (mEq/L)   Glucose, Bld 101 (*) 70 - 99 (mg/dL)   BUN 14  6 - 23 (mg/dL)   Creat 1.47  8.29 - 5.62 (mg/dL)   Total Bilirubin 0.4  0.3 - 1.2 (mg/dL)   Alkaline Phosphatase 51  39 - 117 (U/L)   AST 34  0 - 37 (U/L)   ALT 35  0 - 35 (U/L)   Total Protein 6.8  6.0 - 8.3 (g/dL)   Albumin 4.1  3.5 - 5.2 (g/dL)   Calcium 9.6  8.4 - 13.0 (mg/dL)  URIC ACID      Component Value Range   Uric Acid, Serum 7.4 (*) 2.4 - 7.0 (mg/dL)     EKG/XRAY:   Primary read interpreted by Dr. Conley Rolls at [facility].   ASSESSMENT/PLAN: Encounter Diagnoses  Name Primary?  . Gout Yes  . Left knee pain    F/u on gout-she is doing better. Does not appear to be in an acute flare up so will start her on allopurinol. Advise to f/yu with Dr. Milus Glazier or Audria Nine. Rx Allopurinol 100 mg daily Patient requested referral to ortho for knee pain-gave her info for [facility] ortho, she will look into it herself.     Hamilton Capri PHUONG, DO 12/04/2011 3:33 PM

## 2011-12-20 ENCOUNTER — Encounter: Payer: Self-pay | Admitting: Family Medicine

## 2011-12-20 ENCOUNTER — Ambulatory Visit (INDEPENDENT_AMBULATORY_CARE_PROVIDER_SITE_OTHER): Payer: Medicare Other | Admitting: Family Medicine

## 2011-12-20 DIAGNOSIS — J449 Chronic obstructive pulmonary disease, unspecified: Secondary | ICD-10-CM

## 2011-12-20 DIAGNOSIS — Z5189 Encounter for other specified aftercare: Secondary | ICD-10-CM

## 2011-12-20 DIAGNOSIS — Z79899 Other long term (current) drug therapy: Secondary | ICD-10-CM

## 2011-12-20 DIAGNOSIS — M25569 Pain in unspecified knee: Secondary | ICD-10-CM

## 2011-12-20 DIAGNOSIS — G8929 Other chronic pain: Secondary | ICD-10-CM

## 2011-12-20 DIAGNOSIS — M109 Gout, unspecified: Secondary | ICD-10-CM

## 2011-12-20 MED ORDER — AZELASTINE HCL 0.1 % NA SOLN: 2.0000 | Freq: Two times a day (BID) | NASAL | Status: DC

## 2011-12-20 MED ORDER — FLUTICASONE PROPIONATE 50 MCG/ACT NA SUSP: 2.0000 | Freq: Every day | NASAL | Status: DC

## 2011-12-20 MED ORDER — ALLOPURINOL 100 MG PO TABS: 100.0000 mg | ORAL_TABLET | Freq: Every day | ORAL | Status: DC

## 2011-12-20 NOTE — Progress Notes (Signed)
Subjective:    Patient ID: Deborah [name], female    DOB: [birth year], 66 y.o.   MRN: [id number]  HPI  This 66 y. o. Cauc female is here for follow-up re: gout; however, she has several other complaints   that she would like to address. She was seen on 11/24/11/ at [address] [facility] [address] Dr. Solmon Ice for gout, cough and fatigue.  She completed the prescribed meds but returned to 66 [facility] on 12/04/11/to be seen by Dr. Colen Darling again for  gout, knee pain and bronchitis. Labs from March were reviewed (all normal) except uric acid=7.4 and  ESR= 32. Pt requested Ortho referral and was given info pertaining to [facility] with the  understanding that she was going to follow-up on that referral on her own. She needs RF for Allopurinol.         She still is having cough, post- nasal drip with sore throat; she has chronic allergies and has 2 nasal sprays  for use; she needs RFs. She also needs RF for Ventolin MDI specifically; she states that this one works better than   Proventil MDI. She denies any nocturnal symptoms that may suggest uncontrolled Asthma. She uses Rescue MDI  a few times a week.   Review of Systems  Constitutional: Positive for fatigue. Negative for fever, chills and appetite change.  HENT: Positive for congestion, sore throat, rhinorrhea, sneezing and postnasal drip. Negative for ear pain, nosebleeds, trouble swallowing, voice change and sinus pressure.   Eyes: Positive for itching. Negative for discharge and visual disturbance.  Respiratory: Negative for chest tightness and wheezing.   Cardiovascular: Positive for chest pain.  Musculoskeletal: Positive for joint swelling and arthralgias. Negative for back pain.  Neurological: Negative for dizziness, light-headedness and headaches.  Psychiatric/Behavioral: Positive for sleep disturbance and decreased concentration.       Objective:   Physical Exam  Nursing note and vitals reviewed. Constitutional: She is oriented to person, place, and  time. She appears well-developed and well-nourished. No distress.  HENT:  Head: Normocephalic and atraumatic.  Right Ear: External ear normal.  Left Ear: External ear normal.  Mouth/Throat: No oropharyngeal exudate.       Post pharynx erythematous with mild cobblestoning; Nasal mucosa red, not boggy  Eyes: Conjunctivae and EOM are normal. No scleral icterus.  Neck: Normal range of motion. Neck supple. No thyromegaly present.  Cardiovascular: Normal rate.   Pulmonary/Chest: Effort normal. No respiratory distress.  Musculoskeletal:       Right knee: She exhibits decreased range of motion and swelling. She exhibits no effusion, no ecchymosis, no deformity, no erythema and no LCL laxity. tenderness found.       Left foot: She exhibits tenderness and swelling.       Residual swelling and tenderness noted at MTP joint of left big toe.  Lymphadenopathy:    She has no cervical adenopathy.  Neurological: She is alert and oriented to person, place, and time. No cranial nerve deficit.  Psychiatric: Thought content normal. Her affect is blunt. Her speech is tangential. She is slowed. Cognition and memory are impaired. She exhibits a depressed mood.       Pt seems to have difficulty with concentrating and discussion re: Ortho referral was circular- After simple/brief explanation of how referral process works, the pt could not verbalize understanding and we arrived at a plan of action re: referral after repetitive discussion. At times, she would lose her train of thought and seems confused (stating that she was  having trouble thinking straight) She is inattentive.          Assessment & Plan:   1. Gout  RF: allopurinol (ZYLOPRIM) 100 MG tablet  2. Chronic pain of left knee  Ambulatory referral to Orthopedic Surgery  3. Asthma with COPD  RFs: Ventolin -this was faxed to [facility] RF: Azelastine NS and Fluticasone NS  4. Medication management  Reviewed all medications and calrified which ones are to  be refilled by PCP

## 2011-12-20 NOTE — Patient Instructions (Signed)
Gout Gout is an inflammatory condition (arthritis) caused by a buildup of uric acid crystals in the joints. Uric acid is a chemical that is normally present in the blood. Under some circumstances, uric acid can form into crystals in your joints. This causes joint redness, soreness, and swelling (inflammation). Repeat attacks are common. Over time, uric acid crystals can form into masses (tophi) near a joint, causing disfigurement. Gout is treatable and often preventable. CAUSES  The disease begins with elevated levels of uric acid in the blood. Uric acid is produced by your body when it breaks down a naturally found substance called purines. This also happens when you eat certain foods such as meats and fish. Causes of an elevated uric acid level include:  Being passed down from parent to child (heredity).   Diseases that cause increased uric acid production (obesity, psoriasis, some cancers).   Excessive alcohol use.   Diet, especially diets rich in meat and seafood.   Medicines, including certain cancer-fighting drugs (chemotherapy), diuretics, and aspirin.   Chronic kidney disease. The kidneys are no longer able to remove uric acid well.   Problems with metabolism.  Conditions strongly associated with gout include:  Obesity.   High blood pressure.   High cholesterol.   Diabetes.  Not everyone with elevated uric acid levels gets gout. It is not understood why some people get gout and others do not. Surgery, joint injury, and eating too much of certain foods are some of the factors that can lead to gout. SYMPTOMS   An attack of gout comes on quickly. It causes intense pain with redness, swelling, and warmth in a joint.   Fever can occur.   Often, only one joint is involved. Certain joints are more commonly involved:   Base of the big toe.   Knee.   Ankle.   Wrist.   Finger.  Without treatment, an attack usually goes away in a few days to weeks. Between attacks, you  usually will not have symptoms, which is different from many other forms of arthritis. DIAGNOSIS  Your caregiver will suspect gout based on your symptoms and exam. Removal of fluid from the joint (arthrocentesis) is done to check for uric acid crystals. Your caregiver will give you a medicine that numbs the area (local anesthetic) and use a needle to remove joint fluid for exam. Gout is confirmed when uric acid crystals are seen in joint fluid, using a special microscope. Sometimes, blood, urine, and X-ray tests are also used. TREATMENT  There are 2 phases to gout treatment: treating the sudden onset (acute) attack and preventing attacks (prophylaxis). Treatment of an Acute Attack  Medicines are used. These include anti-inflammatory medicines or steroid medicines.   An injection of steroid medicine into the affected joint is sometimes necessary.   The painful joint is rested. Movement can worsen the arthritis.   You may use warm or cold treatments on painful joints, depending which works best for you.   Discuss the use of coffee, vitamin C, or cherries with your caregiver. These may be helpful treatment options.  Treatment to Prevent Attacks After the acute attack subsides, your caregiver may advise prophylactic medicine. These medicines either help your kidneys eliminate uric acid from your body or decrease your uric acid production. You may need to stay on these medicines for a very long time. The early phase of treatment with prophylactic medicine can be associated with an increase in acute gout attacks. For this reason, during the first few months   of treatment, your caregiver may also advise you to take medicines usually used for acute gout treatment. Be sure you understand your caregiver's directions. You should also discuss dietary treatment with your caregiver. Certain foods such as meats and fish can increase uric acid levels. Other foods such as dairy can decrease levels. Your caregiver  can give you a list of foods to avoid. HOME CARE INSTRUCTIONS   Do not take aspirin to relieve pain. This raises uric acid levels.   Only take over-the-counter or prescription medicines for pain, discomfort, or fever as directed by your caregiver.   Rest the joint as much as possible. When in bed, keep sheets and blankets off painful areas.   Keep the affected joint raised (elevated).   Use crutches if the painful joint is in your leg.   Drink enough water and fluids to keep your urine clear or pale yellow. This helps your body get rid of uric acid. Do not drink alcoholic beverages. They slow the passage of uric acid.   Follow your caregiver's dietary instructions. Pay careful attention to the amount of protein you eat. Your daily diet should emphasize fruits, vegetables, whole grains, and fat-free or low-fat milk products.   Maintain a healthy body weight.  SEEK MEDICAL CARE IF:   You have an oral temperature above 102 F (38.9 C).   You develop diarrhea, vomiting, or any side effects from medicines.   You do not feel better in 24 hours, or you are getting worse.  SEEK IMMEDIATE MEDICAL CARE IF:   Your joint becomes suddenly more tender and you have:   Chills.   An oral temperature above 102 F (38.9 C), not controlled by medicine.  MAKE SURE YOU:   Understand these instructions.   Will watch your condition.   Will get help right away if you are not doing well or get worse.  Document Released: 08/16/2000 Document Revised: 08/08/2011 Document Reviewed: 11/27/2009 [employer] Patient Information 2012 [employer]. 

## 2011-12-25 ENCOUNTER — Encounter: Payer: Self-pay | Admitting: Family Medicine

## 2012-02-23 ENCOUNTER — Ambulatory Visit (INDEPENDENT_AMBULATORY_CARE_PROVIDER_SITE_OTHER): Payer: Medicare Other | Admitting: Family Medicine

## 2012-02-23 VITALS — BP 113/74 | HR 80 | Temp 98.7°F | Resp 16 | Ht 63.0 in | Wt 237.2 lb

## 2012-02-23 DIAGNOSIS — J329 Chronic sinusitis, unspecified: Secondary | ICD-10-CM

## 2012-02-23 DIAGNOSIS — J019 Acute sinusitis, unspecified: Secondary | ICD-10-CM

## 2012-02-23 DIAGNOSIS — M25569 Pain in unspecified knee: Secondary | ICD-10-CM

## 2012-02-23 MED ORDER — LEVOFLOXACIN 500 MG PO TABS: 500.0000 mg | ORAL_TABLET | Freq: Every day | ORAL | Status: AC

## 2012-02-23 MED ORDER — METHYLPREDNISOLONE ACETATE 80 MG/ML IJ SUSP
80.0000 mg | Freq: Once | INTRAMUSCULAR | Status: AC
  Administered 2012-02-23: 80 mg via INTRAMUSCULAR

## 2012-02-23 NOTE — Progress Notes (Signed)
66 yo widowed woman with chronic sinus problems, worse lately.  Notes some ear stuffiness.  No fever, epistaxis or neck stiffness.  O: NAD HEENT:  Horrible dentition, normal TM's, mucopurulent nasal discharge. Skin:  Picker's nodules on forearms.  A:  Sinusitis, picker's nodules (nummular eczema)  P:  1. Sinusitis  levofloxacin (LEVAQUIN) 500 MG tablet    S:  Complains of knee pains.  Did well with knee injections by Dr Dena Billet who has retired.  O: waddling type gait  A:  Knee arthritis  P:  Refer to orthopedics.

## 2012-03-11 ENCOUNTER — Ambulatory Visit (INDEPENDENT_AMBULATORY_CARE_PROVIDER_SITE_OTHER): Payer: Medicare Other | Admitting: Family Medicine

## 2012-03-11 ENCOUNTER — Encounter: Payer: Self-pay | Admitting: Family Medicine

## 2012-03-11 ENCOUNTER — Telehealth: Payer: Self-pay | Admitting: *Deleted

## 2012-03-11 VITALS — BP 160/83 | HR 75 | Wt 238.0 lb

## 2012-03-11 DIAGNOSIS — R799 Abnormal finding of blood chemistry, unspecified: Secondary | ICD-10-CM

## 2012-03-11 DIAGNOSIS — I10 Essential (primary) hypertension: Secondary | ICD-10-CM

## 2012-03-11 DIAGNOSIS — R7989 Other specified abnormal findings of blood chemistry: Secondary | ICD-10-CM

## 2012-03-11 DIAGNOSIS — Z780 Asymptomatic menopausal state: Secondary | ICD-10-CM

## 2012-03-11 DIAGNOSIS — E559 Vitamin D deficiency, unspecified: Secondary | ICD-10-CM

## 2012-03-11 DIAGNOSIS — Z1231 Encounter for screening mammogram for malignant neoplasm of breast: Secondary | ICD-10-CM

## 2012-03-11 DIAGNOSIS — E785 Hyperlipidemia, unspecified: Secondary | ICD-10-CM

## 2012-03-11 DIAGNOSIS — R7309 Other abnormal glucose: Secondary | ICD-10-CM

## 2012-03-11 MED ORDER — LISINOPRIL 20 MG PO TABS: 20.0000 mg | ORAL_TABLET | Freq: Every day | ORAL | Status: DC

## 2012-03-11 NOTE — Progress Notes (Signed)
  Subjective:    Patient ID: Deborah [name], female    DOB: 1946/04/19, 66 y.o.   MRN: [id number]  HPI Here to f/u on labs. Brought in old labs.  CHol was elevated. choride is low.  Vit D was low. One liver enzyme was elevated.   Uric acid was high as well. She says she still feels tired but this is also chronic complaint. Has not necessarily gotten worse recently.. She is still seeing [name] for psychiatry. She's also been having some problems with her knees recently and has made an appointment with a rheumatologist. She also now lives in [place] has been trying to find a new PCP there. Unfortunately she's running into a situation where these practices are not taking any new Medicare patients.  HTN - Home BP have been running in the 130. Says when sees psych BP have been well controlled.  No chest pain or shortness of breath. Normally she is very consistent with her medications.   Review of Systems     Objective:   Physical Exam  Constitutional: She is oriented to person, place, and time. She appears well-developed and well-nourished.  HENT:  Head: Normocephalic and atraumatic.  Cardiovascular: Normal rate, regular rhythm and normal heart sounds.        2/6SEM  Pulmonary/Chest: Effort normal and breath sounds normal.  Neurological: She is alert and oriented to person, place, and time.  Skin: Skin is warm and dry.  Psychiatric: She has a normal mood and affect. Her behavior is normal.          Assessment & Plan:  Vitamin D def- Will recheck level.    Abnormal liver enzymes - Due to recheck.   HTN- not well controlled today but didn't take her pills.  Orig I was going to add lisinopril but she really feels BP have been well under 140 at other providers offices. Will call to cancel the rx.   hyperlpidemia- Due to recheck.  She is not on a statin.   Gout - Recheck uric acid level to make sure at goal. No recent flares that she is having more problems with her leak knees  lately. She was told at one time that she would eventually need knee replacement.

## 2012-03-11 NOTE — Telephone Encounter (Signed)
Patient wants to add a thyroid test to her blood work. Is this ok and what dx?

## 2012-03-11 NOTE — Telephone Encounter (Signed)
Printed lab slip. Can fax down

## 2012-03-12 LAB — COMPLETE METABOLIC PANEL WITH GFR
ALT: 45 U/L — ABNORMAL HIGH (ref 0–35)
AST: 32 U/L (ref 0–37)
Alkaline Phosphatase: 53 U/L (ref 39–117)
CO2: 32 mEq/L (ref 19–32)
Creat: 0.69 mg/dL (ref 0.50–1.10)
GFR, Est African American: >89 mL/min
Sodium: 137 mEq/L (ref 135–145)
Total Bilirubin: 0.5 mg/dL (ref 0.3–1.2)
Total Protein: 7.3 g/dL (ref 6.0–8.3)

## 2012-03-12 LAB — LIPID PANEL
Cholesterol: 349 mg/dL — ABNORMAL HIGH (ref 0–200)
HDL: 63 mg/dL (ref >39)
LDL Cholesterol: 234 mg/dL — ABNORMAL HIGH (ref 0–99)
Total CHOL/HDL Ratio: 5.5 Ratio
Triglycerides: 258 mg/dL — ABNORMAL HIGH (ref <150)
VLDL: 52 mg/dL — ABNORMAL HIGH (ref 0–40)

## 2012-03-12 LAB — TSH: TSH: 2.93 u[IU]/mL (ref 0.350–4.500)

## 2012-03-12 NOTE — Telephone Encounter (Signed)
Quick Note:  All labs are normal. ______ 

## 2012-03-17 ENCOUNTER — Encounter: Payer: Self-pay | Admitting: Family Medicine

## 2012-03-27 ENCOUNTER — Other Ambulatory Visit: Payer: Self-pay | Admitting: Family Medicine

## 2012-03-27 DIAGNOSIS — R5382 Chronic fatigue, unspecified: Secondary | ICD-10-CM

## 2012-04-01 ENCOUNTER — Ambulatory Visit
Admission: RE | Admit: 2012-04-01 | Discharge: 2012-04-01 | Disposition: A | Discharge status: Home / Self Care | Payer: Medicare Other | Source: Ambulatory Visit | Attending: Family Medicine | Admitting: Family Medicine

## 2012-04-01 DIAGNOSIS — Z1231 Encounter for screening mammogram for malignant neoplasm of breast: Secondary | ICD-10-CM

## 2012-04-01 DIAGNOSIS — Z780 Asymptomatic menopausal state: Secondary | ICD-10-CM

## 2012-04-07 ENCOUNTER — Telehealth: Payer: Self-pay | Admitting: *Deleted

## 2012-04-07 DIAGNOSIS — R110 Nausea: Secondary | ICD-10-CM

## 2012-04-07 NOTE — Telephone Encounter (Signed)
Pt called stating that she went to [facility] ED and they mentioned to her that she could be still having nausea even though her gallbladder is out. They mentioned to her a test that could be done to see if that is why she is having nausea. She is having them to fax over the notes from the ED.

## 2012-04-08 NOTE — Telephone Encounter (Signed)
We will have to refer her to GI for that.  Which GI does she want to see?

## 2012-04-09 NOTE — Telephone Encounter (Signed)
Pt states that she would like to be referred to [facility]. States that she has been seen there before. States she would like for you to call her to let her know about what test they will do. She is scheduled to see you tomorrow for the same thing we are referring out for. Please advise.

## 2012-04-09 NOTE — Telephone Encounter (Signed)
Pt informed

## 2012-04-09 NOTE — Telephone Encounter (Signed)
Ok we can discuss at f/u tomorrow.

## 2012-04-10 ENCOUNTER — Ambulatory Visit (INDEPENDENT_AMBULATORY_CARE_PROVIDER_SITE_OTHER): Payer: Medicare Other | Admitting: Family Medicine

## 2012-04-10 ENCOUNTER — Encounter: Payer: Self-pay | Admitting: Family Medicine

## 2012-04-10 VITALS — BP 146/78 | HR 73 | Wt 240.0 lb

## 2012-04-10 DIAGNOSIS — R1011 Right upper quadrant pain: Secondary | ICD-10-CM

## 2012-04-10 DIAGNOSIS — R110 Nausea: Secondary | ICD-10-CM

## 2012-04-10 NOTE — Progress Notes (Signed)
  Subjective:    Patient ID: Deborah [name], female    DOB: 21-May-1946, 66 y.o.   MRN: [id number]  HPI Deborah [name] ED for nausea and pain in her stomach. Had a lot of diarrhea that day but that is  Not normal for her.  Pain was mostly in the RUQ.  Nausea intermittantly for couple  Year. Say always feels nauseated. Usually loses peptobismo.  Hx of cholecystectomy  Has used some PPIs in the past.  Usually with her nausea not abdominal pain or vomiting.  Occ hearburn sxs.  Had a normal endoscopy sometime this year.    Review of Systems     Objective:   Physical Exam  Constitutional: She is oriented to person, place, and time. She appears well-developed and well-nourished.  HENT:  Head: Normocephalic and atraumatic.  Cardiovascular: Normal rate, regular rhythm and normal heart sounds.   Pulmonary/Chest: Effort normal and breath sounds normal.  Abdominal: Soft. Bowel sounds are normal. She exhibits no distension and no mass. There is tenderness. There is no rebound and no guarding.       RUQ pain and tenderness  Neurological: She is alert and oriented to person, place, and time.  Skin: Skin is warm and dry.  Psychiatric: She has a normal mood and affect. Her behavior is normal.          Assessment & Plan:  Nausea - CBD stone is less likely without any pain.  Pancreatitis is also less likely since has been present for 2 years. . Episode a couple of days ago has resolved and may have been viral.  Consider starting a PPI again.  She says she's not really extending this at this time. No hx of GI ulcers with normal endoscopy this year.  Also consider may be central nausea.  Also consider lactose intolerance, or gluten sensivity. We will do blood work for both of these are explained her that is not a very sensitive test. If it screens negative and she still could consider trying a lactose-free or gluten free diet and see if this helps. 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 Bowel syndrome is also possible that she doesn't seem to have a lot of changes with her bowel movements. Chronic low-grade pancreatitis is certainly an option though would like to check an amylase and lipase today. Also consider going back up with her gastroenterologist who it sounds like she has a good relationship with his RE seen once this year. Also consider MRCP for further evaluation.

## 2012-04-10 NOTE — Patient Instructions (Addendum)
We will call you with your lab results. If you don't here from us in about a week then please give us a call at [phone].  

## 2012-04-11 LAB — LIPASE: Lipase: 24 U/L (ref 0–75)

## 2012-04-13 LAB — GLIA (IGA/G) + TTG IGA
Gliadin IgA: 2.8 U/mL (ref <20)
Gliadin IgG: 7.2 U/mL (ref <20)

## 2012-04-14 ENCOUNTER — Telehealth: Payer: Self-pay | Admitting: Gastroenterology

## 2012-04-14 NOTE — Telephone Encounter (Signed)
Pt called to ask if she can be tested for gall bladder symptoms- nausea, abdominal pain- post cholecystectomy; biliary dyskinesia? She has spoken to several doctors and radiologists who think she may have this problem. Please advise. Pt saw her PCP yesterday and her note is in EPIC. Thanks.

## 2012-04-14 NOTE — Telephone Encounter (Signed)
Ov will do

## 2012-04-14 NOTE — Telephone Encounter (Signed)
Left a message for patient to call

## 2012-04-15 LAB — ALLERGEN MILK: Milk IgE: <0.1 kU/L

## 2012-04-15 NOTE — Telephone Encounter (Signed)
Spoke with pt and she already has an appt with Dr Jarold Motto for 04/23/12 at 0945am; pt stated understanding.

## 2012-04-15 NOTE — Telephone Encounter (Signed)
lmom for pt to call back

## 2012-04-16 ENCOUNTER — Encounter: Payer: Self-pay | Admitting: *Deleted

## 2012-04-23 ENCOUNTER — Ambulatory Visit: Payer: Medicare Other | Admitting: Gastroenterology

## 2012-04-28 ENCOUNTER — Ambulatory Visit: Payer: Medicare Other | Admitting: Gastroenterology

## 2012-04-28 ENCOUNTER — Encounter: Payer: Self-pay | Admitting: Family Medicine

## 2012-04-30 ENCOUNTER — Telehealth: Payer: Self-pay | Admitting: Gastroenterology

## 2012-04-30 NOTE — Telephone Encounter (Signed)
Pt has been nauseated for >2.5 years. Friends have told her she may have a dysfunctional GB duct. She will come in on 05/12/12. I will call her prior to her appt and remind her and she is to check on a ride.

## 2012-05-12 ENCOUNTER — Ambulatory Visit (INDEPENDENT_AMBULATORY_CARE_PROVIDER_SITE_OTHER): Payer: Medicare Other | Admitting: Gastroenterology

## 2012-05-12 ENCOUNTER — Encounter: Payer: Self-pay | Admitting: Gastroenterology

## 2012-05-12 VITALS — BP 140/90 | HR 88 | Ht 63.0 in | Wt 244.8 lb

## 2012-05-12 DIAGNOSIS — K7689 Other specified diseases of liver: Secondary | ICD-10-CM

## 2012-05-12 DIAGNOSIS — E669 Obesity, unspecified: Secondary | ICD-10-CM

## 2012-05-12 DIAGNOSIS — K760 Fatty (change of) liver, not elsewhere classified: Secondary | ICD-10-CM

## 2012-05-12 DIAGNOSIS — Z8719 Personal history of other diseases of the digestive system: Secondary | ICD-10-CM

## 2012-05-12 DIAGNOSIS — R110 Nausea: Secondary | ICD-10-CM

## 2012-05-12 DIAGNOSIS — F319 Bipolar disorder, unspecified: Secondary | ICD-10-CM

## 2012-05-12 MED ORDER — DEXLANSOPRAZOLE 60 MG PO CPDR: DELAYED_RELEASE_CAPSULE | ORAL | Status: DC

## 2012-05-12 NOTE — Progress Notes (Signed)
This is a 66 year old female with chronic bipolar anxiety and depression on multiple medications. She has chronic nausea and has been worked up by several gastroenterologists which have included endoscopy, colonoscopy, gastric emptying scans, CT scans and ultrasounds. She has continued right upper quadrant pain secondary to fatty infiltration of her liver. Previous endoscopy showed a large hiatal hernia and erosive esophagitis, and she is refused PPI therapy. The patient denies typical reflux symptoms, dysphagia, lower gastrointestinal problems, melena or hematochezia. Patient also denies a specific food intolerances. She is accompanied today by her daughter throughout the interview and exam. The patient relates that her chronic psychiatric problems are under good control. Review of her chart, records, and radiographs was very time consuming and repetitive.   Current Medications, Allergies, Past Medical History, Past Surgical History, Family History and Social History were reviewed in [employer] record.  Pertinent Review of Systems Negative   Physical Exam: Obese patient in no acute distress. Blood pressure 140/90, pulse 88 and regular, and weight 244 pounds with a BMI of 43.36. I cannot appreciate stigmata of chronic liver disease. Chest very obese abdomen without definite organomegaly, masses or tenderness. Bowel sounds are nonobstructive in nature. Rectal exam is deferred. Mental status was normal but the patient is very confused in terms of any reliable historial events her previous therapies.   Assessment and Plan: No diagnosis found. this patient has obesity and fatty liver with chronic right upper quadrant discomfort. I suspect her nausea is related to her multiple medications, fatty liver, and perhaps acid reflux. She is status post cholecystectomy with no evidence of biliary obstruction or pancreatitis. I do not think she needs HICA scan, or repeat radiographic procedures  or endoscopic procedures at this time. I have placed her on a trial of Dexilant 60 mg a day 30 minutes before breakfast with standard and diarrhea flux maneuvers. This patient continues to have problems, I would suggest referral to a tertiary Medical Center.

## 2012-05-12 NOTE — Patient Instructions (Addendum)
We have given you samples of the following medication to take: Dexilant. We have sent the following medications to your pharmacy for you to pick up at your convenience: Dexilant  CC: [name], M. D.

## 2012-05-20 ENCOUNTER — Telehealth: Payer: Self-pay | Admitting: Gastroenterology

## 2012-05-21 NOTE — Telephone Encounter (Signed)
lmom for pt to call back

## 2012-05-25 NOTE — Telephone Encounter (Signed)
Spoke with pt who reports she is no better on Dexilant. We discussed the visit with Dr Jarold Motto and per his notes he doesn't feel as though she needs any further testing at this time; she may need a referral to a major medical center. After discussion, pt decided she will have her NP possibly call and talk with Dr Jarold Motto about pt.

## 2012-05-27 ENCOUNTER — Encounter: Payer: Self-pay | Admitting: Family Medicine

## 2012-05-27 ENCOUNTER — Ambulatory Visit (INDEPENDENT_AMBULATORY_CARE_PROVIDER_SITE_OTHER): Payer: Medicare Other | Admitting: Family Medicine

## 2012-05-27 VITALS — BP 143/76 | HR 72 | Wt 246.0 lb

## 2012-05-27 DIAGNOSIS — G4734 Idiopathic sleep related nonobstructive alveolar hypoventilation: Secondary | ICD-10-CM

## 2012-05-27 DIAGNOSIS — I10 Essential (primary) hypertension: Secondary | ICD-10-CM

## 2012-05-27 DIAGNOSIS — Z23 Encounter for immunization: Secondary | ICD-10-CM

## 2012-05-27 MED ORDER — HYDROCHLOROTHIAZIDE 25 MG PO TABS: 25.0000 mg | ORAL_TABLET | Freq: Every day | ORAL | Status: DC

## 2012-05-27 NOTE — Progress Notes (Signed)
  Subjective:    Patient ID: Deborah [name], female    DOB: 08/06/1946, 66 y.o.   MRN: [id number]  HPI Here to f/u on home oxygen. Wears 2 L at night.  Says she is dong well. She is using it every night.  Sleeping  Better.  Occ wakes up with HA in the morning .  Gets her supplies through [facility].  Has gained some weight. Overnight home oximetry performed in October 2012 showing she defatted to 85% for a little over 5 minutes. This does qualify for overnight oxygen. She is currently established with [facility] home [facility].  HTN- says BP high a few times.  Says her ankles have been more swollen lately.  Review of Systems     Objective:   Physical Exam  Constitutional: She is oriented to person, place, and time. She appears well-developed and well-nourished.  HENT:  Head: Normocephalic and atraumatic.  Cardiovascular: Normal rate, regular rhythm and normal heart sounds.   Pulmonary/Chest: Effort normal and breath sounds normal.  Musculoskeletal: She exhibits edema.       Trace ankle edema bilaterally  Neurological: She is alert and oriented to person, place, and time.  Skin: Skin is warm and dry.  Psychiatric: She has a normal mood and affect. Her behavior is normal.          Assessment & Plan:  Hypoxemia, sleep related. - Doing well. Continue overnight oxygen.  Will fax noes to [facility]. She is compliant.   HTN - uncontrolled.  Work on diet and exercise. Has gained some weight in the lat couple o fmonths.  Start hctz. Discussed potential SE. F/U in 1 month.  Will help her ankle swelling as well.

## 2012-06-01 ENCOUNTER — Other Ambulatory Visit: Payer: Self-pay | Admitting: *Deleted

## 2012-06-01 MED ORDER — EPINEPHRINE 0.3 MG/0.3ML IJ DEVI: 0.3000 mg | INTRAMUSCULAR | Status: DC | PRN

## 2012-06-09 ENCOUNTER — Other Ambulatory Visit: Payer: Self-pay | Admitting: Family Medicine

## 2012-06-21 ENCOUNTER — Other Ambulatory Visit: Payer: Self-pay | Admitting: Family Medicine

## 2012-07-16 ENCOUNTER — Other Ambulatory Visit: Payer: Self-pay | Admitting: Family Medicine

## 2012-08-10 ENCOUNTER — Other Ambulatory Visit: Payer: Self-pay | Admitting: Neurology

## 2012-08-10 DIAGNOSIS — F0390 Unspecified dementia without behavioral disturbance: Secondary | ICD-10-CM

## 2012-08-14 ENCOUNTER — Other Ambulatory Visit: Payer: Self-pay | Admitting: Family Medicine

## 2012-08-16 ENCOUNTER — Ambulatory Visit (INDEPENDENT_AMBULATORY_CARE_PROVIDER_SITE_OTHER): Payer: Medicare Other | Admitting: Internal Medicine

## 2012-08-16 VITALS — BP 146/88 | HR 88 | Temp 97.8°F | Resp 16 | Ht 63.0 in | Wt 235.2 lb

## 2012-08-16 DIAGNOSIS — J040 Acute laryngitis: Secondary | ICD-10-CM

## 2012-08-16 DIAGNOSIS — R05 Cough: Secondary | ICD-10-CM

## 2012-08-16 MED ORDER — HYDROCODONE-ACETAMINOPHEN 7.5-325 MG/15ML PO SOLN: 5.0000 mL | Freq: Four times a day (QID) | ORAL | Status: DC | PRN

## 2012-08-16 NOTE — Progress Notes (Signed)
Pt is here today with complaints of hoarseness and fatigue. Pt states that she was seen at [facility] on 07/29/2012. Pt states that she has not followed up with her PCP since the urgent care visit.

## 2012-08-16 NOTE — Patient Instructions (Signed)
Cough, Adult   A cough is a reflex that helps clear your throat and airways. It can help heal the body or may be a reaction to an irritated airway. A cough may only last 2 or 3 weeks (acute) or may last more than 8 weeks (chronic).   CAUSES  Acute cough:   Viral or bacterial infections.  Chronic cough:   Infections.   Allergies.   Asthma.   Post-nasal drip.   Smoking.   Heartburn or acid reflux.   Some medicines.   Chronic lung problems (COPD).   Cancer.  SYMPTOMS    Cough.   Fever.   Chest pain.   Increased breathing rate.   High-pitched whistling sound when breathing (wheezing).   Colored mucus that you cough up (sputum).  TREATMENT    A bacterial cough may be treated with antibiotic medicine.   A viral cough must run its course and will not respond to antibiotics.   Your caregiver may recommend other treatments if you have a chronic cough.  HOME CARE INSTRUCTIONS    Only take over-the-counter or prescription medicines for pain, discomfort, or fever as directed by your caregiver. Use cough suppressants only as directed by your caregiver.   Use a cold steam vaporizer or humidifier in your bedroom or home to help loosen secretions.   Sleep in a semi-upright position if your cough is worse at night.   Rest as needed.   Stop smoking if you smoke.  SEEK IMMEDIATE MEDICAL CARE IF:    You have pus in your sputum.   Your cough starts to worsen.   You cannot control your cough with suppressants and are losing sleep.   You begin coughing up blood.   You have difficulty breathing.   You develop pain which is getting worse or is uncontrolled with medicine.   You have a fever.  MAKE SURE YOU:    Understand these instructions.   Will watch your condition.   Will get help right away if you are not doing well or get worse.  Document Released: 02/15/2011 Document Revised: 11/11/2011 Document Reviewed: 02/15/2011  ExitCare Patient Information 2013 [employer]  Laryngitis  At the top of your  windpipe is your voice box. It is the source of your voice. Inside your voice box are 2 bands of muscles called vocal cords. When you breathe, your vocal cords are relaxed and open so that air can get into the lungs. When you decide to say something, these cords come together and vibrate. The sound from these vibrations goes into your throat and comes out through your mouth as sound.  Laryngitis is an inflammation of the vocal cords that causes hoarseness, cough, loss of voice, sore throat, and dry throat. Laryngitis can be temporary (acute) or long-term (chronic). Most cases of acute laryngitis improve with time.Chronic laryngitis lasts for more than 3 weeks.  CAUSES  Laryngitis can often be related to excessive smoking, talking, or yelling, as well as inhalation of toxic fumes and allergies. Acute laryngitis is usually caused by a viral infection, vocal strain, measles or mumps, or bacterial infections. Chronic laryngitis is usually caused by vocal cord strain, vocal cord injury, postnasal drip, growths on the vocal cords, or acid reflux.  SYMPTOMS    Cough.   Sore throat.   Dry throat.  RISK FACTORS   Respiratory infections.   Exposure to irritating substances, such as cigarette smoke, excessive amounts of alcohol, stomach acids, and workplace chemicals.   Voice trauma,   such as vocal cord injury from shouting or speaking too loud.  DIAGNOSIS   Your cargiver will perform a physical exam. During the physical exam, your caregiver will examine your throat. The most common sign of laryngitis is hoarseness. Laryngoscopy may be necessary to confirm the diagnosis of this condition. This procedure allows your caregiver to look into the larynx.  HOME CARE INSTRUCTIONS   Drink enough fluids to keep your urine clear or pale yellow.   Rest until you no longer have symptoms or as directed by your caregiver.   Breathe in moist air.   Take all medicine as directed by your caregiver.   Do not smoke.   Talk as little  as possible (this includes whispering).   Write on paper instead of talking until your voice is back to normal.   Follow up with your caregiver if your condition has not improved after 10 days.  SEEK MEDICAL CARE IF:    You have trouble breathing.   You cough up blood.   You have persistent fever.   You have increasing pain.   You have difficulty swallowing.  MAKE SURE YOU:   Understand these instructions.   Will watch your condition.   Will get help right away if you are not doing well or get worse.  Document Released: 08/19/2005 Document Revised: 11/11/2011 Document Reviewed: 10/25/2010  Exit[employer] Patient Information 2013 [employer].

## 2012-08-16 NOTE — Progress Notes (Signed)
  Subjective:    Patient ID: Deborah [name], female    DOB: October 23, 1945, 66 y.o.   MRN: [id number]  HPI Hoarseness and cough Had nl cxr at [facility]  Review of Systems     Objective:   Physical Exam  Vitals reviewed. Constitutional: She is oriented to person, place, and time. She appears well-nourished. No distress.  HENT:  Right Ear: External ear normal.  Left Ear: External ear normal.  Nose: Nose normal.  Mouth/Throat: Oropharynx is clear and moist.  Eyes: EOM are normal.  Neck: Normal range of motion. Neck supple. No tracheal deviation present. No thyromegaly present.  Cardiovascular: Normal rate and normal heart sounds.   Pulmonary/Chest: Effort normal and breath sounds normal. No respiratory distress.  Lymphadenopathy:    She has no cervical adenopathy.  Neurological: She is alert and oriented to person, place, and time.  Skin: Skin is warm and dry.  Psychiatric: She has a normal mood and affect.          Assessment & Plan:  Must control cough Lortab/mucinex/humidify

## 2012-08-17 ENCOUNTER — Other Ambulatory Visit: Payer: Medicare Other

## 2012-08-18 ENCOUNTER — Telehealth: Payer: Self-pay | Admitting: Family Medicine

## 2012-08-18 NOTE — Telephone Encounter (Signed)
Call pt: I got report from [facility] and they recommend we consisdr a repeat sleep study but they asked that I order it.  I think she is on  Oxygen at night to recommend overnight pulse ox to make sure oxygen is adequate. If ok then let me know and we can put on home health order.

## 2012-08-19 ENCOUNTER — Telehealth: Payer: Self-pay | Admitting: *Deleted

## 2012-08-20 NOTE — Telephone Encounter (Signed)
Left message to call back  

## 2012-08-21 ENCOUNTER — Telehealth: Payer: Self-pay | Admitting: *Deleted

## 2012-08-21 DIAGNOSIS — R0902 Hypoxemia: Secondary | ICD-10-CM

## 2012-08-21 NOTE — Telephone Encounter (Signed)
She will do the repeated sleep study per your request.

## 2012-08-23 NOTE — Addendum Note (Signed)
Addended by: [name] D on: 08/23/2012 10:15 PM   Modules accepted: Orders

## 2012-08-23 NOTE — Telephone Encounter (Signed)
Order placed

## 2012-09-01 NOTE — Telephone Encounter (Signed)
Left detailed message on vm with dr. Vernie Ammons and to call back if ok with this

## 2012-09-09 ENCOUNTER — Other Ambulatory Visit: Payer: Self-pay | Admitting: Family Medicine

## 2012-10-13 ENCOUNTER — Other Ambulatory Visit: Payer: Self-pay | Admitting: Family Medicine

## 2012-10-23 ENCOUNTER — Telehealth: Payer: Self-pay | Admitting: *Deleted

## 2012-10-23 DIAGNOSIS — R0683 Snoring: Secondary | ICD-10-CM

## 2012-10-23 DIAGNOSIS — G4710 Hypersomnia, unspecified: Secondary | ICD-10-CM

## 2012-10-23 NOTE — Telephone Encounter (Signed)
Patient calls and states that Dr. Sandria Manly suggest she get a sleep study done for sleep apnea. Also she states that Dr. Sandria Manly is leaving the practice and needs a recommendation for another neuro in [place]

## 2012-10-23 NOTE — Telephone Encounter (Signed)
Patient states she has some daytime sleepiness, snores. Wanted to know who you would recommend in that same practice as Dr. Sandria Manly

## 2012-10-23 NOTE — Telephone Encounter (Signed)
Order placed for sleep study. I like Dr. Zipporah Plants, she is one of the female neuro there.

## 2012-10-23 NOTE — Telephone Encounter (Signed)
I can go ahead and place an order for a sleep study. He does ask her some of her symptoms. For example excessive daytime sleepiness, snoring, waking up with headaches-the neck and knees when these for diagnosis code. Please let me know. Also is she willing to see one of his partners?

## 2012-10-25 ENCOUNTER — Other Ambulatory Visit: Payer: Self-pay | Admitting: Family Medicine

## 2012-10-26 NOTE — Telephone Encounter (Signed)
Patient advised.

## 2012-10-27 ENCOUNTER — Other Ambulatory Visit: Payer: Self-pay | Admitting: *Deleted

## 2012-10-27 MED ORDER — METOPROLOL SUCCINATE ER 100 MG PO TB24: ORAL_TABLET | ORAL | Status: DC

## 2012-10-27 MED ORDER — HYDROCHLOROTHIAZIDE 25 MG PO TABS: ORAL_TABLET | ORAL | Status: DC

## 2012-10-28 ENCOUNTER — Other Ambulatory Visit: Payer: Self-pay | Admitting: Family Medicine

## 2012-10-28 DIAGNOSIS — G4710 Hypersomnia, unspecified: Secondary | ICD-10-CM

## 2012-10-28 DIAGNOSIS — R0683 Snoring: Secondary | ICD-10-CM

## 2012-11-08 ENCOUNTER — Encounter (HOSPITAL_BASED_OUTPATIENT_CLINIC_OR_DEPARTMENT_OTHER): Payer: Medicare Other

## 2012-12-21 ENCOUNTER — Encounter: Payer: Self-pay | Admitting: *Deleted

## 2012-12-21 ENCOUNTER — Telehealth: Payer: Self-pay | Admitting: Gastroenterology

## 2012-12-21 ENCOUNTER — Encounter (HOSPITAL_BASED_OUTPATIENT_CLINIC_OR_DEPARTMENT_OTHER): Payer: Self-pay | Admitting: *Deleted

## 2012-12-21 NOTE — Telephone Encounter (Signed)
Left message for patient to call back  

## 2012-12-21 NOTE — Telephone Encounter (Signed)
Pt saw Dr Jarold Motto in September, 2013 and was given Nexium samples, but she never filled the script until this month per [facility]. Pt stated understanding.

## 2012-12-28 ENCOUNTER — Ambulatory Visit (HOSPITAL_BASED_OUTPATIENT_CLINIC_OR_DEPARTMENT_OTHER): Payer: Medicare Other | Attending: Family Medicine | Admitting: Radiology

## 2012-12-28 VITALS — Ht 64.0 in | Wt 236.0 lb

## 2012-12-28 DIAGNOSIS — R259 Unspecified abnormal involuntary movements: Secondary | ICD-10-CM | POA: Insufficient documentation

## 2012-12-28 DIAGNOSIS — G4733 Obstructive sleep apnea (adult) (pediatric): Secondary | ICD-10-CM | POA: Insufficient documentation

## 2013-01-04 ENCOUNTER — Telehealth: Payer: Self-pay | Admitting: *Deleted

## 2013-01-04 NOTE — Telephone Encounter (Signed)
Pt calls and ask if you got her sleep study results and what it showed. Barry Dienes, LPN

## 2013-01-04 NOTE — Telephone Encounter (Signed)
I have not. We may need to call to get the report.

## 2013-01-05 NOTE — Telephone Encounter (Signed)
[facility]ed [name] she had [facility] done and they said she just had done on 28th and hasn't been read but it will show in Epic when it has been read. Barry Dienes, LPN

## 2013-01-07 DIAGNOSIS — G4730 Sleep apnea, unspecified: Secondary | ICD-10-CM

## 2013-01-07 DIAGNOSIS — G4710 Hypersomnia, unspecified: Secondary | ICD-10-CM

## 2013-01-08 ENCOUNTER — Telehealth: Payer: Self-pay | Admitting: Family Medicine

## 2013-01-08 NOTE — Telephone Encounter (Signed)
Call pt. Got sleep study results back.  haver her schedule appt to discuss the results.

## 2013-01-08 NOTE — Telephone Encounter (Signed)
Called pt and lvm  informed her that the results from her sleep study came back and Dr. Linford Arnold would like for her to come in to discuss, told her to call back to schedule an appt .Loralee Pacas [place]

## 2013-01-08 NOTE — Procedures (Signed)
NAMEKRISTILYN, Deborah [name] NO.:  [id number]  MEDICAL RECORD NO.:  [id number]          PATIENT TYPE:  OUT  LOCATION:  SLEEP CENTER                 FACILITY:  [facility]  PHYSICIAN:  Barbaraann Share, MD,FCCPDATE OF BIRTH:  Nov 21, 1945  DATE OF STUDY:  12/28/2012                           NOCTURNAL POLYSOMNOGRAM  REFERRING PHYSICIAN:  [name], M.D.  LOCATION:  Sleep lab.  INDICATIONS FOR STUDY:  Hypersomnia with sleep apnea.  EPWORTH SLEEPINESS SCORE:  6.  MEDICATIONS:  SLEEP ARCHITECTURE:  The patient had total sleep time of 281 minute with no slow-wave sleep and no REM.  Sleep onset latency was prolonged at 77 minutes and sleep efficiency was moderately reduced at 78%.  RESPIRATORY DATA:  The patient was found to have 19 apneas and 1 obstructive hypopneas, giving her an apnea-hypopnea index of 4 events per hour.  The events occurred all in the supine position, and there was mild snoring noted throughout.  The patient did not meet split night criteria secondary to her small numbers of events.  OXYGEN DATA:  There was O2 desaturation as low as 88% with the patient's obstructive events.  CARDIAC DATA:  No clinically significant arrhythmias were noted.  MOVEMENTS-PARASOMNIA:  The patient had 259 leg jerks, with 6 per hour resulting in arousal or awakening.  There were no abnormal behaviors noted.  IMPRESSIONS-RECOMMENDATIONS: 1. Small numbers of obstructive events, which do not meet the AHI     criteria for the obstructive sleep apnea syndrome.  The patient,     however, did not achieve REM during the night, and it is possible     that her degree of sleep apnea may be underestimated.  However, she     had more than ample opportunity to exhibit clinically significant     sleep apnea. 2. Large numbers of leg jerks with what appears to be significant     sleep disruption.  It is unclear whether this represents a primary     movement disorder of sleep such  as the restless legs syndrome, or     whether it may be     secondary to other causes such as chronic pain, neuropathy, or     other joint issues.  Clinical correlation is suggested.     Barbaraann Share, MD,FCCP Diplomate, American Board of Sleep Medicine    [facility]/MEDQ  D:  01/07/2013 13:52:50  T:  01/08/2013 02:32:12  Job:  [id number]

## 2013-01-13 NOTE — Telephone Encounter (Signed)
Pt called requesting the results of her sleep study. I returned her call and lvm for return call.Deborah [name]

## 2013-01-15 ENCOUNTER — Telehealth: Payer: Self-pay | Admitting: *Deleted

## 2013-01-15 NOTE — Telephone Encounter (Signed)
Please call pt in regards to her sleep study results. Barry Dienes, LPN

## 2013-01-15 NOTE — Telephone Encounter (Signed)
Called pt lvm informing her that I have left several vm asking her to return my call so that she can make an appt to come in to discuss these results.Marland KitchenMarland KitchenLoralee Pacas [place]

## 2013-01-19 NOTE — Telephone Encounter (Signed)
Called pt and informed her that Dr. Linford Arnold would like to discuss these results with her in office. She stated that she will talk with her daughter to schedule an appt time.Deborah [name]

## 2013-01-22 ENCOUNTER — Encounter: Payer: Self-pay | Admitting: Family Medicine

## 2013-01-22 ENCOUNTER — Ambulatory Visit (INDEPENDENT_AMBULATORY_CARE_PROVIDER_SITE_OTHER): Payer: Medicare Other | Admitting: Family Medicine

## 2013-01-22 VITALS — BP 159/87 | HR 69 | Wt 234.0 lb

## 2013-01-22 DIAGNOSIS — I10 Essential (primary) hypertension: Secondary | ICD-10-CM

## 2013-01-22 DIAGNOSIS — G4761 Periodic limb movement disorder: Secondary | ICD-10-CM

## 2013-01-22 LAB — CBC
Hemoglobin: 14 g/dL (ref 12.0–15.0)
MCH: 29.3 pg (ref 26.0–34.0)
MCV: 84.9 fL (ref 78.0–100.0)
RBC: 4.78 MIL/uL (ref 3.87–5.11)
WBC: 8.3 10*3/uL (ref 4.0–10.5)

## 2013-01-22 NOTE — Progress Notes (Signed)
  Subjective:    Patient ID: Deborah [name], female    DOB: 07-12-1946, 67 y.o.   MRN: [id number]  HPI Here to review her sleep study results. She is having problems with he rmemory and recall. She is constantly sleepy and tired. Says doesn't get up until noon somedays and then wants to go back to back after she eats bc she is so tired.    HTN-  Pt denies chest pain, SOB, dizziness, or heart palpitations.  Taking meds as directed w/o problems.  Denies medication side effects.  5 min spent with pt.  Her daugher adn 2 grandkids were here with her today. She was very distracted during the OV.  She wanted her faimly in the room.   They also want to get a TB skin test as they are looking into assissted living for her.   Review of Systems     Objective:   Physical Exam  Constitutional: She is oriented to person, place, and time. She appears well-developed and well-nourished.  HENT:  Head: Normocephalic and atraumatic.  Cardiovascular: Normal rate, regular rhythm and normal heart sounds.   Pulmonary/Chest: Effort normal and breath sounds normal.  Neurological: She is alert and oriented to person, place, and time.  Skin: Skin is warm and dry.  Psychiatric: She has a normal mood and affect. Her behavior is normal.          Assessment & Plan:  Reviewed the sleep study results.  She did have some small number of obstructive events but did have large numbers of leg movments. Over 200 with 6 per hour actually waking her up. I explained to her that that is about once every 10 min on average.  No signficant sleep apnea.  Discussed referring her to sleep specialist.  I really think this is hugely affecting her overall quality of life.  She is ok with this. Wellbutrin can be helpful with Periodic Limb Movement d/o but she is aleady on it.    HTN - elevated today. Repeat BP not much better.  F/U in 2 weeks to recheck. Make sure taking topril and HCTZ daily.    Will need to come back next week for  TB skin test. As had to be read after 48 hours.  Can drop off paperwork for assisted living.

## 2013-01-22 NOTE — Addendum Note (Signed)
Addended by: [name] D on: 01/22/2013 09:57 PM   Modules accepted: Orders

## 2013-01-28 ENCOUNTER — Telehealth: Payer: Self-pay | Admitting: Neurology

## 2013-02-01 ENCOUNTER — Telehealth: Payer: Self-pay

## 2013-02-01 NOTE — Telephone Encounter (Signed)
Patient called and left a message on nurse line asking for a return call.   Returned Call: Left message asking patient to call back.  

## 2013-03-01 ENCOUNTER — Encounter: Payer: Self-pay | Admitting: Pulmonary Disease

## 2013-03-01 ENCOUNTER — Ambulatory Visit (INDEPENDENT_AMBULATORY_CARE_PROVIDER_SITE_OTHER): Payer: Medicare Other | Admitting: Pulmonary Disease

## 2013-03-01 VITALS — BP 122/98 | HR 85 | Temp 97.2°F | Ht 63.0 in | Wt 234.3 lb

## 2013-03-01 DIAGNOSIS — G4761 Periodic limb movement disorder: Secondary | ICD-10-CM

## 2013-03-01 MED ORDER — ROPINIROLE HCL 0.5 MG PO TABS: 0.5000 mg | ORAL_TABLET | Freq: Three times a day (TID) | ORAL | Status: DC

## 2013-03-01 NOTE — Addendum Note (Signed)
Addended by: Nita Sells on: 03/01/2013 04:22 PM   Modules accepted: Orders

## 2013-03-01 NOTE — Patient Instructions (Addendum)
Will try on requip 0.5 mg, take one each night after dinner.  Can increase to two after dinner if you do not see a change after a week.  Do not nap during the day followup with me in 4 weeks.

## 2013-03-01 NOTE — Assessment & Plan Note (Signed)
The patient has a lot of sleep disruption that is multifactorial, including inadequate sleep hygiene, an element of insomnia, and possibly periodic limb movement disorder.  She does not have sleep apnea by her study, but does have large numbers of limb movements with significant sleep disruption.  I would like to try her on a dopamine agonist to see if she sees improvement.  I have also counseled her on good sleep hygiene.

## 2013-03-01 NOTE — Progress Notes (Signed)
  Subjective:    Patient ID: Deborah [name], female    DOB: [birth year], 67 y.o.   MRN: [id number]  HPI The patient is a 67 year old female who I've been asked to see regarding ongoing sleep issues.  She has had a recent sleep study that showed no clinically significant sleep apnea, but she did have large numbers of periodic limb movements with significant sleep disruption.  She does have significant snoring according to a family member, and also has frequent awakenings during the night for unknown reasons.  She does not feel rested in the mornings upon arising.  She denies any classic restless leg syndrome symptoms in the evening or when she goes to bed, however she has been told for years that she kicks a lot during sleep.  The patient had an Epworth score of only 6 today, but the family member states that she naps continually during the day.   Review of Systems  Constitutional: Positive for appetite change and unexpected weight change. Negative for fever.  HENT: Positive for dental problem and sinus pressure. Negative for ear pain, nosebleeds, congestion, sore throat, rhinorrhea, sneezing, trouble swallowing and postnasal drip.   Eyes: Negative for redness and itching.  Respiratory: Positive for cough and shortness of breath. Negative for chest tightness and wheezing.   Cardiovascular: Positive for chest pain and palpitations ( irregular heartbeat). Negative for leg swelling.  Gastrointestinal: Negative for nausea and vomiting.       Acid heartburn//indigestion  Genitourinary: Negative for dysuria.  Musculoskeletal: Positive for joint swelling and arthralgias.  Skin: Negative for rash.  Neurological: Positive for headaches.  Hematological: Does not bruise/bleed easily.  Psychiatric/Behavioral: Positive for dysphoric mood. The patient is nervous/anxious. The patient is not hyperactive.        Objective:   Physical Exam Constitutional:  Obese female, no acute distress  HENT:  Nares  patent without discharge  Oropharynx without exudate, palate and uvula are mildly elongated.   Eyes:  Perrla, eomi, no scleral icterus  Neck:  No JVD, no TMG  Cardiovascular:  Normal rate, regular rhythm, no rubs or gallops.  No murmurs        Intact distal pulses  Pulmonary :  Normal breath sounds, no stridor or respiratory distress   No rales, rhonchi, or wheezing  Abdominal:  Soft, nondistended, bowel sounds present.  No tenderness noted.   Musculoskeletal:  mild lower extremity edema noted.  Lymph Nodes:  No cervical lymphadenopathy noted  Skin:  No cyanosis noted  Neurologic:  Alert, appropriate, moves all 4 extremities without obvious deficit.         Assessment & Plan:

## 2013-03-23 ENCOUNTER — Encounter: Payer: Self-pay | Admitting: Pulmonary Disease

## 2013-03-23 ENCOUNTER — Ambulatory Visit (INDEPENDENT_AMBULATORY_CARE_PROVIDER_SITE_OTHER): Payer: Medicare Other | Admitting: Pulmonary Disease

## 2013-03-23 VITALS — BP 130/90 | HR 106 | Temp 97.7°F | Ht 64.0 in | Wt 232.2 lb

## 2013-03-23 DIAGNOSIS — G4761 Periodic limb movement disorder: Secondary | ICD-10-CM

## 2013-03-23 NOTE — Progress Notes (Signed)
  Subjective:    Patient ID: Deborah [name], female    DOB: 17-Jun-1946, 68 y.o.   MRN: [id number]  HPI The patient comes in today for followup of her sleep disruption.  At the last visit, I felt that her issue was related to poor sleep hygiene, and possibly the periodic limb movement disorder.  She was given a prescription for Requip, but apparently lost this.  She did not call the office to let us know, and decided to just followup today.  Apparently this was lost 3 days after she filled the prescription.   Review of Systems  Constitutional: Negative for fever and unexpected weight change.  HENT: Negative for ear pain, nosebleeds, congestion, sore throat, rhinorrhea, sneezing, trouble swallowing, dental problem, postnasal drip and sinus pressure.   Eyes: Negative for redness and itching.  Respiratory: Positive for shortness of breath. Negative for cough, chest tightness and wheezing.   Cardiovascular: Negative for palpitations and leg swelling.  Gastrointestinal: Negative for nausea and vomiting.  Genitourinary: Negative for dysuria.  Musculoskeletal: Negative for joint swelling.  Skin: Negative for rash.  Neurological: Positive for dizziness, light-headedness and headaches.  Hematological: Does not bruise/bleed easily.  Psychiatric/Behavioral: Negative for dysphoric mood. The patient is not nervous/anxious.        Objective:   Physical Exam Obese female in no acute distress Nose without purulence or discharge noted Neck without lymphadenopathy or thyromegaly Lower extremities with edema, no cyanosis The patient appears to be sleepy, but moves all 4 extremities       Assessment & Plan:

## 2013-03-23 NOTE — Assessment & Plan Note (Signed)
The patient continues to have significant sleep disruption, but has not worked on sleep hygiene issues discussed with her at the last visit.  She also lost her Requip, and never called [place] to get another prescription.  I would therefore like to get her back on Requip for a 4 week trial, and assess whether this is helping her or not.

## 2013-03-23 NOTE — Patient Instructions (Addendum)
Will try the requip again.  Take 0.5mg  one AFTER DINNER each night, and can increase to 2 at dinner after a week if it is helping but not completely Please call us in 4 weeks to give update on your response to the new medication.  We can call in a longer prescription at that time if doing well. Work on sleep schedule as we discussed.

## 2013-03-24 ENCOUNTER — Other Ambulatory Visit: Payer: Self-pay | Admitting: Pulmonary Disease

## 2013-03-24 ENCOUNTER — Other Ambulatory Visit: Payer: Self-pay

## 2013-03-24 MED ORDER — METOPROLOL SUCCINATE ER 100 MG PO TB24: ORAL_TABLET | ORAL | Status: DC

## 2013-03-25 ENCOUNTER — Other Ambulatory Visit: Payer: Self-pay | Admitting: Family Medicine

## 2013-04-18 ENCOUNTER — Other Ambulatory Visit: Payer: Self-pay | Admitting: Family Medicine

## 2013-04-19 ENCOUNTER — Telehealth: Payer: Self-pay | Admitting: Pulmonary Disease

## 2013-04-19 NOTE — Telephone Encounter (Signed)
I would recommend staying on requip, and stopping lunesta.  Agree the lunesta may be the issue when combined with requip.   If she continues to have abnormal behaviors, she is to stop requip and let us know.

## 2013-04-19 NOTE — Telephone Encounter (Signed)
Spoke to pt's daughter. She states that the pt has been taking Requip 0.5mg  2 tablets QHS. This has not been helping. Pt's sleep pattern has not changed, it's helping her sleep but she is not well rested the next day. Daughter reports that the pt has been more agitated, confused and having hallucinations. She is currently taking Lunesta and her daughter is wondering if this is contributing to her problems.  KC - please advise. Thanks.

## 2013-04-19 NOTE — Telephone Encounter (Signed)
lmtcb

## 2013-04-20 ENCOUNTER — Ambulatory Visit (INDEPENDENT_AMBULATORY_CARE_PROVIDER_SITE_OTHER): Payer: Medicare Other | Admitting: Family Medicine

## 2013-04-20 ENCOUNTER — Telehealth: Payer: Self-pay | Admitting: Family Medicine

## 2013-04-20 ENCOUNTER — Encounter: Payer: Self-pay | Admitting: Family Medicine

## 2013-04-20 ENCOUNTER — Other Ambulatory Visit: Payer: Self-pay | Admitting: Family Medicine

## 2013-04-20 VITALS — BP 137/67 | HR 67 | Wt 230.0 lb

## 2013-04-20 DIAGNOSIS — R51 Headache: Secondary | ICD-10-CM

## 2013-04-20 DIAGNOSIS — F0390 Unspecified dementia without behavioral disturbance: Secondary | ICD-10-CM

## 2013-04-20 DIAGNOSIS — N393 Stress incontinence (female) (male): Secondary | ICD-10-CM

## 2013-04-20 DIAGNOSIS — G609 Hereditary and idiopathic neuropathy, unspecified: Secondary | ICD-10-CM

## 2013-04-20 DIAGNOSIS — R3915 Urgency of urination: Secondary | ICD-10-CM

## 2013-04-20 LAB — POCT URINALYSIS DIPSTICK
Bilirubin, UA: NEGATIVE
Blood, UA: NEGATIVE
Glucose, UA: NEGATIVE
Nitrite, UA: NEGATIVE
Spec Grav, UA: >=1.03
Urobilinogen, UA: 0.2

## 2013-04-20 NOTE — Telephone Encounter (Signed)
This has to be collected in a green top or lavaneder top on ice.Deborah [name]

## 2013-04-20 NOTE — Progress Notes (Signed)
Subjective:    Patient ID: MAHOGANIE BASHER, female    DOB: 14-Nov-1945, 67 y.o.   MRN: [id number]  HPI She needs a new referral to neurology. Her previous neurologist, Dr. love at [facility] left the practice. They had called her to transfer her care to her the other physicians there. When she called the daughter that she would need a new referral. Her daughter is worried about progression of her mental status and mental capabilities.Says she is making less sense and having a harder time communicating with her.  Her daughter says she is more angry, confused, and agittated at times.  Her pulmonologist has stopped her lunesta  Still on the requip. Was seeing psych for a long time.  Her daughter has noticed hard time with work finding. They have to take out parts fo the stove at night bc she will try to cook and hurt herself. Her daughter is a notes that she's been using the wrong words at time. For example today she tried to ask about the ponytail holder in her hair or and asked about the :cobweb" in her hair.  Having some urinary frequency.  Her daughter says she is not wiping well and not washing hands consistantly.  Also stress incontinence, esp when laugh. Says the incontinenc is a large amount. Her daughter has noticed that the urine has a very strong odor to it as well. She does wear incontinence pads.  Review of Systems BP 137/67  Pulse 67  Wt 230 lb (104.327 kg)  BMI 39.46 kg/m2    Allergies  Allergen Reactions  . Dicyclomine Hcl     REACTION: anaphylactic reaction  . Omeprazole     REACTION: GI upset.  . Statins Other (See Comments)    myalgias    Past Medical History  Diagnosis Date  . Bipolar disorder     [name], NP  . Depression   . Hyperlipidemia   . Hypertension   . Hiatal hernia   . Esophageal reflux   . Other specified disorders of liver   . Headache(784.0)   . Hepatomegaly   . Other chronic nonalcoholic liver disease   . Cardiomegaly   . Irritable  bowel syndrome   . Diverticulosis of colon (without mention of hemorrhage)   . COPD (chronic obstructive pulmonary disease)   . Nausea   . Family history of colon cancer   . Gout flare     Left toe    Past Surgical History  Procedure Laterality Date  . Cholecystectomy    . Abdominal hysterectomy      partial  . Ovarian cyst removal    . Removal of cyst on chest wall    . Appendectomy      History   Social History  . Marital Status: Widowed    Spouse Name: N/A    Number of Children: 5  . Years of Education: N/A   Occupational History  . Retired     Administrator, sports   Social History Main Topics  . Smoking status: Never Smoker   . Smokeless tobacco: Never Used  . Alcohol Use: No  . Drug Use: No  . Sexual Activity: No   Other Topics Concern  . Not on file   Social History Narrative   Very little caffeine but will drink it     Family History  Problem Relation Age of Onset  . Emphysema Mother   . Heart disease Father   . Mental illness Father  Bipolar disorder  . Colon cancer Maternal Grandfather   . Alzheimer's disease Brother     Outpatient Encounter Prescriptions as of 04/20/2013  Medication Sig Dispense Refill  . azelastine (ASTELIN) 137 MCG/SPRAY nasal spray Place 2 sprays into the nose 2 (two) times daily as needed. Use in each nostril as directed      . beclomethasone (QVAR) 40 MCG/ACT inhaler Inhale 2 puffs into the lungs 2 (two) times daily as needed.       Marland Kitchen buPROPion (WELLBUTRIN SR) 150 MG 12 hr tablet Take 150 mg by mouth daily.        . clonazePAM (KLONOPIN) 1 MG tablet Take 1 mg by mouth 2 (two) times daily as needed.       . DULoxetine (CYMBALTA) 60 MG capsule Take 60 mg by mouth daily.      Marland Kitchen EPINEPHrine (EPIPEN 2-PAK) 0.3 mg/0.3 mL DEVI Inject 0.3 mLs (0.3 mg total) into the muscle as needed. For allergic reaction to beesting  2 Device  0  . fish oil-omega-3 fatty acids 1000 MG capsule Take 1 g by mouth daily.      . fluticasone (FLONASE) 50  MCG/ACT nasal spray Place 2 sprays into the nose daily as needed.      . hydrochlorothiazide (HYDRODIURIL) 25 MG tablet TAKE 1 TABLET (25 MG TOTAL) BY MOUTH DAILY.  30 tablet  3  . ipratropium-albuterol (DUONEB) 0.5-2.5 (3) MG/3ML SOLN Take 3 mLs by nebulization every 4 (four) hours as needed.  1080 mL  1  . metoprolol succinate (TOPROL-XL) 100 MG 24 hr tablet TAKE 1 TABLET (100 MG TOTAL) BY MOUTH DAILY.  30 tablet  3  . NON FORMULARY Patient is on 2 liters of oxygen at night      . rOPINIRole (REQUIP) 0.5 MG tablet Take 0.5mg  AFTER DINNER each night, and can increase to 2 tablets at dinner after a week  60 tablet  0  . [DISCONTINUED] eszopiclone (LUNESTA) 2 MG TABS Take 2 mg by mouth at bedtime. Take immediately before bedtime       No facility-administered encounter medications on file as of 04/20/2013.          Objective:   Physical Exam  Constitutional: She appears well-developed and well-nourished.  HENT:  Head: Normocephalic and atraumatic.  Skin: Skin is warm and dry.  Psychiatric: She has a normal mood and affect. Her behavior is normal.          Assessment & Plan:  Chronic headaches/neuropathy/mental decline-will refer her back to [facility] neurology. Referral placed.  Dementia-Refer to neurology.  It soundsl ike Dr. Sandria Manly had started a workup for this.  it's unclear at this point in time if this is related to her long-term psychiatric disease or just development of Alzheimer's. It could also be a component of vascular dementia especially since she is having what sounds like aphasia. I would like to get an MRI. It actually looks like Dr. love had originally ordered this in December but for some reason she says they were never contacted about this. I will place order now. They live in [place] and preferred have it done at [facility]. We will also do some additional blood work including a CBC, thyroid, B12, vitamin D etc. just rule out other causes that could be worsening  her condition. Certainly her medications could be also exacerbating her underlying dementia so we could certainly look at this separately as well. This is where I think the neurologist help would be great  to review her medications and see what he feels is appropriate or not appropriate. It's also getting to the point that her daughter is having a really hard time taking care of her. She is even considering putting her in some type of assisted living or nursing home situation. It sounds like she has had some additional workup of the mass including sleep study etc. to rule out other causes.  Urinary frequency/stress incontinence - Urinalysis is negative for acute infection. Did encourage her to make sure that she's wiping properly and washing her hands regularly. I think her lack of hygiene may be related to her progressing dementia. We also discussed that there are prescription medications that can help with the urinary frequency and overactive bladder component. Doesn't seem to help a lot for the actual stress incontinence. Also discussed Kegel exercises. We could also refer her to a urologist for further evaluation since her stress incontinence seems to be a large amount she could have a component of bladder prolapse, et Karie Soda. They want to hold off on referral at this time.   Time spent 25 minutes, greater than 50% time counseling about her dementia and urinary incontinence

## 2013-04-20 NOTE — Telephone Encounter (Signed)
I called Deborah [name] and made her aware of recs. Nothing further was needed

## 2013-04-20 NOTE — Telephone Encounter (Signed)
Please call the lab and see if they can add an ammonia level to her blood work that was drawn today.

## 2013-04-20 NOTE — Telephone Encounter (Signed)
Called pt to have her to go to another lab in [place] to have testing done. Unable to lvm phone rang and rang and someone picked up and hung up. Tried 2x to call back and got the same outcome.Deborah [name]

## 2013-04-20 NOTE — Addendum Note (Signed)
Addended by: Deno Etienne on: 04/20/2013 05:03 PM   Modules accepted: Orders

## 2013-04-21 LAB — CBC WITH DIFFERENTIAL/PLATELET
Eosinophils Relative: 5 % (ref 0–5)
HCT: 40.5 % (ref 36.0–46.0)
Lymphocytes Relative: 43 % (ref 12–46)
Lymphs Abs: 3 10*3/uL (ref 0.7–4.0)
MCH: 29.5 pg (ref 26.0–34.0)
MCV: 89 fL (ref 78.0–100.0)
Monocytes Absolute: 0.5 10*3/uL (ref 0.1–1.0)
Monocytes Relative: 8 % (ref 3–12)
RBC: 4.55 MIL/uL (ref 3.87–5.11)
WBC: 7 10*3/uL (ref 4.0–10.5)

## 2013-04-21 LAB — COMPLETE METABOLIC PANEL WITH GFR
ALT: 30 U/L (ref 0–35)
Albumin: 4.5 g/dL (ref 3.5–5.2)
Alkaline Phosphatase: 55 U/L (ref 39–117)
CO2: 32 mEq/L (ref 19–32)
Potassium: 3.6 mEq/L (ref 3.5–5.3)
Sodium: 135 mEq/L (ref 135–145)
Total Bilirubin: 0.4 mg/dL (ref 0.3–1.2)
Total Protein: 7.1 g/dL (ref 6.0–8.3)

## 2013-04-21 LAB — TSH: TSH: 2.298 u[IU]/mL (ref 0.350–4.500)

## 2013-04-21 NOTE — Telephone Encounter (Signed)
lvm informing pt that Dr.Metheney would like for her to have an additional test done and that she can either come here or we can send the orders to a satellite draw station.Deborah [name]

## 2013-05-04 ENCOUNTER — Other Ambulatory Visit: Payer: Self-pay | Admitting: Pulmonary Disease

## 2013-05-18 ENCOUNTER — Ambulatory Visit
Admission: RE | Admit: 2013-05-18 | Discharge: 2013-05-18 | Disposition: A | Discharge status: Home / Self Care | Payer: Medicare Other | Source: Ambulatory Visit | Attending: Family Medicine | Admitting: Family Medicine

## 2013-05-18 DIAGNOSIS — F0390 Unspecified dementia without behavioral disturbance: Secondary | ICD-10-CM

## 2013-05-24 ENCOUNTER — Ambulatory Visit: Payer: Medicare Other | Admitting: Neurology

## 2013-05-25 ENCOUNTER — Telehealth: Payer: Self-pay | Admitting: *Deleted

## 2013-05-25 NOTE — Telephone Encounter (Signed)
Called pt's daughter and lvm to inform her that I had given her mother a message about an appt with [name] 10.7.2014 @ 1030.Loralee Pacas [place]

## 2013-05-30 ENCOUNTER — Other Ambulatory Visit: Payer: Self-pay | Admitting: Pulmonary Disease

## 2013-06-01 ENCOUNTER — Emergency Department (HOSPITAL_COMMUNITY): Payer: Medicare Other

## 2013-06-01 ENCOUNTER — Encounter (HOSPITAL_COMMUNITY): Payer: Self-pay | Admitting: Adult Health

## 2013-06-01 ENCOUNTER — Emergency Department (HOSPITAL_COMMUNITY)
Admission: EM | Admit: 2013-06-01 | Discharge: 2013-06-02 | Disposition: A | Discharge status: Home / Self Care | Payer: Medicare Other | Source: Home / Self Care | Attending: Emergency Medicine | Admitting: Emergency Medicine

## 2013-06-01 ENCOUNTER — Ambulatory Visit (INDEPENDENT_AMBULATORY_CARE_PROVIDER_SITE_OTHER): Payer: Medicare Other | Admitting: Family Medicine

## 2013-06-01 ENCOUNTER — Encounter: Payer: Self-pay | Admitting: Family Medicine

## 2013-06-01 VITALS — BP 134/71 | HR 110 | Wt 222.0 lb

## 2013-06-01 DIAGNOSIS — I10 Essential (primary) hypertension: Secondary | ICD-10-CM | POA: Insufficient documentation

## 2013-06-01 DIAGNOSIS — J449 Chronic obstructive pulmonary disease, unspecified: Secondary | ICD-10-CM | POA: Insufficient documentation

## 2013-06-01 DIAGNOSIS — Z79899 Other long term (current) drug therapy: Secondary | ICD-10-CM | POA: Insufficient documentation

## 2013-06-01 DIAGNOSIS — R4182 Altered mental status, unspecified: Secondary | ICD-10-CM | POA: Insufficient documentation

## 2013-06-01 DIAGNOSIS — J4489 Other specified chronic obstructive pulmonary disease: Secondary | ICD-10-CM | POA: Insufficient documentation

## 2013-06-01 DIAGNOSIS — Z8719 Personal history of other diseases of the digestive system: Secondary | ICD-10-CM | POA: Insufficient documentation

## 2013-06-01 DIAGNOSIS — R443 Hallucinations, unspecified: Secondary | ICD-10-CM | POA: Insufficient documentation

## 2013-06-01 DIAGNOSIS — H539 Unspecified visual disturbance: Secondary | ICD-10-CM | POA: Insufficient documentation

## 2013-06-01 DIAGNOSIS — Z8639 Personal history of other endocrine, nutritional and metabolic disease: Secondary | ICD-10-CM | POA: Insufficient documentation

## 2013-06-01 DIAGNOSIS — Z8679 Personal history of other diseases of the circulatory system: Secondary | ICD-10-CM | POA: Insufficient documentation

## 2013-06-01 DIAGNOSIS — J019 Acute sinusitis, unspecified: Secondary | ICD-10-CM

## 2013-06-01 DIAGNOSIS — Z862 Personal history of diseases of the blood and blood-forming organs and certain disorders involving the immune mechanism: Secondary | ICD-10-CM | POA: Insufficient documentation

## 2013-06-01 DIAGNOSIS — F319 Bipolar disorder, unspecified: Secondary | ICD-10-CM | POA: Insufficient documentation

## 2013-06-01 DIAGNOSIS — F0390 Unspecified dementia without behavioral disturbance: Secondary | ICD-10-CM

## 2013-06-01 DIAGNOSIS — IMO0002 Reserved for concepts with insufficient information to code with codable children: Secondary | ICD-10-CM | POA: Insufficient documentation

## 2013-06-01 DIAGNOSIS — Z8669 Personal history of other diseases of the nervous system and sense organs: Secondary | ICD-10-CM | POA: Insufficient documentation

## 2013-06-01 DIAGNOSIS — E785 Hyperlipidemia, unspecified: Secondary | ICD-10-CM

## 2013-06-01 DIAGNOSIS — Z888 Allergy status to other drugs, medicaments and biological substances status: Secondary | ICD-10-CM | POA: Insufficient documentation

## 2013-06-01 DIAGNOSIS — M109 Gout, unspecified: Secondary | ICD-10-CM | POA: Insufficient documentation

## 2013-06-01 DIAGNOSIS — E119 Type 2 diabetes mellitus without complications: Secondary | ICD-10-CM | POA: Insufficient documentation

## 2013-06-01 LAB — COMPREHENSIVE METABOLIC PANEL
ALT: 41 U/L — ABNORMAL HIGH (ref 0–35)
Albumin: 3.8 g/dL (ref 3.5–5.2)
Alkaline Phosphatase: 70 U/L (ref 39–117)
BUN: 12 mg/dL (ref 6–23)
Calcium: 9.2 mg/dL (ref 8.4–10.5)
Chloride: 93 mEq/L — ABNORMAL LOW (ref 96–112)
GFR calc Af Amer: 76 mL/min — ABNORMAL LOW (ref >90)
Glucose, Bld: 113 mg/dL — ABNORMAL HIGH (ref 70–99)
Potassium: 4.1 mEq/L (ref 3.5–5.1)
Sodium: 135 mEq/L (ref 135–145)
Total Bilirubin: 0.7 mg/dL (ref 0.3–1.2)

## 2013-06-01 LAB — CBC
HCT: 39.4 % (ref 36.0–46.0)
Hemoglobin: 13.9 g/dL (ref 12.0–15.0)
WBC: 9.3 10*3/uL (ref 4.0–10.5)

## 2013-06-01 LAB — GLUCOSE, CAPILLARY: Glucose-Capillary: 121 mg/dL — ABNORMAL HIGH (ref 70–99)

## 2013-06-01 MED ORDER — PITAVASTATIN CALCIUM 2 MG PO TABS: ORAL_TABLET | ORAL | Status: DC

## 2013-06-01 MED ORDER — AMOXICILLIN-POT CLAVULANATE 875-125 MG PO TABS: 1.0000 | ORAL_TABLET | Freq: Two times a day (BID) | ORAL | Status: DC

## 2013-06-01 NOTE — ED Notes (Signed)
Assisted pt to Bathroom.  Pt. In wheelchair.  Pt. Needed instructions for going to bathroom.

## 2013-06-01 NOTE — ED Provider Notes (Signed)
CSN: [id number]     Arrival date & time 06/01/13  2029 History   First MD Initiated Contact with Patient 06/01/13 2310     Chief Complaint  Patient presents with  . Altered Mental Status   (Consider location/radiation/quality/duration/timing/severity/associated sxs/prior Treatment) HPI Comments: Patient is a 67 year old female with past medical history significant for diabetes, hypertension, bipolar disorder, COPD. She is brought by her daughter this evening for evaluation of confusion. This has been going on for approximately one year however seem to worsen this evening. Daughter noticed that she was disoriented and had no idea where she was, what year it was, and was having visual hallucinations. She had an MRI performed approximately 2 weeks ago for which they had an appointment to followup the results earlier this afternoon. They were told that she had several areas that appeared as though she could have had some small strokes. While in the office she was disoriented. She has been seen by neurology in the past but no definite diagnosis has been made. She was seen by Dr. love who the daughter tells me has since retired.  Patient is a 67 y.o. female presenting with altered mental status. The history is provided by the patient.  Altered Mental Status Presenting symptoms: behavior changes and confusion   Severity:  Moderate Most recent episode:  Today Episode history:  Continuous Timing:  Constant Progression:  Unchanged Chronicity:  Recurrent Context: dementia   Context: not alcohol use     Past Medical History  Diagnosis Date  . Bipolar disorder     [name], NP  . Depression   . Hyperlipidemia   . Hypertension   . Hiatal hernia   . Esophageal reflux   . Other specified disorders of liver   . Headache(784.0)   . Hepatomegaly   . Other chronic nonalcoholic liver disease   . Cardiomegaly   . Irritable bowel syndrome   . Diverticulosis of colon (without mention of hemorrhage)    . COPD (chronic obstructive pulmonary disease)   . Nausea   . Family history of colon cancer   . Gout flare     Left toe   Past Surgical History  Procedure Laterality Date  . Cholecystectomy    . Abdominal hysterectomy      partial  . Ovarian cyst removal    . Removal of cyst on chest wall    . Appendectomy     Family History  Problem Relation Age of Onset  . Emphysema Mother   . Heart disease Father   . Mental illness Father     Bipolar disorder  . Colon cancer Maternal Grandfather   . Alzheimer's disease Brother   . Bipolar disorder Brother    History  Substance Use Topics  . Smoking status: Never Smoker   . Smokeless tobacco: Never Used  . Alcohol Use: No   OB History   Grav Para Term Preterm Abortions TAB SAB Ect Mult Living                 Review of Systems  Psychiatric/Behavioral: Positive for confusion.  All other systems reviewed and are negative.    Allergies  Dicyclomine hcl; Omeprazole; and Statins  Home Medications   Current Outpatient Rx  Name  Route  Sig  Dispense  Refill  . amoxicillin-clavulanate (AUGMENTIN) 875-125 MG per tablet   Oral   Take 1 tablet by mouth 2 (two) times daily. Take for 10 days started medication on 06-01-13         .  azelastine (ASTELIN) 137 MCG/SPRAY nasal spray   Nasal   Place 2 sprays into the nose 2 (two) times daily as needed for rhinitis. Use in each nostril as directed         . beclomethasone (QVAR) 40 MCG/ACT inhaler   Inhalation   Inhale 2 puffs into the lungs daily as needed. For congestion         . buPROPion (WELLBUTRIN SR) 150 MG 12 hr tablet   Oral   Take 150 mg by mouth daily.           . clonazePAM (KLONOPIN) 1 MG tablet   Oral   Take 1 mg by mouth 2 (two) times daily as needed for anxiety.          . DULoxetine (CYMBALTA) 60 MG capsule   Oral   Take 60 mg by mouth daily.         Marland Kitchen EPINEPHrine (EPIPEN 2-PAK) 0.3 mg/0.3 mL DEVI   Intramuscular   Inject 0.3 mLs (0.3 mg total)  into the muscle as needed. For allergic reaction to beesting   2 Device   0   . fluticasone (FLONASE) 50 MCG/ACT nasal spray   Nasal   Place 2 sprays into the nose daily as needed.         . hydrochlorothiazide (HYDRODIURIL) 25 MG tablet      TAKE 1 TABLET (25 MG TOTAL) BY MOUTH DAILY.   30 tablet   3   . ipratropium-albuterol (DUONEB) 0.5-2.5 (3) MG/3ML SOLN   Nebulization   Take 3 mLs by nebulization every 4 (four) hours as needed. For shortness of breath         . metoprolol succinate (TOPROL-XL) 100 MG 24 hr tablet      TAKE 1 TABLET (100 MG TOTAL) BY MOUTH DAILY.   30 tablet   3   . NON FORMULARY      Patient is on 2 liters of oxygen at night         . rOPINIRole (REQUIP) 0.5 MG tablet      TAKE 1 TABLET AFTER DINNER EACH NIGHT, AND CAN INCREASE TO 2 TABLETS AT DINNER AFTER A WEEK   60 tablet   0    BP 137/75  Pulse 93  Temp(Src) 98.7 F (37.1 C) (Oral)  Resp 18  SpO2 92% Physical Exam  Nursing note and vitals reviewed. Constitutional: She appears well-developed and well-nourished. No distress.  HENT:  Head: Normocephalic and atraumatic.  Mouth/Throat: Oropharynx is clear and moist.  Eyes: EOM are normal. Pupils are equal, round, and reactive to light.  Neck: Normal range of motion. Neck supple.  Cardiovascular: Normal rate, regular rhythm and normal heart sounds.   No murmur heard. Pulmonary/Chest: Effort normal and breath sounds normal. No respiratory distress. She has no wheezes.  Abdominal: Soft. Bowel sounds are normal. She exhibits no distension. There is no tenderness.  Musculoskeletal: Normal range of motion. She exhibits no edema.  Neurological: She is alert. No cranial nerve deficit. She exhibits normal muscle tone. Coordination normal.  Patient is alert but appears somewhat disoriented. She is unsure of the date or year, what building she is in, and why she is here.  Skin: Skin is warm and dry. She is not diaphoretic.    ED Course   Procedures (including critical care time) Labs Review Labs Reviewed  COMPREHENSIVE METABOLIC PANEL - Abnormal; Notable for the following:    Chloride 93 (*)    Glucose, Bld 113 (*)  AST 41 (*)    ALT 41 (*)    GFR calc non Af Amer 66 (*)    GFR calc Af Amer 76 (*)    All other components within normal limits  GLUCOSE, CAPILLARY - Abnormal; Notable for the following:    Glucose-Capillary 121 (*)    All other components within normal limits  CBC  URINALYSIS, ROUTINE W REFLEX MICROSCOPIC   Imaging Review Dg Chest 2 View  06/01/2013   CLINICAL DATA:  Altered mental status and dry cough  EXAM: CHEST  2 VIEW  COMPARISON:  01/14/2011  FINDINGS: The cardiac silhouette is normal in size and configuration. There is a small hiatal hernia. The mediastinum is otherwise normal in contour. No hilar masses.  The lungs are mildly hyperexpanded but clear. No pleural effusion or pneumothorax.  The bony thorax is demineralized but intact.  IMPRESSION: No acute cardiopulmonary disease   Electronically Signed   By: Amie Portland   On: 06/01/2013 21:38    MDM  No diagnosis found. This patient has a history of confusion that is thought to be due to dementia.  Simply had an MRI performed and found out the results today. She was seen by her primary Dr. and seems somewhat confused at that time. She stated he had difficulty walking this evening so the daughter brought her for evaluation. The patient does appear confused however neurologic exam is nonfocal. Workup here reveals unremarkable blood work and urinalysis. I discussed the case with Dr. Leroy Kennedy from neurology who does not feel as though the patient requires emergent hospitalization. His recommendations are for followup with neurology. She does have this arranged in the near future. She will be discharged with instructions to follow up with a neurologist and return when necessary if there are any problems.  Geoffery Lyons, MD 06/02/13 [phone]

## 2013-06-01 NOTE — Telephone Encounter (Signed)
Patient Instructions    Will try the requip again.  Take 0.5mg  one AFTER DINNER each night, and can increase to 2 at dinner after a week if it is helping but not completely Please call us in 4 weeks to give update on your response to the new medication.  We can call in a longer prescription at that time if doing well. Work on sleep schedule as we discussed.     lmtcb x1 for pt

## 2013-06-01 NOTE — ED Notes (Addendum)
Per daughter: pt has been having increased confusion over the last year, today the confusion and hallucinations became worse. Daughter states he had a MRI 2 weeks ago and went to Primary doctor today, was told that she may be having mini strokes, but unsure. Pt has strong odor of urine. Productive cough.  Pt is not oriented to place, but oriented to self.

## 2013-06-01 NOTE — Patient Instructions (Addendum)

## 2013-06-01 NOTE — Progress Notes (Signed)
  Subjective:    Patient ID: Deborah [name], female    DOB: 1946/02/20, 67 y.o.   MRN: [id number]  HPI Here to followup on dementia. When I last saw her a month ago we have referred her to neurology. She did have an MRI of the brain done that ruled out a mass. She's also here to have a many mental status exam performed. Her daughter says has good days and bad days. Today shei is more confused.  They had to reschedule the Neurology appt for next week. She is having problems with processes. Had memory testing done about 3 years ago at [facility]. Her daughter is now having to help her shower. She is trying to encourage her to be more physically active but says she will do it.  They did stop the lunesta nad seemed a little better overall.  Hasn't had any major confused episodes where doesn't know where she is.  Has been rolling out of the bed at night.  Has been unsteady of her feet.    Chest and nasla congestion x 1 weeks.  No ST or ear pain.  No fever, chills or sweats.  Has been using dayquail.  Feels getting wores. She is more hoarse. Now coughing at night.  No facial pain or HA.    Review of Systems     Objective:   Physical Exam  Constitutional: She is oriented to person, place, and time. She appears well-developed and well-nourished.  HENT:  Head: Normocephalic and atraumatic.  Right Ear: External ear normal.  Left Ear: External ear normal.  Nose: Nose normal.  Mouth/Throat: Oropharynx is clear and moist.  TMs and canals are clear.   Eyes: Conjunctivae and EOM are normal. Pupils are equal, round, and reactive to light.  Neck: Neck supple. No thyromegaly present.  Cardiovascular: Normal rate, regular rhythm and normal heart sounds.   Pulmonary/Chest: Effort normal and breath sounds normal. She has no wheezes.  Lymphadenopathy:    She has no cervical adenopathy.  Neurological: She is alert and oriented to person, place, and time.  Skin: Skin is warm and dry.  Psychiatric: She  has a normal mood and affect.          Assessment & Plan:  Moderate Dementia, on the global deterioration scale -she can no longer survive without some assistance. The she does require some assistance at times with bathing which would technically make her moderately severe. Currently lives with her daughter who does a fair amount for her.  I did a Mini-Mental status on her today. Her score was 10/30.  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 We discussed the importance of controlling blood pressure which looks great today and also controlling cholesterol. She has tried several statins in the past and experienced myalgias. We discussed maybe a trial of 2 mg little low every other day for couple weeks to see if she tolerates it okay.. I do think she is young for such sig progression in her dementia..    Acute sinusitis  - will treat with Augmentin. Call if not significantly better in one week. Or if suddenly gets worse please call sooner. Symptomatic treatment.  Time spetn 25 min, >50% spent counseling about dementia and sinusitis

## 2013-06-02 ENCOUNTER — Encounter (HOSPITAL_COMMUNITY): Payer: Self-pay

## 2013-06-02 ENCOUNTER — Telehealth: Payer: Self-pay | Admitting: *Deleted

## 2013-06-02 ENCOUNTER — Emergency Department (HOSPITAL_COMMUNITY)
Admission: EM | Admit: 2013-06-02 | Discharge: 2013-06-03 | Disposition: A | Discharge status: Other Acute Inpatient Hospital | Payer: Medicare Other | Attending: Emergency Medicine | Admitting: Emergency Medicine

## 2013-06-02 DIAGNOSIS — F319 Bipolar disorder, unspecified: Secondary | ICD-10-CM

## 2013-06-02 LAB — URINALYSIS, ROUTINE W REFLEX MICROSCOPIC
Glucose, UA: NEGATIVE mg/dL
Ketones, ur: 15 mg/dL — AB
Protein, ur: 30 mg/dL — AB
pH: 5 (ref 5.0–8.0)

## 2013-06-02 LAB — CBC
Hemoglobin: 13.7 g/dL (ref 12.0–15.0)
MCH: 29.9 pg (ref 26.0–34.0)
MCHC: 33.9 g/dL (ref 30.0–36.0)
Platelets: 488 10*3/uL — ABNORMAL HIGH (ref 150–400)
RDW: 14 % (ref 11.5–15.5)
WBC: 9.5 10*3/uL (ref 4.0–10.5)

## 2013-06-02 LAB — URINE MICROSCOPIC-ADD ON

## 2013-06-02 MED ORDER — ROPINIROLE HCL 0.25 MG PO TABS
0.2500 mg | ORAL_TABLET | Freq: Three times a day (TID) | ORAL | Status: DC
  Administered 2013-06-03 (×3): 0.25 mg via ORAL
  Filled 2013-06-02 (×5): qty 1

## 2013-06-02 MED ORDER — LORAZEPAM 1 MG PO TABS: 1.0000 mg | ORAL_TABLET | Freq: Three times a day (TID) | ORAL | Status: DC | PRN

## 2013-06-02 MED ORDER — AMOXICILLIN-POT CLAVULANATE 500-125 MG PO TABS
500.0000 mg | ORAL_TABLET | Freq: Three times a day (TID) | ORAL | Status: DC
  Administered 2013-06-03 (×3): 500 mg via ORAL
  Filled 2013-06-02 (×4): qty 1

## 2013-06-02 MED ORDER — CLONAZEPAM 0.5 MG PO TABS
1.0000 mg | ORAL_TABLET | Freq: Two times a day (BID) | ORAL | Status: DC
  Administered 2013-06-03 (×2): 1 mg via ORAL
  Filled 2013-06-02 (×2): qty 2

## 2013-06-02 MED ORDER — METOPROLOL SUCCINATE ER 25 MG PO TB24
25.0000 mg | ORAL_TABLET | Freq: Every day | ORAL | Status: DC
  Administered 2013-06-03: 25 mg via ORAL
  Filled 2013-06-02: qty 1

## 2013-06-02 MED ORDER — AZELASTINE HCL 0.1 % NA SOLN
2.0000 | Freq: Two times a day (BID) | NASAL | Status: DC | PRN
  Filled 2013-06-02: qty 30

## 2013-06-02 MED ORDER — HYDROCHLOROTHIAZIDE 25 MG PO TABS
25.0000 mg | ORAL_TABLET | Freq: Every day | ORAL | Status: DC
  Administered 2013-06-03: 25 mg via ORAL
  Filled 2013-06-02: qty 1

## 2013-06-02 MED ORDER — AMOXICILLIN-POT CLAVULANATE 250-62.5 MG/5ML PO SUSR: 500.0000 mg | Freq: Three times a day (TID) | ORAL | Status: AC

## 2013-06-02 MED ORDER — BUPROPION HCL ER (SR) 150 MG PO TB12
150.0000 mg | ORAL_TABLET | Freq: Every day | ORAL | Status: DC
  Administered 2013-06-03: 150 mg via ORAL
  Filled 2013-06-02: qty 1

## 2013-06-02 MED ORDER — FLUTICASONE PROPIONATE 50 MCG/ACT NA SUSP
2.0000 | Freq: Every day | NASAL | Status: DC
  Administered 2013-06-03: 2 via NASAL
  Filled 2013-06-02: qty 16

## 2013-06-02 MED ORDER — FLUTICASONE PROPIONATE HFA 44 MCG/ACT IN AERO
1.0000 | INHALATION_SPRAY | Freq: Two times a day (BID) | RESPIRATORY_TRACT | Status: DC
  Administered 2013-06-03 (×2): 1 via RESPIRATORY_TRACT
  Filled 2013-06-02: qty 10.6

## 2013-06-02 MED ORDER — DULOXETINE HCL 60 MG PO CPEP
60.0000 mg | ORAL_CAPSULE | Freq: Every day | ORAL | Status: DC
  Administered 2013-06-03: 60 mg via ORAL
  Filled 2013-06-02: qty 1

## 2013-06-02 NOTE — Telephone Encounter (Signed)
Spoke with daughter and informed her that if crisis unit doesn't have pt admitted then she needs to take her back to ED for a work up including a possible head scan. Daughter states crisis unit is on there way and that her mother has locked herself in her bedroom in the last 5 minutes since the first phone call. Daughter states they did bloodwork and urine and told her it was all normal.  Meyer Cory, LPN

## 2013-06-02 NOTE — Telephone Encounter (Signed)
Daughter has called stating pt was taken to ED last night for more confusion and falling. States that she is trying to harm daughter and there is a crisis mobile unit coming out to assess her mother. She is asking for any advise from you.

## 2013-06-02 NOTE — Telephone Encounter (Signed)
Let daughter know that she should not be out of meds, and that makes me concerned she is taking some on her own with her abnormal behavior.  It may be that whatever is going on with her emotionally/neurologically is disrupting her sleep more than anything else.  I would like to change requip to mirapex 0.125 after dinner each night as a trial to see if this helps more than requip.  If doesn't help, would discontinue and not take anything else.
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
## 2013-06-03 ENCOUNTER — Telehealth: Payer: Self-pay | Admitting: *Deleted

## 2013-06-03 ENCOUNTER — Emergency Department (HOSPITAL_COMMUNITY): Payer: Medicare Other

## 2013-06-03 DIAGNOSIS — R443 Hallucinations, unspecified: Secondary | ICD-10-CM

## 2013-06-03 LAB — URINALYSIS, ROUTINE W REFLEX MICROSCOPIC
Glucose, UA: NEGATIVE mg/dL
Hgb urine dipstick: NEGATIVE
Ketones, ur: NEGATIVE mg/dL
Leukocytes, UA: NEGATIVE
Nitrite: NEGATIVE
Protein, ur: 100 mg/dL — AB
Specific Gravity, Urine: 1.041 — ABNORMAL HIGH (ref 1.005–1.030)
Urobilinogen, UA: 0.2 mg/dL (ref 0.0–1.0)
pH: 5 (ref 5.0–8.0)

## 2013-06-03 LAB — RAPID URINE DRUG SCREEN, HOSP PERFORMED
Barbiturates: NOT DETECTED
Benzodiazepines: NOT DETECTED
Cocaine: NOT DETECTED
Opiates: NOT DETECTED
Tetrahydrocannabinol: NOT DETECTED

## 2013-06-03 LAB — COMPREHENSIVE METABOLIC PANEL
ALT: 52 U/L — ABNORMAL HIGH (ref 0–35)
AST: 48 U/L — ABNORMAL HIGH (ref 0–37)
Albumin: 3.8 g/dL (ref 3.5–5.2)
Alkaline Phosphatase: 69 U/L (ref 39–117)
Calcium: 9.8 mg/dL (ref 8.4–10.5)
GFR calc Af Amer: 69 mL/min — ABNORMAL LOW (ref >90)
Potassium: 4.1 mEq/L (ref 3.5–5.1)
Sodium: 133 mEq/L — ABNORMAL LOW (ref 135–145)
Total Protein: 8.2 g/dL (ref 6.0–8.3)

## 2013-06-03 LAB — SALICYLATE LEVEL: Salicylate Lvl: <2 mg/dL — ABNORMAL LOW (ref 2.8–20.0)

## 2013-06-03 LAB — URINE MICROSCOPIC-ADD ON

## 2013-06-03 LAB — ACETAMINOPHEN LEVEL: Acetaminophen (Tylenol), Serum: <15 ug/mL (ref 10–30)

## 2013-06-03 NOTE — ED Notes (Signed)
Two rings, watch and magnet bracelet removed and taken by patient's daughter at bedside.

## 2013-06-03 NOTE — ED Notes (Addendum)
EKG given to EDP, Opitz,MD. 

## 2013-06-03 NOTE — ED Notes (Signed)
[place]wich given to pt.

## 2013-06-03 NOTE — Telephone Encounter (Signed)
LMOM x 2 Returning call

## 2013-06-03 NOTE — Progress Notes (Addendum)
Pt pending review [facility], however upon return call, [facility] now at capacity.  Pt pending review [place].   Catha Gosselin, LCSW [phone]  ED CSW .06/03/2013 1027am

## 2013-06-03 NOTE — ED Provider Notes (Signed)
Pt accepted by Dr. Ronne Binning to [facility].  Rolan Bucco, MD 06/03/13 [phone]

## 2013-06-03 NOTE — ED Notes (Addendum)
Pt. And belongings wanded and searched by security.pt. Has 1 belongings bag. Pt. Has shirt,underwear, shoes, pant. Pt. Daughters taking purse and jewelry home. Pt. Belongings locked up at the nurses station in the yellow zone.

## 2013-06-03 NOTE — Progress Notes (Signed)
Call from [place] at [facility] that pt has been accepted by Dr. Rodell Perna.  Report can be called to [phone].    Pts daughter, Ms. Cresenciano Genre is at bedside.  CSW notified her of her mother's plan, and gave daughter unit telephone number to the accepting unit so that family could follow up.  CSW notified EDP that pt has been accepted and pt will be ready for discharge.   Marva Panda, Theresia Majors  [phone]  .06/03/2013  4:45 pm
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
## 2013-06-03 NOTE — Consult Note (Signed)
  Psychiatric Specialty Exam: @PHYSEXAMBYAGE2 @  @ROS @  Blood pressure 129/75, pulse 81, temperature 98.9 F (37.2 C), temperature source Oral, resp. rate 18, SpO2 98.00%.There is no weight on file to calculate BMI.  General Appearance: Disheveled  Eye Contact::  Good  Speech:  Normal Rate  Volume:  Normal  Mood:  Euthymic  Affect:  Appropriate  Thought Process:  Disorganized  Orientation:  Full (Time, Place, and Person)  Thought Content:  Hallucinations: Auditory Visual  Suicidal Thoughts:  No  Homicidal Thoughts:  No  Memory:  did not assess today  Judgement:  Poor  Insight:  Lacking  Psychomotor Activity:  Normal  Concentration:  Good  Recall:  Good  Akathisia:  Negative  Handed:  Right  AIMS (if indicated):     Assets:  Communication Skills  Sleep:   reportedly okay  Deborah [name] is pleasant but does see and talk to people who are not there.  She reportedly has been showing some early dementia as well.  Continue seeking hospitalization. She says she does have "hallucinations" sometimes.

## 2013-06-03 NOTE — BH Assessment (Signed)
[facility] requests facesheet and UA. They also need copy of IVC. Writer faxed facesheet and UA results. TTS at [facility] will faxed IVC paperwork to [facility].  Evette Cristal, [place] Assessment Counselor

## 2013-06-03 NOTE — ED Notes (Signed)
Pt up to bathroom, linen and scrub change. Denies pain.
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
## 2013-06-03 NOTE — ED Notes (Signed)
Bed: ZO10 Expected date:  Expected time:  Means of arrival:  Comments: Room 10

## 2013-06-03 NOTE — Telephone Encounter (Signed)
LMOM x 1 to return call. Need to discuss meds/changes

## 2013-06-03 NOTE — ED Notes (Signed)
Telepysh at bedside.

## 2013-06-04 NOTE — Telephone Encounter (Signed)
I am so sorry, Tell her thank you for keeping up updated. I hope they can get her stabilized.

## 2013-06-05 NOTE — ED Notes (Signed)
Staff member from [facility] called to follow up on arrival of pt. Pt was to come to them on Thursday. Pt has not arrived according to them. Opened chart, plans were for pt to go to [facility], [facility], [facility] according to notes.

## 2013-06-08 ENCOUNTER — Ambulatory Visit: Payer: Medicare Other | Admitting: Neurology

## 2013-07-27 ENCOUNTER — Ambulatory Visit: Payer: Medicare Other | Admitting: Family Medicine

## 2013-07-27 DIAGNOSIS — Z0289 Encounter for other administrative examinations: Secondary | ICD-10-CM

## 2013-11-10 NOTE — Telephone Encounter (Signed)
Pt did not call to schedule apt closing encounter

## 2013-12-31 ENCOUNTER — Telehealth: Payer: Self-pay

## 2013-12-31 NOTE — Telephone Encounter (Signed)
Dr.Lauenstein, Pt would like to speak with you, pt seemed really confused about what she was calling about she just wanted to speak with you and would not give any further details.   Best# [phone]

## 2013-12-31 NOTE — Telephone Encounter (Signed)
Tried to call patient.  No answer.  No VM. 

## 2014-01-03 NOTE — Telephone Encounter (Signed)
Spoke to patient.  She states that she would rather talk to Dr. Milus GlazierLauenstein at a later time.  She is currently in the hospital and will just call back when she is ready to talk to him.

## 2014-03-15 ENCOUNTER — Ambulatory Visit (HOSPITAL_BASED_OUTPATIENT_CLINIC_OR_DEPARTMENT_OTHER): Payer: Medicare Other | Attending: Emergency Medicine | Admitting: Radiology

## 2014-03-15 VITALS — Ht 63.0 in | Wt 209.0 lb

## 2014-03-15 DIAGNOSIS — R0609 Other forms of dyspnea: Secondary | ICD-10-CM | POA: Diagnosis not present

## 2014-03-15 DIAGNOSIS — G4730 Sleep apnea, unspecified: Secondary | ICD-10-CM | POA: Diagnosis present

## 2014-03-15 DIAGNOSIS — R0989 Other specified symptoms and signs involving the circulatory and respiratory systems: Secondary | ICD-10-CM | POA: Insufficient documentation

## 2014-03-15 DIAGNOSIS — G4761 Periodic limb movement disorder: Secondary | ICD-10-CM | POA: Insufficient documentation

## 2014-03-15 DIAGNOSIS — G4733 Obstructive sleep apnea (adult) (pediatric): Secondary | ICD-10-CM | POA: Diagnosis not present

## 2014-03-15 DIAGNOSIS — G4710 Hypersomnia, unspecified: Secondary | ICD-10-CM | POA: Diagnosis present

## 2014-03-26 DIAGNOSIS — G4733 Obstructive sleep apnea (adult) (pediatric): Secondary | ICD-10-CM

## 2014-03-26 NOTE — Sleep Study (Signed)
   NAME: Deborah [name] DATE OF BIRTH:  [birth year] MEDICAL RECORD NUMBER [id number]  LOCATION: [facility]  PHYSICIAN: YOUNG,CLINTON D  DATE OF STUDY: 03/15/2014  SLEEP STUDY TYPE: Nocturnal Polysomnogram               REFERRING PHYSICIAN: Hildred LaserKolesnikova, Milena, MD  INDICATION FOR STUDY: Hypersomnia with sleep apnea  EPWORTH SLEEPINESS SCORE:   11/24 HEIGHT: 5\' 3"  (160 cm)  WEIGHT: 94.802 kg (209 lb)    Body mass index is 37.03 kg/(m^2).  NECK SIZE: 16 in.  MEDICATIONS: Charted for review  SLEEP ARCHITECTURE: Total sleep time 369 minutes with sleep efficiency 85.3%. Stage I was 16.3%, stage II 82.7%, stage III absent, REM 1.1% of total sleep time. Sleep latency 4.5 minutes, REM latency 419 minutes, awake after sleep onset 60 minutes, arousal index 35.1, bedtime medication: None  RESPIRATORY DATA: Apnea hypopneas index (AHI) 6.3 per hour. 39 total events scored including 15 obstructive apneas, 2 mixed apneas, 22 hypopneas. Events in all positions. REM AHI of 45 per hour. There were not enough events to the protocol requirements for split CPAP titration.  OXYGEN DATA: Moderately loud snoring with oxygen desaturation nadir 78% and mean saturation 92% on room air  CARDIAC DATA: Normal sinus rhythm  MOVEMENT/PARASOMNIA: 580 total limb jerks which 75 were associated with arousal or awakening for periodic limb movement with arousal index of 12.2 per hour. Bathroom x3.  IMPRESSION/ RECOMMENDATION:   1) Very mild obstructive sleep apnea/hypoxia syndrome, AHI 6.3 per hour with non-positional events. REM AHI 45 per hour. Moderately loud snoring with oxygen desaturation to a nadir of 78% and mean saturation 92% on room air. 2 Scores in this range are usually adequately addressed with conservative measures including attention to weight, nasal airway obstruction and encouragement to sleep off flat of back. On an individual basis, CPAP or an oral appliance might be  considered. 3) Significant Periodic Limb Movement With Arousal Syndrome, 580 limb jerks counted of which 75 were associated with arousal or awakening for a periodic limb movement with arousal index of 12.2 per hour. This pattern and the mild sleep apnea, together with 3 bathroom trips, contributed to a significantly fragmented sleep pattern with frequent brief awakenings throughout the night. Specific therapeutic trial with Requip or Mirapex might be considered if clinically appropriate. 4) A previous polysomnogram on 12/28/2012 recorded AHI of 4.3 per hour with body weight 236 pounds.  Signed Jetty Duhamellinton Young M.D. Waymon BudgeYOUNG,CLINTON D Diplomate, American Board of Sleep Medicine  ELECTRONICALLY SIGNED ON:  03/26/2014, 3:54 PM [facility] PH: [phone]   FX: [phone] ACCREDITED BY THE AMERICAN ACADEMY OF SLEEP MEDICINE

## 2014-05-04 ENCOUNTER — Emergency Department (HOSPITAL_COMMUNITY)
Admission: EM | Admit: 2014-05-04 | Discharge: 2014-05-05 | Disposition: A | Discharge status: Home / Self Care | Payer: Medicare Other | Attending: Emergency Medicine | Admitting: Emergency Medicine

## 2014-05-04 ENCOUNTER — Encounter (HOSPITAL_COMMUNITY): Payer: Self-pay | Admitting: Emergency Medicine

## 2014-05-04 DIAGNOSIS — Z79899 Other long term (current) drug therapy: Secondary | ICD-10-CM | POA: Insufficient documentation

## 2014-05-04 DIAGNOSIS — L03211 Cellulitis of face: Principal | ICD-10-CM

## 2014-05-04 DIAGNOSIS — J449 Chronic obstructive pulmonary disease, unspecified: Secondary | ICD-10-CM | POA: Insufficient documentation

## 2014-05-04 DIAGNOSIS — I10 Essential (primary) hypertension: Secondary | ICD-10-CM | POA: Insufficient documentation

## 2014-05-04 DIAGNOSIS — F319 Bipolar disorder, unspecified: Secondary | ICD-10-CM | POA: Diagnosis not present

## 2014-05-04 DIAGNOSIS — Z862 Personal history of diseases of the blood and blood-forming organs and certain disorders involving the immune mechanism: Secondary | ICD-10-CM | POA: Insufficient documentation

## 2014-05-04 DIAGNOSIS — L0201 Cutaneous abscess of face: Secondary | ICD-10-CM | POA: Insufficient documentation

## 2014-05-04 DIAGNOSIS — Z8719 Personal history of other diseases of the digestive system: Secondary | ICD-10-CM | POA: Diagnosis not present

## 2014-05-04 DIAGNOSIS — J4489 Other specified chronic obstructive pulmonary disease: Secondary | ICD-10-CM | POA: Insufficient documentation

## 2014-05-04 DIAGNOSIS — Z85038 Personal history of other malignant neoplasm of large intestine: Secondary | ICD-10-CM | POA: Diagnosis not present

## 2014-05-04 DIAGNOSIS — IMO0002 Reserved for concepts with insufficient information to code with codable children: Secondary | ICD-10-CM | POA: Insufficient documentation

## 2014-05-04 DIAGNOSIS — Z792 Long term (current) use of antibiotics: Secondary | ICD-10-CM | POA: Insufficient documentation

## 2014-05-04 DIAGNOSIS — Z8639 Personal history of other endocrine, nutritional and metabolic disease: Secondary | ICD-10-CM | POA: Insufficient documentation

## 2014-05-04 MED ORDER — HYDROCODONE-ACETAMINOPHEN 5-325 MG PO TABS
1.0000 | ORAL_TABLET | ORAL | Status: DC | PRN
Start: 2014-05-04 — End: 2014-05-14

## 2014-05-04 MED ORDER — LIDOCAINE HCL 1 % IJ SOLN
10.0000 mL | Freq: Once | INTRAMUSCULAR | Status: AC
  Administered 2014-05-04: 10 mL
  Filled 2014-05-04: qty 20

## 2014-05-04 MED ORDER — VANCOMYCIN HCL IN DEXTROSE 1-5 GM/200ML-% IV SOLN
1000.0000 mg | Freq: Once | INTRAVENOUS | Status: AC
  Administered 2014-05-04: 1000 mg via INTRAVENOUS
  Filled 2014-05-04: qty 200

## 2014-05-04 NOTE — ED Notes (Signed)
Per EMS, pt from [place] at [facility].  Sent here for L facial abscess with drainage.  Concerned about staph infection.

## 2014-05-04 NOTE — ED Notes (Signed)
Pt reports she does not recall how long she's had the abscess on her face until someone at the facility noticed.  Scabbing sore noted on L side of her face, redness noted around the area.  Area is also warm to touch.  Redness extends to her L eye, no visual changes reported by pt at this time.

## 2014-05-04 NOTE — Discharge Instructions (Signed)

## 2014-05-04 NOTE — ED Notes (Signed)
Bed: ZO10 Expected date:  Expected time:  Means of arrival:  Comments: Elderly, staph infection of face

## 2014-05-04 NOTE — ED Provider Notes (Signed)
CSN: [id number]     Arrival date & time 05/04/14  1921 History   First MD Initiated Contact with Patient 05/04/14 2039     Chief Complaint  Patient presents with  . Abscess     (Consider location/radiation/quality/duration/timing/severity/associated sxs/prior Treatment) HPI Comments: Patient presents with a facial abscess. She states she's had worsening redness and pain to the area for the last week. She's been picking at it and she's had some drainage from the site. She denies any fevers or vomiting. She denies any past history of skin infections.  Patient is a 68 y.o. female presenting with abscess.  Abscess Associated symptoms: no fatigue, no fever, no headaches, no nausea and no vomiting     Past Medical History  Diagnosis Date  . Bipolar disorder     [name], NP  . Depression   . Hyperlipidemia   . Hypertension   . Hiatal hernia   . Esophageal reflux   . Other specified disorders of liver   . Headache(784.0)   . Hepatomegaly   . Other chronic nonalcoholic liver disease   . Cardiomegaly   . Irritable bowel syndrome   . Diverticulosis of colon (without mention of hemorrhage)   . COPD (chronic obstructive pulmonary disease)   . Nausea   . Family history of colon cancer   . Gout flare     Left toe   Past Surgical History  Procedure Laterality Date  . Cholecystectomy    . Abdominal hysterectomy      partial  . Ovarian cyst removal    . Removal of cyst on chest wall    . Appendectomy     Family History  Problem Relation Age of Onset  . Emphysema Mother   . Heart disease Father   . Mental illness Father     Bipolar disorder  . Colon cancer Maternal Grandfather   . Alzheimer's disease Brother   . Bipolar disorder Brother    History  Substance Use Topics  . Smoking status: Never Smoker   . Smokeless tobacco: Never Used  . Alcohol Use: No   OB History   Grav Para Term Preterm Abortions TAB SAB Ect Mult Living                 Review of Systems   Constitutional: Negative for fever, chills, diaphoresis and fatigue.  HENT: Negative for congestion, rhinorrhea and sneezing.   Eyes: Negative.   Respiratory: Negative for cough, chest tightness and shortness of breath.   Cardiovascular: Negative for chest pain and leg swelling.  Gastrointestinal: Negative for nausea, vomiting, abdominal pain, diarrhea and blood in stool.  Genitourinary: Negative for frequency, hematuria, flank pain and difficulty urinating.  Musculoskeletal: Negative for arthralgias and back pain.  Skin: Positive for wound. Negative for rash.  Neurological: Negative for dizziness, speech difficulty, weakness, numbness and headaches.      Allergies  Dicyclomine hcl; Omeprazole; and Statins  Home Medications   Prior to Admission medications   Medication Sig Start Date End Date Taking? Authorizing Provider  allopurinol (ZYLOPRIM) 100 MG tablet Take 100 mg by mouth daily.   Yes Historical Provider, MD  amLODipine (NORVASC) 5 MG tablet Take 5 mg by mouth daily.   Yes Historical Provider, MD  azelastine (ASTELIN) 137 MCG/SPRAY nasal spray Place 2 sprays into the nose 2 (two) times daily as needed for rhinitis. Use in each nostril as directed 12/20/11  Yes Maurice March, MD  clindamycin (CLEOCIN) 300 MG capsule Take 300 mg  by mouth every 8 (eight) hours. For 10 days.  Starting 05/03/14.   Yes Historical Provider, MD  cloNIDine (CATAPRES) 0.1 MG tablet Take 0.1 mg by mouth daily as needed (SBP).   Yes Historical Provider, MD  DULoxetine (CYMBALTA) 60 MG capsule Take 60 mg by mouth daily.   Yes Historical Provider, MD  famotidine (PEPCID) 20 MG tablet Take 20 mg by mouth daily.   Yes Historical Provider, MD  Fluticasone Propionate, Inhal, (FLOVENT DISKUS) 100 MCG/BLIST AEPB Inhale 1 puff into the lungs every 12 (twelve) hours.   Yes Historical Provider, MD  ipratropium-albuterol (DUONEB) 0.5-2.5 (3) MG/3ML SOLN Take 3 mLs by nebulization every 6 (six) hours. For shortness  of breath 07/05/11  Yes Agapito Games, MD  LORazepam (ATIVAN) 0.5 MG tablet Take 0.5 mg by mouth every 8 (eight) hours.   Yes Historical Provider, MD  losartan (COZAAR) 50 MG tablet Take 50 mg by mouth daily.   Yes Historical Provider, MD  metoprolol succinate (TOPROL-XL) 100 MG 24 hr tablet Take 100 mg by mouth daily. Take with or immediately following a meal.   Yes Historical Provider, MD  polyethylene glycol (MIRALAX / GLYCOLAX) packet Take 17 g by mouth daily.   Yes Historical Provider, MD  rOPINIRole (REQUIP) 2 MG tablet Take 2 mg by mouth at bedtime.   Yes Historical Provider, MD  EPINEPHrine (EPIPEN 2-PAK) 0.3 mg/0.3 mL DEVI Inject 0.3 mLs (0.3 mg total) into the muscle as needed. For allergic reaction to beesting 06/01/12   Agapito Games, MD  HYDROcodone-acetaminophen (NORCO/VICODIN) 5-325 MG per tablet Take 1-2 tablets by mouth every 4 (four) hours as needed for moderate pain or severe pain. 05/04/14   Rolan Bucco, MD   BP 129/65  Pulse 92  Temp(Src) 97.6 F (36.4 C) (Oral)  Resp 18  SpO2 98% Physical Exam  Constitutional: She is oriented to person, place, and time. She appears well-developed and well-nourished.  HENT:  Head: Normocephalic and atraumatic.  Eyes: Pupils are equal, round, and reactive to light.  Neck: Normal range of motion. Neck supple.  Cardiovascular: Normal rate, regular rhythm and normal heart sounds.   Pulmonary/Chest: Effort normal and breath sounds normal. No respiratory distress. She has no wheezes. She has no rales. She exhibits no tenderness.  Abdominal: Soft. Bowel sounds are normal. There is no tenderness. There is no rebound and no guarding.  Musculoskeletal: Normal range of motion. She exhibits no edema.  Lymphadenopathy:    She has no cervical adenopathy.  Neurological: She is alert and oriented to person, place, and time.  Skin: Skin is warm and dry. No rash noted.  Patient has a 2 cm and treated abscess with a central area of  fluctuance to her left cheek. There some surrounding erythema involving the maxilla and infraorbital area. There's no swelling or redness involving the eye. No drainage is noted.  Psychiatric: She has a normal mood and affect.    ED Course  INCISION AND DRAINAGE Date/Time: 05/04/2014 11:30 PM Performed by: Jevin Camino Authorized by: Rolan Bucco Consent: Verbal consent obtained. Risks and benefits: risks, benefits and alternatives were discussed Type: abscess Body area: head/neck Location details: face Anesthesia: local infiltration Local anesthetic: lidocaine 1% without epinephrine Anesthetic total: 3 ml Patient sedated: no Scalpel size: 11 Incision type: single straight Complexity: simple Drainage: purulent Drainage amount: scant Wound treatment: wound left open Patient tolerance: Patient tolerated the procedure well with no immediate complications. Comments: Wound deloculated   (including critical care time) Labs Review  Labs Reviewed - No data to display  Imaging Review No results found.   EKG Interpretation None      MDM   Final diagnoses:  Facial abscess    Pt started clinda yesterday, will give one dose of vanc tonight, continue clinda, close f/u.    Rolan Bucco, MD 05/04/14 [phone]

## 2014-05-06 ENCOUNTER — Ambulatory Visit (INDEPENDENT_AMBULATORY_CARE_PROVIDER_SITE_OTHER): Payer: Medicare Other | Admitting: Sports Medicine

## 2014-05-06 ENCOUNTER — Encounter: Payer: Self-pay | Admitting: Sports Medicine

## 2014-05-06 VITALS — BP 116/61 | HR 75 | Ht 66.0 in | Wt 233.0 lb

## 2014-05-06 DIAGNOSIS — L03211 Cellulitis of face: Secondary | ICD-10-CM

## 2014-05-06 DIAGNOSIS — L0201 Cutaneous abscess of face: Secondary | ICD-10-CM | POA: Insufficient documentation

## 2014-05-06 HISTORY — DX: Cutaneous abscess of face: L02.01

## 2014-05-06 NOTE — Progress Notes (Signed)
Patient ID: Deborah [name], female   DOB: 28-Jan-1946, 68 y.o.   MRN: [id number]  Subjective:    CC: Left cheek abscess  HPI: Deborah [name] is a 68 year old female who presents for follow-up after drainage of a left cheek abscess done at [facility] ED two days ago (9/2). The abscess first appeared five days ago (8/30) and she was started on a 7 day course of clindamycin 300 mg PO q8h on 8/31. This antibiotic course was changed to a 10 day course of clindamycin 300 mg PO q8h on 9/2. Patient reports that the abscess was initially firm and red throughout the entire left cheek, but has grown smaller after the drainage. Abscess is not painful or causing significant discomfort.  Past medical history, Surgical history, Family history not pertinant except as noted below, Social history, Allergies, and medications have been entered into the medical record, reviewed, and no changes needed.   Review of Systems: No fevers, chills, night sweats, weight loss, chest pain, or shortness of breath.   Objective:    General: Well developed, well nourished, and in no acute distress.  Neuro: Alert and oriented x3, extra-ocular muscles intact, sensation grossly intact.  HEENT: Normocephalic, atraumatic, pupils equal round reactive to light, neck supple, no masses, no lymphadenopathy, thyroid nonpalpable.  Skin: Warm and dry, no rashes. Abscess is present over the left cheek and is roughly 1 centimeter in size. It is nodular with redness and firmness at a diameter of roughly 5 centimeters. Not particularly fluctuant. Cardiac: Regular rate and rhythm, no murmurs rubs or gallops, no lower extremity edema.  Respiratory: Clear to auscultation bilaterally. Not using accessory muscles, speaking in full sentences.  Impression and Recommendations:   Left Cheek Abscess: She is currently 2 days post incision and drainage in the ED. As no culture was sent and clindamycin does not have ideal coverage for MRSA,  we will switch to doxycycline. - Doxycycline - Continue daily dressing changes - Return in one week to determine if future incision and drainage with packing will be necessary

## 2014-05-06 NOTE — Assessment & Plan Note (Signed)
Post incision and drainage in the emergency department 2 days ago, no culture was collected, she was started on clindamycin. Appearance is significantly better per patient and caregiver. We're going to switch to doxycycline, continue daily dressing changes. Return in a week. The abscess was fairly nodular but not particularly fluctuant, I will see her back in a week and we can determine whether she needs an incision and drainage with packing.

## 2014-05-13 ENCOUNTER — Encounter: Payer: Self-pay | Admitting: Sports Medicine

## 2014-05-13 ENCOUNTER — Ambulatory Visit (INDEPENDENT_AMBULATORY_CARE_PROVIDER_SITE_OTHER): Payer: Medicare Other | Admitting: Sports Medicine

## 2014-05-13 VITALS — BP 124/73 | HR 78 | Wt 230.0 lb

## 2014-05-13 DIAGNOSIS — L03211 Cellulitis of face: Secondary | ICD-10-CM

## 2014-05-13 DIAGNOSIS — L0201 Cutaneous abscess of face: Secondary | ICD-10-CM

## 2014-05-13 NOTE — Assessment & Plan Note (Signed)
Clinically resolved. Return as needed. 

## 2014-05-13 NOTE — Progress Notes (Signed)
  Subjective:    CC: Followup  HPI: This is a very pleasant 68 year old female, she had a left facial abscess that is now resolved after a course of clindamycin, and doxycycline. Happy with results so far.  Past medical history, Surgical history, Family history not pertinant except as noted below, Social history, Allergies, and medications have been entered into the medical record, reviewed, and no changes needed.   Review of Systems: No fevers, chills, night sweats, weight loss, chest pain, or shortness of breath.   Objective:    General: Well Developed, well nourished, and in no acute distress.  Neuro: Alert and oriented x3, extra-ocular muscles intact, sensation grossly intact.  HEENT: Normocephalic, atraumatic, pupils equal round reactive to light, neck supple, no masses, no lymphadenopathy, thyroid nonpalpable.  Skin: Warm and dry, no rashes. There is a slight area of nodularity in the right cheek but no erythema, induration, or tenderness. Cardiac: Regular rate and rhythm, no murmurs rubs or gallops, no lower extremity edema.  Respiratory: Clear to auscultation bilaterally. Not using accessory muscles, speaking in full sentences.  Impression and Recommendations:

## 2014-05-14 ENCOUNTER — Emergency Department (HOSPITAL_COMMUNITY): Payer: Medicare Other

## 2014-05-14 ENCOUNTER — Inpatient Hospital Stay (HOSPITAL_COMMUNITY)
Admission: EM | Admit: 2014-05-14 | Discharge: 2014-05-18 | DRG: 064 | Disposition: A | Discharge status: Skilled Nursing Facility | Payer: Medicare Other | Attending: Neurology | Admitting: Neurology

## 2014-05-14 ENCOUNTER — Inpatient Hospital Stay (HOSPITAL_COMMUNITY): Payer: Medicare Other

## 2014-05-14 ENCOUNTER — Encounter (HOSPITAL_COMMUNITY): Payer: Self-pay | Admitting: Emergency Medicine

## 2014-05-14 DIAGNOSIS — G936 Cerebral edema: Secondary | ICD-10-CM | POA: Diagnosis present

## 2014-05-14 DIAGNOSIS — F319 Bipolar disorder, unspecified: Secondary | ICD-10-CM | POA: Diagnosis present

## 2014-05-14 DIAGNOSIS — E785 Hyperlipidemia, unspecified: Secondary | ICD-10-CM | POA: Diagnosis present

## 2014-05-14 DIAGNOSIS — J449 Chronic obstructive pulmonary disease, unspecified: Secondary | ICD-10-CM | POA: Diagnosis present

## 2014-05-14 DIAGNOSIS — Z79899 Other long term (current) drug therapy: Secondary | ICD-10-CM

## 2014-05-14 DIAGNOSIS — I10 Essential (primary) hypertension: Secondary | ICD-10-CM | POA: Diagnosis present

## 2014-05-14 DIAGNOSIS — K449 Diaphragmatic hernia without obstruction or gangrene: Secondary | ICD-10-CM | POA: Diagnosis present

## 2014-05-14 DIAGNOSIS — J4489 Other specified chronic obstructive pulmonary disease: Secondary | ICD-10-CM | POA: Diagnosis present

## 2014-05-14 DIAGNOSIS — I639 Cerebral infarction, unspecified: Secondary | ICD-10-CM | POA: Diagnosis present

## 2014-05-14 DIAGNOSIS — I6200 Nontraumatic subdural hemorrhage, unspecified: Secondary | ICD-10-CM | POA: Diagnosis present

## 2014-05-14 DIAGNOSIS — F0390 Unspecified dementia without behavioral disturbance: Secondary | ICD-10-CM | POA: Diagnosis present

## 2014-05-14 DIAGNOSIS — G8190 Hemiplegia, unspecified affecting unspecified side: Secondary | ICD-10-CM | POA: Diagnosis present

## 2014-05-14 DIAGNOSIS — M109 Gout, unspecified: Secondary | ICD-10-CM | POA: Diagnosis present

## 2014-05-14 DIAGNOSIS — I619 Nontraumatic intracerebral hemorrhage, unspecified: Principal | ICD-10-CM | POA: Diagnosis present

## 2014-05-14 DIAGNOSIS — R55 Syncope and collapse: Secondary | ICD-10-CM | POA: Diagnosis not present

## 2014-05-14 DIAGNOSIS — S065X9A Traumatic subdural hemorrhage with loss of consciousness of unspecified duration, initial encounter: Secondary | ICD-10-CM

## 2014-05-14 DIAGNOSIS — R51 Headache: Secondary | ICD-10-CM

## 2014-05-14 DIAGNOSIS — K219 Gastro-esophageal reflux disease without esophagitis: Secondary | ICD-10-CM | POA: Diagnosis present

## 2014-05-14 DIAGNOSIS — S065XAA Traumatic subdural hemorrhage with loss of consciousness status unknown, initial encounter: Secondary | ICD-10-CM

## 2014-05-14 HISTORY — DX: Other chronic pain: G89.29

## 2014-05-14 HISTORY — DX: Periodic limb movement disorder: G47.61

## 2014-05-14 HISTORY — DX: Polyneuropathy, unspecified: G62.9

## 2014-05-14 HISTORY — DX: Headache, unspecified: R51.9

## 2014-05-14 HISTORY — DX: Unspecified dementia, unspecified severity, without behavioral disturbance, psychotic disturbance, mood disturbance, and anxiety: F03.90

## 2014-05-14 HISTORY — DX: Headache: R51

## 2014-05-14 LAB — CBC
HEMATOCRIT: 40.3 % (ref 36.0–46.0)
HEMOGLOBIN: 13.2 g/dL (ref 12.0–15.0)
MCH: 28.2 pg (ref 26.0–34.0)
MCHC: 32.8 g/dL (ref 30.0–36.0)
MCV: 86.1 fL (ref 78.0–100.0)
Platelets: 384 10*3/uL (ref 150–400)
RBC: 4.68 MIL/uL (ref 3.87–5.11)
RDW: 13.4 % (ref 11.5–15.5)
WBC: 12.7 10*3/uL — ABNORMAL HIGH (ref 4.0–10.5)

## 2014-05-14 LAB — URINALYSIS, ROUTINE W REFLEX MICROSCOPIC
Bilirubin Urine: NEGATIVE
GLUCOSE, UA: NEGATIVE mg/dL
Ketones, ur: NEGATIVE mg/dL
LEUKOCYTES UA: NEGATIVE
NITRITE: NEGATIVE
Protein, ur: NEGATIVE mg/dL
SPECIFIC GRAVITY, URINE: 1.014 (ref 1.005–1.030)
Urobilinogen, UA: 0.2 mg/dL (ref 0.0–1.0)
pH: 5 (ref 5.0–8.0)

## 2014-05-14 LAB — BASIC METABOLIC PANEL
ANION GAP: 12 (ref 5–15)
BUN: 20 mg/dL (ref 6–23)
CHLORIDE: 98 meq/L (ref 96–112)
CO2: 25 mEq/L (ref 19–32)
Calcium: 9.6 mg/dL (ref 8.4–10.5)
Creatinine, Ser: 0.89 mg/dL (ref 0.50–1.10)
GFR calc Af Amer: 75 mL/min — ABNORMAL LOW (ref >90)
GFR calc non Af Amer: 65 mL/min — ABNORMAL LOW (ref >90)
Glucose, Bld: 86 mg/dL (ref 70–99)
Potassium: 4.9 mEq/L (ref 3.7–5.3)
SODIUM: 135 meq/L — AB (ref 137–147)

## 2014-05-14 LAB — URINE MICROSCOPIC-ADD ON

## 2014-05-14 LAB — I-STAT TROPONIN, ED: Troponin i, poc: 0.01 ng/mL (ref 0.00–0.08)

## 2014-05-14 LAB — MRSA PCR SCREENING: MRSA by PCR: NEGATIVE

## 2014-05-14 MED ORDER — ALLOPURINOL 100 MG PO TABS
100.0000 mg | ORAL_TABLET | Freq: Every day | ORAL | Status: DC
  Administered 2014-05-15 – 2014-05-18 (×4): 100 mg via ORAL
  Filled 2014-05-14 (×4): qty 1

## 2014-05-14 MED ORDER — DULOXETINE HCL 60 MG PO CPEP
60.0000 mg | ORAL_CAPSULE | Freq: Every day | ORAL | Status: DC
  Administered 2014-05-15 – 2014-05-18 (×4): 60 mg via ORAL
  Filled 2014-05-14 (×4): qty 1

## 2014-05-14 MED ORDER — AMLODIPINE BESYLATE 5 MG PO TABS
5.0000 mg | ORAL_TABLET | Freq: Every day | ORAL | Status: DC
  Administered 2014-05-14 – 2014-05-18 (×5): 5 mg via ORAL
  Filled 2014-05-14 (×5): qty 1

## 2014-05-14 MED ORDER — PANTOPRAZOLE SODIUM 40 MG IV SOLR: 40.0000 mg | Freq: Every day | INTRAVENOUS | Status: DC

## 2014-05-14 MED ORDER — ACETAMINOPHEN 650 MG RE SUPP: 650.0000 mg | RECTAL | Status: DC | PRN

## 2014-05-14 MED ORDER — ACETAMINOPHEN 325 MG PO TABS
650.0000 mg | ORAL_TABLET | ORAL | Status: DC | PRN
  Administered 2014-05-15: 650 mg via ORAL
  Filled 2014-05-14: qty 2

## 2014-05-14 MED ORDER — ROPINIROLE HCL 1 MG PO TABS
2.0000 mg | ORAL_TABLET | Freq: Every day | ORAL | Status: DC
  Administered 2014-05-14 – 2014-05-17 (×4): 2 mg via ORAL
  Filled 2014-05-14 (×5): qty 2

## 2014-05-14 MED ORDER — STROKE: EARLY STAGES OF RECOVERY BOOK
Freq: Once | Status: DC
  Filled 2014-05-14: qty 1

## 2014-05-14 MED ORDER — POLYETHYLENE GLYCOL 3350 17 G PO PACK
17.0000 g | PACK | Freq: Every day | ORAL | Status: DC
  Administered 2014-05-15 – 2014-05-17 (×3): 17 g via ORAL
  Filled 2014-05-14 (×4): qty 1

## 2014-05-14 MED ORDER — FLUTICASONE PROPIONATE (INHAL) 100 MCG/BLIST IN AEPB: 1.0000 | INHALATION_SPRAY | Freq: Two times a day (BID) | RESPIRATORY_TRACT | Status: DC

## 2014-05-14 MED ORDER — CLONIDINE HCL 0.1 MG PO TABS
0.1000 mg | ORAL_TABLET | Freq: Every day | ORAL | Status: DC | PRN
  Filled 2014-05-14: qty 1

## 2014-05-14 MED ORDER — SODIUM CHLORIDE 0.9 % IV SOLN
INTRAVENOUS | Status: DC
  Administered 2014-05-14 – 2014-05-15 (×2): via INTRAVENOUS

## 2014-05-14 MED ORDER — IPRATROPIUM-ALBUTEROL 0.5-2.5 (3) MG/3ML IN SOLN
3.0000 mL | Freq: Four times a day (QID) | RESPIRATORY_TRACT | Status: DC
  Filled 2014-05-14: qty 3

## 2014-05-14 MED ORDER — CLINDAMYCIN HCL 300 MG PO CAPS: 300.0000 mg | ORAL_CAPSULE | Freq: Three times a day (TID) | ORAL | Status: DC

## 2014-05-14 MED ORDER — ACETAMINOPHEN 325 MG PO TABS: 650.0000 mg | ORAL_TABLET | ORAL | Status: DC | PRN

## 2014-05-14 MED ORDER — LOSARTAN POTASSIUM 50 MG PO TABS
50.0000 mg | ORAL_TABLET | Freq: Every day | ORAL | Status: DC
  Administered 2014-05-15 – 2014-05-18 (×4): 50 mg via ORAL
  Filled 2014-05-14 (×4): qty 1

## 2014-05-14 MED ORDER — MOMETASONE FURO-FORMOTEROL FUM 100-5 MCG/ACT IN AERO
2.0000 | INHALATION_SPRAY | Freq: Two times a day (BID) | RESPIRATORY_TRACT | Status: DC
  Administered 2014-05-14: 2 via RESPIRATORY_TRACT
  Filled 2014-05-14: qty 8.8

## 2014-05-14 MED ORDER — FLUTICASONE PROPIONATE HFA 110 MCG/ACT IN AERO
1.0000 | INHALATION_SPRAY | Freq: Two times a day (BID) | RESPIRATORY_TRACT | Status: DC
  Administered 2014-05-15 – 2014-05-18 (×6): 1 via RESPIRATORY_TRACT
  Filled 2014-05-14 (×2): qty 12

## 2014-05-14 MED ORDER — FAMOTIDINE 20 MG PO TABS
20.0000 mg | ORAL_TABLET | Freq: Every day | ORAL | Status: DC
  Administered 2014-05-15 – 2014-05-18 (×4): 20 mg via ORAL
  Filled 2014-05-14 (×4): qty 1

## 2014-05-14 MED ORDER — SENNOSIDES-DOCUSATE SODIUM 8.6-50 MG PO TABS: 1.0000 | ORAL_TABLET | Freq: Two times a day (BID) | ORAL | Status: DC

## 2014-05-14 MED ORDER — SENNOSIDES-DOCUSATE SODIUM 8.6-50 MG PO TABS
1.0000 | ORAL_TABLET | Freq: Two times a day (BID) | ORAL | Status: DC
  Administered 2014-05-14 – 2014-05-18 (×8): 1 via ORAL
  Filled 2014-05-14 (×9): qty 1

## 2014-05-14 MED ORDER — PANTOPRAZOLE SODIUM 40 MG IV SOLR
40.0000 mg | Freq: Every day | INTRAVENOUS | Status: DC
  Administered 2014-05-14: 40 mg via INTRAVENOUS
  Filled 2014-05-14 (×2): qty 40

## 2014-05-14 MED ORDER — DOXYCYCLINE HYCLATE 100 MG PO TABS: 100.0000 mg | ORAL_TABLET | Freq: Two times a day (BID) | ORAL | Status: DC

## 2014-05-14 MED ORDER — METOPROLOL SUCCINATE ER 100 MG PO TB24
100.0000 mg | ORAL_TABLET | Freq: Every day | ORAL | Status: DC
  Administered 2014-05-15 – 2014-05-18 (×4): 100 mg via ORAL
  Filled 2014-05-14 (×4): qty 1

## 2014-05-14 MED ORDER — LABETALOL HCL 5 MG/ML IV SOLN: 10.0000 mg | INTRAVENOUS | Status: DC | PRN

## 2014-05-14 MED ORDER — KETOROLAC TROMETHAMINE 15 MG/ML IJ SOLN
15.0000 mg | Freq: Once | INTRAMUSCULAR | Status: AC
  Administered 2014-05-14: 15 mg via INTRAVENOUS
  Filled 2014-05-14: qty 1

## 2014-05-14 MED ORDER — ROPINIROLE HCL 1 MG PO TABS: 2.0000 mg | ORAL_TABLET | Freq: Every day | ORAL | Status: DC

## 2014-05-14 NOTE — ED Notes (Signed)
From [facility]; ems reports she called out from bathroom floor stating she could not get up due to knee pain. Patient reports she was coughing and couldn't catch her breath, and tried to reach out for the sink, but couldn't grab onto it and fell. Reports left knee pain and cannot stand up on it.

## 2014-05-14 NOTE — Consult Note (Addendum)
Referring Physician: ED    Chief Complaint: left LE weakness  HPI:                                                                                                                                         Deborah [name] is an 68 y.o. female with multiple medical problems including HTN, hyperlipidemia, COPD, bipolar disorder, s/p drainage of a left cheek abscess on 9/2, brought to [facility] for further evaluation and management of left LE weakness. Patient is confused and said that she doesn't really knows what happened but has been unable to move the left leg and arm since earlier today.  As per ER notes, " She was having a "coughing attack" and she needed to use the restroom. Pt states that when she tried to get of from the toilet she fell. "nursing home reports that she stated at the time that she had a syncopal episode". Pt states that she then tried to get up but she couldn't move her left leg. Pt states that she doesn't feel it and she can't move it". CT brain revealed a 1.1 x 2.7 cm high right parafalcine hematoma with adjacent cerebral edema - question focal subdural hematoma vs intraparenchymal hemorrhage. Smaller 4 mm diffuse right parafalcine subdural hematoma. No evidence of Hydrocephalus. At this time,she is alert and awake, answers questions but is confused and complains of nausea and frontal HA. Denies double vision, speech changes, numbness, visual disturbances, CP, SOB, or palpitations. Neurosurgery has been consulted and they recommended no neurosurgical intervention but evaluation by neurology. Date last known well: 05/14/14 Time last known well: uncertain tPA Given: no, cerebral hemorrhage.  Past Medical History  Diagnosis Date  . Bipolar disorder     Lajuana Ripple, NP  . Depression   . Hyperlipidemia   . Hypertension   . Hiatal hernia   . Esophageal reflux   . Other specified disorders of liver   . Headache(784.0)   . Hepatomegaly   . Other chronic nonalcoholic liver disease    . Cardiomegaly   . Irritable bowel syndrome   . Diverticulosis of colon (without mention of hemorrhage)   . COPD (chronic obstructive pulmonary disease)   . Nausea   . Family history of colon cancer   . Gout flare     Left toe  . Dementia   . Chronic headache   . Peripheral neuropathy   . PLMD (periodic limb movement disorder)     Past Surgical History  Procedure Laterality Date  . Cholecystectomy    . Abdominal hysterectomy      partial  . Ovarian cyst removal    . Removal of cyst on chest wall    . Appendectomy      Family History  Problem Relation Age of Onset  . Emphysema Mother   . Heart disease Father   . Mental illness Father  Bipolar disorder  . Colon cancer Maternal Grandfather   . Alzheimer's disease Brother   . Bipolar disorder Brother    Social History:  reports that she has never smoked. She has never used smokeless tobacco. She reports that she does not drink alcohol or use illicit drugs.  Allergies:  Allergies  Allergen Reactions  . Dicyclomine Hcl     REACTION: anaphylactic reaction  . Omeprazole     REACTION: GI upset.  . Statins Other (See Comments)    myalgias    Medications:                                                                                                                        I have reviewed the patient's current medications.  ROS:                                                                                                                                       History obtained from chart review and patient  General ROS: negative for - chills, fatigue, fever, night sweats, or weight loss Psychological ROS: negative for - behavioral disorder, hallucinations, or suicidal ideation Ophthalmic ROS: negative for - blurry vision, double vision, eye pain or loss of vision ENT ROS: negative for - epistaxis, nasal discharge, oral lesions, sore throat, tinnitus or vertigo Allergy and Immunology ROS: negative for - hives or  itchy/watery eyes Hematological and Lymphatic ROS: negative for - bleeding problems, bruising or swollen lymph nodes Endocrine ROS: negative for - galactorrhea, hair pattern changes, polydipsia/polyuria or temperature intolerance Respiratory ROS: negative for - cough, hemoptysis, shortness of breath or wheezing Cardiovascular ROS: negative for - chest pain, dyspnea on exertion, edema or irregular heartbeat Gastrointestinal ROS: negative for - abdominal pain, diarrhea, hematemesis, nausea/vomiting or stool incontinence Genito-Urinary ROS: negative for - dysuria, hematuria, incontinence or urinary frequency/urgency Musculoskeletal ROS: negative for - joint swelling Neurological ROS: as noted in HPI Dermatological ROS: negative for rash and skin lesion changes  Physical exam: pleasantly confused female in no apparent distress. Blood pressure 139/84, pulse 88, temperature 98.1 F (36.7 C), temperature source Oral, resp. rate 17, SpO2 100.00%. Head: normocephalic. Neck: supple, no bruits, no JVD. Cardiac: no murmurs. Lungs: clear. Abdomen: soft, no tender, no mass. Extremities: bilateral pitting edema. Neurologic Examination:  General: Mental Status: Alert and awake but disoriented to place-year-month-date-situation.  Speech fluent without evidence of aphasia. Able to follow second order commands without difficulty. Cranial Nerves: II: Discs flat bilaterally; Visual fields grossly normal, pupils equal, round, reactive to light and accommodation III,IV, VI: ptosis not present, extra-ocular motions intact bilaterally V,VII: smile symmetric, facial light touch sensation normal bilaterally VIII: hearing normal bilaterally IX,X: gag reflex present XI: bilateral shoulder shrug XII: midline tongue extension without atrophy or fasciculations Motor: Significant for left hemiplegia and hemiparesis  left arm. Tone and bulk:normal tone throughout; no atrophy noted Sensory: Pinprick and light touch seems to be diminished in the left leg. Deep Tendon Reflexes:  Right: Upper Extremity   Left: Upper extremity   biceps (C-5 to C-6) 2/4   biceps (C-5 to C-6) 2/4 tricep (C7) 2/4    triceps (C7) 2/4 Brachioradialis (C6) 2/4  Brachioradialis (C6) 2/4  Lower Extremity Lower Extremity  quadriceps (L-2 to L-4) 2/4   quadriceps (L-2 to L-4) 2/4 Achilles (S1) 2/4   Achilles (S1) 2/4 Plantars: Right: downgoing   Left: upgoing Cerebellar: normal finger-to-nose,  normal heel-to-shin test in the right. FTN appropriate for the degree of weakness in the left but can not perform left HKS due to weakness. Gait:  Unable to test    Results for orders placed during the hospital encounter of 05/14/14 (from the past 48 hour(s))  CBC     Status: Abnormal   Collection Time    05/14/14 12:35 PM      Result Value Ref Range   WBC 12.7 (*) 4.0 - 10.5 K/uL   RBC 4.68  3.87 - 5.11 MIL/uL   Hemoglobin 13.2  12.0 - 15.0 g/dL   HCT 40.3  36.0 - 46.0 %   MCV 86.1  78.0 - 100.0 fL   MCH 28.2  26.0 - 34.0 pg   MCHC 32.8  30.0 - 36.0 g/dL   RDW 13.4  11.5 - 15.5 %   Platelets 384  150 - 400 K/uL  BASIC METABOLIC PANEL     Status: Abnormal   Collection Time    05/14/14 12:35 PM      Result Value Ref Range   Sodium 135 (*) 137 - 147 mEq/L   Potassium 4.9  3.7 - 5.3 mEq/L   Chloride 98  96 - 112 mEq/L   CO2 25  19 - 32 mEq/L   Glucose, Bld 86  70 - 99 mg/dL   BUN 20  6 - 23 mg/dL   Creatinine, Ser 0.89  0.50 - 1.10 mg/dL   Calcium 9.6  8.4 - 10.5 mg/dL   GFR calc non Af Amer 65 (*) >90 mL/min   GFR calc Af Amer 75 (*) >90 mL/min   Comment: (NOTE)     The eGFR has been calculated using the CKD EPI equation.     This calculation has not been validated in all clinical situations.     eGFR's persistently <90 mL/min signify possible Chronic Kidney     Disease.   Anion gap 12  5 - 15  I-STAT TROPOININ, ED      Status: None   Collection Time    05/14/14 12:45 PM      Result Value Ref Range   Troponin i, poc 0.01  0.00 - 0.08 ng/mL   Comment 3            Comment: Due to the release kinetics of cTnI,     a negative result  within the first hours     of the onset of symptoms does not rule out     myocardial infarction with certainty.     If myocardial infarction is still suspected,     repeat the test at appropriate intervals.  URINALYSIS, ROUTINE W REFLEX MICROSCOPIC     Status: Abnormal   Collection Time    05/14/14  1:32 PM      Result Value Ref Range   Color, Urine YELLOW  YELLOW   APPearance CLEAR  CLEAR   Specific Gravity, Urine 1.014  1.005 - 1.030   pH 5.0  5.0 - 8.0   Glucose, UA NEGATIVE  NEGATIVE mg/dL   Hgb urine dipstick TRACE (*) NEGATIVE   Bilirubin Urine NEGATIVE  NEGATIVE   Ketones, ur NEGATIVE  NEGATIVE mg/dL   Protein, ur NEGATIVE  NEGATIVE mg/dL   Urobilinogen, UA 0.2  0.0 - 1.0 mg/dL   Nitrite NEGATIVE  NEGATIVE   Leukocytes, UA NEGATIVE  NEGATIVE  URINE MICROSCOPIC-ADD ON     Status: None   Collection Time    05/14/14  1:32 PM      Result Value Ref Range   Squamous Epithelial / LPF RARE  RARE   WBC, UA 0-2  <3 WBC/hpf   RBC / HPF 0-2  <3 RBC/hpf   Dg Chest 2 View  05/14/2014   CLINICAL DATA:  Short of breath.  Cough.  EXAM: CHEST  2 VIEW  COMPARISON:  06/01/2013.  FINDINGS: Cardiopericardial silhouette within normal limits for projection. Chronic bronchitic changes at the RIGHT lung base. There is patchy opacity over the RIGHT hemidiaphragm on the frontal view, which projects over the RIGHT lower lobe on the lateral view. This could represent atelectasis or airspace disease. No effusion.  IMPRESSION: 1. New area of patchy opacity over the RIGHT hemidiaphragm. Atelectasis favored over airspace disease/pneumonia. 2. Chronic bronchitic changes.   Electronically Signed   By: Dereck Ligas M.D.   On: 05/14/2014 14:59   Ct Head Wo Contrast  05/14/2014   CLINICAL DATA:   68 year old female with fall and head injury.  EXAM: CT HEAD WITHOUT CONTRAST  TECHNIQUE: Contiguous axial images were obtained from the base of the skull through the vertex without intravenous contrast.  COMPARISON:  None.  FINDINGS: A 1.1 x 2.7 cm high right parafalcine hematoma with adjacent cerebral edema is noted which may represent a more focal subdural hematoma or possibly an intraparenchymal hemorrhage. A smaller more diffuse right parafalcine subdural hematoma measuring up to 4 mm is noted.  There is no evidence of hydrocephalus, midline shift or acute infarct.  Mild chronic small-vessel white matter ischemic changes are again noted.  Mucosal thickening in bilateral maxillary sinus is noted.  IMPRESSION: 1.1 x 2.7 cm high right parafalcine hematoma with adjacent cerebral edema - question focal subdural hematoma vs intraparenchymal hemorrhage. Smaller 4 mm diffuse right parafalcine subdural hematoma. No evidence of hydrocephalus.  Critical Value/emergent results were called by telephone at the time of interpretation on 05/14/2014 at 1:12 pm to [facility] , who verbally acknowledged these results.   Electronically Signed   By: Hassan Rowan M.D.   On: 05/14/2014 13:15    Assessment: 68 y.o. female with new onset left hemiparesis (actually plegic in the left LE) after apparently sustaining a fall/syncope at the nursing home where she resides. CT brain demonstrated 1.1 x 2.7 cm high right parafalcine hematoma with adjacent cerebral edema - question focal subdural hematoma vs intraparenchymal hemorrhage. Smaller 4 mm  diffuse right parafalcine subdural hematoma. Unclear if this is a large right ACA infarct with secondary hemorraghic transformation, as a primary ICH in this location will be unlikely. Rupture of an ACA aneurysm seems to be very unlikely. Will admit to NICU at least for tonight as she has obvious confusion and get MRI/MRA brain for further evaluation of the CT findings. Stroke team will follow  up in the morning.   Dorian Pod, MD [facility] [phone]  05/14/2014, 3:05 PM  Addendum: MRI brain reveals small interhemispheric subdural hematoma and right parietal hematoma measures 33 x 21 mm adjacent to the falx. This may represent a parenchymal hematoma which has ruptured into the subdural space. MRA brain showed no aneurysm.  Dorian Pod, MD

## 2014-05-14 NOTE — ED Notes (Signed)
Patient returned from CT

## 2014-05-14 NOTE — ED Notes (Signed)
Family now at bedside; reports patient is at baseline for confusion.

## 2014-05-14 NOTE — ED Notes (Signed)
MRI transport met this nurse in hallway on the way to [facility]; transport took over taking her to MRI. They will take to [facility] after testing. Joelene Millin on [address] called and made aware.

## 2014-05-14 NOTE — ED Notes (Signed)
Patient transported to X-ray 

## 2014-05-14 NOTE — ED Provider Notes (Signed)
CSN: [id number]     Arrival date & time 05/14/14  1122 History   First MD Initiated Contact with Patient 05/14/14 1142     Chief Complaint  Patient presents with  . Knee Pain     (Consider location/radiation/quality/duration/timing/severity/associated sxs/prior Treatment) HPI Comments: Pt states that she has had a lot of coughing for the last couple of days. She was having a "coughing attack" and she needed to use the restroom. Pt states that when she tried to get of from the toilet she fell. "nursing home reports that she stated at the time that she had a syncopal episode". Pt states that she then tried to get up but she couldn't move her left leg. Pt states that she doesn't feel it and she can't move it.  The history is provided by the patient. No language interpreter was used.    Past Medical History  Diagnosis Date  . Bipolar disorder     [name], NP  . Depression   . Hyperlipidemia   . Hypertension   . Hiatal hernia   . Esophageal reflux   . Other specified disorders of liver   . Headache(784.0)   . Hepatomegaly   . Other chronic nonalcoholic liver disease   . Cardiomegaly   . Irritable bowel syndrome   . Diverticulosis of colon (without mention of hemorrhage)   . COPD (chronic obstructive pulmonary disease)   . Nausea   . Family history of colon cancer   . Gout flare     Left toe  . Dementia   . Chronic headache   . Peripheral neuropathy   . PLMD (periodic limb movement disorder)    Past Surgical History  Procedure Laterality Date  . Cholecystectomy    . Abdominal hysterectomy      partial  . Ovarian cyst removal    . Removal of cyst on chest wall    . Appendectomy     Family History  Problem Relation Age of Onset  . Emphysema Mother   . Heart disease Father   . Mental illness Father     Bipolar disorder  . Colon cancer Maternal Grandfather   . Alzheimer's disease Brother   . Bipolar disorder Brother    History  Substance Use Topics  . Smoking  status: Never Smoker   . Smokeless tobacco: Never Used  . Alcohol Use: No   OB History   Grav Para Term Preterm Abortions TAB SAB Ect Mult Living                 Review of Systems    Allergies  Dicyclomine hcl; Omeprazole; and Statins  Home Medications   Prior to Admission medications   Medication Sig Start Date End Date Taking? Authorizing Provider  allopurinol (ZYLOPRIM) 100 MG tablet Take 100 mg by mouth daily.    Historical Provider, MD  amLODipine (NORVASC) 5 MG tablet Take 5 mg by mouth daily.    Historical Provider, MD  azelastine (ASTELIN) 137 MCG/SPRAY nasal spray Place 2 sprays into the nose 2 (two) times daily as needed for rhinitis. Use in each nostril as directed 12/20/11   Maurice March, MD  clindamycin (CLEOCIN) 300 MG capsule Take 300 mg by mouth every 8 (eight) hours. For 10 days.  Starting 05/03/14.    Historical Provider, MD  cloNIDine (CATAPRES) 0.1 MG tablet Take 0.1 mg by mouth daily as needed (SBP).    Historical Provider, MD  doxycycline (VIBRA-TABS) 100 MG tablet Take 1 tablet (  100 mg total) by mouth 2 (two) times daily. 05/06/14   Monica Becton, MD  DULoxetine (CYMBALTA) 60 MG capsule Take 60 mg by mouth daily.    Historical Provider, MD  EPINEPHrine (EPIPEN 2-PAK) 0.3 mg/0.3 mL DEVI Inject 0.3 mLs (0.3 mg total) into the muscle as needed. For allergic reaction to beesting 06/01/12   Agapito Games, MD  famotidine (PEPCID) 20 MG tablet Take 20 mg by mouth daily.    Historical Provider, MD  Fluticasone Propionate, Inhal, (FLOVENT DISKUS) 100 MCG/BLIST AEPB Inhale 1 puff into the lungs every 12 (twelve) hours.    Historical Provider, MD  HYDROcodone-acetaminophen (NORCO/VICODIN) 5-325 MG per tablet Take 1-2 tablets by mouth every 4 (four) hours as needed for moderate pain or severe pain. 05/04/14   Rolan Bucco, MD  ipratropium-albuterol (DUONEB) 0.5-2.5 (3) MG/3ML SOLN Take 3 mLs by nebulization every 6 (six) hours. For shortness of breath 07/05/11    Agapito Games, MD  LORazepam (ATIVAN) 0.5 MG tablet Take 0.5 mg by mouth every 8 (eight) hours.    Historical Provider, MD  losartan (COZAAR) 50 MG tablet Take 50 mg by mouth daily.    Historical Provider, MD  metoprolol succinate (TOPROL-XL) 100 MG 24 hr tablet Take 100 mg by mouth daily. Take with or immediately following a meal.    Historical Provider, MD  polyethylene glycol (MIRALAX / GLYCOLAX) packet Take 17 g by mouth daily.    Historical Provider, MD  rOPINIRole (REQUIP) 2 MG tablet Take 2 mg by mouth at bedtime.    Historical Provider, MD   BP 145/62  Pulse 82  Temp(Src) 98.1 F (36.7 C) (Oral)  Resp 19  SpO2 100% Physical Exam  Nursing note and vitals reviewed. Constitutional: She appears well-developed and well-nourished.  HENT:  Right Ear: External ear normal.  Left Ear: External ear normal.  Eyes: Conjunctivae are normal.  Cardiovascular: Normal rate and regular rhythm.   Pulmonary/Chest: Effort normal and breath sounds normal.  Abdominal: Soft. Bowel sounds are normal. There is no tenderness.  Musculoskeletal: Normal range of motion.  Neurological: She is alert.  Pt not moving left lower extremity. Oriented to self and place    ED Course  Procedures (including critical care time) Labs Review Labs Reviewed  CBC - Abnormal; Notable for the following:    WBC 12.7 (*)    All other components within normal limits  BASIC METABOLIC PANEL - Abnormal; Notable for the following:    Sodium 135 (*)    GFR calc non Af Amer 65 (*)    GFR calc Af Amer 75 (*)    All other components within normal limits  URINALYSIS, ROUTINE W REFLEX MICROSCOPIC - Abnormal; Notable for the following:    Hgb urine dipstick TRACE (*)    All other components within normal limits  URINE MICROSCOPIC-ADD ON  Rosezena Sensor, ED    Imaging Review Ct Head Wo Contrast  05/14/2014   CLINICAL DATA:  68 year old female with fall and head injury.  EXAM: CT HEAD WITHOUT CONTRAST  TECHNIQUE:  Contiguous axial images were obtained from the base of the skull through the vertex without intravenous contrast.  COMPARISON:  None.  FINDINGS: A 1.1 x 2.7 cm high right parafalcine hematoma with adjacent cerebral edema is noted which may represent a more focal subdural hematoma or possibly an intraparenchymal hemorrhage. A smaller more diffuse right parafalcine subdural hematoma measuring up to 4 mm is noted.  There is no evidence of hydrocephalus, midline shift  or acute infarct.  Mild chronic small-vessel white matter ischemic changes are again noted.  Mucosal thickening in bilateral maxillary sinus is noted.  IMPRESSION: 1.1 x 2.7 cm high right parafalcine hematoma with adjacent cerebral edema - question focal subdural hematoma vs intraparenchymal hemorrhage. Smaller 4 mm diffuse right parafalcine subdural hematoma. No evidence of hydrocephalus.  Critical Value/emergent results were called by telephone at the time of interpretation on 05/14/2014 at 1:12 pm to [facility] , who verbally acknowledged these results.   Electronically Signed   By: Laveda Abbe M.D.   On: 05/14/2014 13:15     EKG Interpretation   Date/Time:  Saturday May 14 2014 11:47:24 EDT Ventricular Rate:  85 PR Interval:  149 QRS Duration: 84 QT Interval:  358 QTC Calculation: 426 R Axis:   69 Text Interpretation:  Sinus rhythm Borderline T abnormalities, anterior  leads When compared with ECG of 06/03/2013 No significant change was found  Confirmed by [facility]  MD, Nicholos Johns [id number]) on 05/14/2014 11:58:17 AM      MDM   Final diagnoses:  Hemorrhagic stroke  Subdural hematoma    Spoke with Dr. Yetta Barre with neurosurgery that he thinks pt likely had a stroke causing the syncope. He asked for neurology to admit the pt    Teressa Lower, NP 05/14/14 1438

## 2014-05-14 NOTE — ED Notes (Addendum)
Undocument triage start and EMS vitals of 1204 and 1205; Wrong patient.

## 2014-05-14 NOTE — ED Notes (Signed)
Patient returned from X-ray 

## 2014-05-15 ENCOUNTER — Inpatient Hospital Stay (HOSPITAL_COMMUNITY): Payer: Medicare Other

## 2014-05-15 DIAGNOSIS — F319 Bipolar disorder, unspecified: Secondary | ICD-10-CM

## 2014-05-15 DIAGNOSIS — I6200 Nontraumatic subdural hemorrhage, unspecified: Secondary | ICD-10-CM

## 2014-05-15 MED ORDER — IPRATROPIUM-ALBUTEROL 0.5-2.5 (3) MG/3ML IN SOLN
3.0000 mL | Freq: Four times a day (QID) | RESPIRATORY_TRACT | Status: DC | PRN
  Administered 2014-05-15: 3 mL via RESPIRATORY_TRACT
  Filled 2014-05-15: qty 3

## 2014-05-15 MED ORDER — LABETALOL HCL 5 MG/ML IV SOLN: 10.0000 mg | INTRAVENOUS | Status: DC | PRN

## 2014-05-15 MED ORDER — FLUCONAZOLE 150 MG PO TABS
150.0000 mg | ORAL_TABLET | Freq: Once | ORAL | Status: AC
  Administered 2014-05-15: 150 mg via ORAL
  Filled 2014-05-15: qty 1

## 2014-05-15 MED ORDER — HEPARIN SODIUM (PORCINE) 5000 UNIT/ML IJ SOLN
5000.0000 [IU] | Freq: Three times a day (TID) | INTRAMUSCULAR | Status: DC
  Administered 2014-05-16 – 2014-05-18 (×8): 5000 [IU] via SUBCUTANEOUS
  Filled 2014-05-15 (×8): qty 1

## 2014-05-15 NOTE — Progress Notes (Signed)
STROKE TEAM PROGRESS NOTE   HISTORY Deborah [name] is an 68 y.o. female with multiple medical problems including HTN, hyperlipidemia, COPD, bipolar disorder, s/p drainage of a left cheek abscess on 9/2, brought to [facility] for further evaluation and management of left LE weakness.  Patient is confused and said that she doesn't really knows what happened but has been unable to move the left leg and arm since earlier today.  As per ER notes, " She was having a "coughing attack" and she needed to use the restroom. Pt states that when she tried to get of from the toilet she fell. "nursing home reports that she stated at the time that she had a syncopal episode". Pt states that she then tried to get up but she couldn't move her left leg. Pt states that she doesn't feel it and she can't move it".  CT brain revealed a 1.1 x 2.7 cm high right parafalcine hematoma with adjacent  cerebral edema - question focal subdural hematoma vs intraparenchymal hemorrhage. Smaller 4 mm diffuse right parafalcine subdural hematoma. No evidence of  Hydrocephalus.  At this time,she is alert and awake, answers questions but is confused and complains of nausea and frontal HA. Denies double vision, speech changes, numbness, visual disturbances, CP, SOB, or palpitations.  Neurosurgery has been consulted and they recommended no neurosurgical intervention but evaluation by neurology.  Date last known well: 05/14/14  Time last known well: uncertain  tPA Given: no, cerebral hemorrhage.  SUBJECTIVE (INTERVAL HISTORY) No family is at the bedside.  Overall she feels her condition is unchanged. Repeat CT in am showed stable hematoma and SDH. Transfer to floor today.    OBJECTIVE Temp:  [97.7 F (36.5 C)-98.9 F (37.2 C)] 98.7 F (37.1 C) (09/13 1811) Pulse Rate:  [85-102] 85 (09/13 1811) Cardiac Rhythm:  [-] Normal sinus rhythm (09/13 0800) Resp:  [16-21] 18 (09/13 1811) BP: (94-147)/(37-83) 94/37 mmHg (09/13 1811) SpO2:  [96  %-100 %] 96 % (09/13 1819)  No results found for this basename: GLUCAP,  in the last 168 hours  Recent Labs Lab 05/14/14 1235  NA 135*  K 4.9  CL 98  CO2 25  GLUCOSE 86  BUN 20  CREATININE 0.89  CALCIUM 9.6   No results found for this basename: AST, ALT, ALKPHOS, BILITOT, PROT, ALBUMIN,  in the last 168 hours  Recent Labs Lab 05/14/14 1235  WBC 12.7*  HGB 13.2  HCT 40.3  MCV 86.1  PLT 384   No results found for this basename: CKTOTAL, CKMB, CKMBINDEX, TROPONINI,  in the last 168 hours No results found for this basename: LABPROT, INR,  in the last 72 hours  Recent Labs  05/14/14 1332  COLORURINE YELLOW  LABSPEC 1.014  PHURINE 5.0  GLUCOSEU NEGATIVE  HGBUR TRACE*  BILIRUBINUR NEGATIVE  KETONESUR NEGATIVE  PROTEINUR NEGATIVE  UROBILINOGEN 0.2  NITRITE NEGATIVE  LEUKOCYTESUR NEGATIVE       Component Value Date/Time   CHOL 349* 03/11/2012 1200   TRIG 258* 03/11/2012 1200   HDL 63 03/11/2012 1200   CHOLHDL 5.5 03/11/2012 1200   VLDL 52* 03/11/2012 1200   LDLCALC 234* 03/11/2012 1200   Lab Results  Component Value Date   HGBA1C 5.6 03/11/2012      Component Value Date/Time   LABOPIA NONE DETECTED 06/03/2013 0036   COCAINSCRNUR NONE DETECTED 06/03/2013 0036   LABBENZ NONE DETECTED 06/03/2013 0036   AMPHETMU NONE DETECTED 06/03/2013 0036   THCU NONE DETECTED 06/03/2013 0036  LABBARB NONE DETECTED 06/03/2013 0036    No results found for this basename: ETH,  in the last 168 hours  Dg Chest 2 View  05/14/2014   IMPRESSION: 1. New area of patchy opacity over the RIGHT hemidiaphragm. Atelectasis favored over airspace disease/pneumonia. 2. Chronic bronchitic changes.    Ct Head Wo Contrast  05/15/2014   IMPRESSION: 1. No significant interval change in size of right parafalcine hematoma, measuring 21 x 35 mm. 2. Stable right parafalcine subdural hemorrhage measuring 4 mm in maximal diameter. No midline shift. No hydrocephalus.      Ct Head Wo Contrast  05/14/2014    IMPRESSION: 1.1 x 2.7 cm high right parafalcine hematoma with adjacent cerebral edema - question focal subdural hematoma vs intraparenchymal hemorrhage. Smaller 4 mm diffuse right parafalcine subdural hematoma. No evidence of hydrocephalus.   Mri and Mra Head Wo Contrast  05/14/2014   IMPRESSION: Small interhemispheric subdural hematoma. Right parietal hematoma measures 33 x 21 mm adjacent to the falx. This may represent a parenchymal hematoma which has ruptured into the subdural space.  Negative for acute ischemic infarction. No underlying mass lesion is present on unenhanced images  MRA image quality degraded by motion. Distal right vertebral artery not visualized and may be occluded or and in PICA. There is diffuse disease in the basilar.      2D echo - pending  Carotid dippler - pending   PHYSICAL EXAM  Temp:  [97.7 F (36.5 C)-99.2 F (37.3 C)] 99.2 F (37.3 C) (09/13 2116) Pulse Rate:  [85-104] 104 (09/13 2116) Resp:  [16-21] 16 (09/13 2116) BP: (94-170)/(37-96) 170/96 mmHg (09/13 2116) SpO2:  [96 %-100 %] 96 % (09/13 2116)  General - Well nourished, well developed, in no apparent distress.  Ophthalmologic - Sharp disc margins OU.  Cardiovascular - Regular rate and rhythm with no murmur.  Mental Status -  Awake alert and orientated to people but to time and place.  Language including expression, naming, repetition, comprehension was assessed and found intact. Pt was talkative, however, presented with racing thoughts and diminished attention and concentration. Constant changing topics with perseveration. Delayed recall 0/3.   Cranial Nerves II - XII - II - Vision intact OU. III, IV, VI - Extraocular movements intact. V - Facial sensation intact bilaterally. VII - Facial movement intact bilaterally. VIII - Hearing & vestibular intact bilaterally. X - Palate elevates symmetrically. XI - Chin turning & shoulder shrug intact bilaterally. XII - Tongue protrusion  intact.  Motor Strength - The patient's strength was 2/5 LUE and 0/5 LLE, trace withdraw on LLE on pain stimulation.  Bulk was normal and fasciculations were absent.   Motor Tone - Muscle tone was assessed at the neck and appendages and was normal.  Reflexes - The patient's reflexes were 1+ LUE and LLE, 2+ RUE and RLE and she had no pathological reflexes.  Sensory - Light touch, temperature/pinprick were assessed and were decreased on the left.    Coordination - The patient had normal movements in right hand with no ataxia or dysmetria.  Tremor was absent.  Gait and Station - not tested.   ASSESSMENT/PLAN  Ms. VINAYA SANCHO is a 68 y.o. female with history of HTN, HLD, dementia, bipolar, s/p drainage of a left cheek abscess on 9/2 admitted for LLE weakness. CT and MRI showed right frontal high convexity ICH with parafalx SDH. Considering her long standing dementia and cortical bleeding, CAA is on the top of the DDx, but did not showed significant  micorbleedings. . Of course, HTN bleeds also on the DDx.  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 There is diffuse disease in the basilar.  Carotid Doppler  pending  2D Echo  pending   LDL pending  HgbA1c pendingL  SCDs and heparin subq for VTE prophylaxis  Cardiac .   Activity as tolerated  Resultant   Therapy needs:  CIR  Ongoing aggressive risk factor management  Risk factor education  Patient counseled to be compliant with her antithrombotic medications  Disposition:  CIR  Hypertension   Home meds:  Norvasc, losartan and metoprolol. Resumed in hospital  BP 110-150 past 24h (05/15/2014 @ 9:10 PM)  SBP goal <160  Stable  Patient counseled to be compliant with her  blood pressure medications  Hyperlipidemia  Home meds:  none.   LDL pending  LDL goal < 100  Dementia - diagnosed in the past - dementia  Work up  - not on aricept / nemanda at home  Other Stroke Risk Factors Advanced age Cigarette smoker, advised to stop smoking   Dementia   Coronary artery disease  Other Active Problems  Left cheek abscess s/p drainage on 05/04/14  Hospital day # 1   Marvel Plan, MD PhD Stroke Neurology 05/15/2014 9:42 PM      To contact Stroke Continuity provider, please refer to [email or web address]. After hours, contact General Neurology

## 2014-05-15 NOTE — Progress Notes (Signed)
SLP Note  Patient Details Name: Deborah [name] MRN: [id number] DOB: 09/07/1945   Orders received for BSE/SLE. Will complete evaluation as able. Thank you for this referral.  Whittney Steenson B. Trout, [facility], CCC-SLP [phone]  Leigh Aurora 05/15/2014, 1:42 PM

## 2014-05-15 NOTE — Evaluation (Signed)
Physical Therapy Evaluation Patient Details Name: Deborah [name] MRN: [id number] DOB: 1945-09-30 Today's Date: 05/15/2014   History of Present Illness   68 y.o. female with multiple medical problems including HTN, hyperlipidemia, COPD, bipolar disorder, s/p drainage of a left cheek abscess on 9/2, brought to [facility] for further evaluation and management of left LE weakness.  Patient is confused and said that she doesn't really knows what happened but has been unable to move the left leg and arm since earlier today. As per ER notes, " She was having a "coughing attack" and she needed to use the restroom. Pt states that when she tried to get of from the toilet she fell. "nursing home reports that she stated at the time that she had a syncopal episode". Pt states that she then tried to get up but she couldn't move her left leg. Pt states that she doesn't feel it and she can't move it". CT brain revealed a 1.1 x 2.7 cm high right parafalcine hematoma with adjacent cerebral edema - question focal subdural hematoma vs intraparenchymal hemorrhage. Smaller 4 mm diffuse right parafalcine subdural hematoma. No evidence of Hydrocephalus.  At this time,she is alert and awake, answers questions but is confused and complains of nausea and frontal HA. Denies double vision, speech changes, numbness, visual disturbances, CP, SOB, or palpitations.    Clinical Impression  Pt presents with significant mobility limitations related to left side hemiparesis/loss of sensation characterized by extensor tone, complicated by cognitive deficits and confusion.  Recommend CIR consult (ordered) to assess for potential admission, commence acute-level PT, and verify prior level of physical function with family or ALF.  Will benefit from PT for deficits as described below.    Follow Up Recommendations CIR    Equipment Recommendations  Other (comment) (TBD)    Recommendations for Other Services Rehab consult     Precautions /  Restrictions        Mobility  Bed Mobility Overal bed mobility: Needs Assistance Bed Mobility: Rolling;Supine to Sit;Sit to Supine Rolling: Mod assist   Supine to sit: Mod assist Sit to supine: Max assist   General bed mobility comments: physical assist to repostion left side and roll to/from side  Transfers Overall transfer level: Needs assistance Equipment used: 1 person hand held assist Transfers: Sit to/from Stand Sit to Stand: Mod assist         General transfer comment: close front guard position, attempted to lift and shift hips to right/HOB and pt able to come to full standing with incr tone LEFT leg able to support weight when maximally shifted to left and right leg advanced to right  Ambulation/Gait             General Gait Details: unable to test  Stairs            Wheelchair Mobility    Modified Rankin (Stroke Patients Only) Modified Rankin (Stroke Patients Only) Pre-Morbid Rankin Score:  (premorbid status unclear, must verify w/family or ALF) Modified Rankin: Severe disability     Balance Overall balance assessment: Needs assistance Sitting-balance support: Single extremity supported;Feet supported;Feet unsupported Sitting balance-Leahy Scale: Poor Sitting balance - Comments: leans posterior, able to maintain neutral sitting posture for short periods Postural control: Posterior lean Standing balance support: During functional activity Standing balance-Leahy Scale: Zero Standing balance comment: requires total assist  Pertinent Vitals/Pain Pain Assessment: Faces Faces Pain Scale: Hurts little more Pain Location: left leg/arm with some ROM but inconsistently reports/presents Pain Intervention(s): Limited activity within patient's tolerance;Repositioned (did place right arm on pillow and rolled cloth in hand)    Home Living Family/patient expects to be discharged to:: Assisted living                Home Equipment: Other (comment) Additional Comments: pt unreliable historian    Prior Function Level of Independence: Needs assistance   Gait / Transfers Assistance Needed: unclear, need to confirm with ALF           Hand Dominance        Extremity/Trunk Assessment   Upper Extremity Assessment: Defer to OT evaluation;LUE deficits/detail       LUE Deficits / Details: increased tone in left arm with extensor synergy/position; placed cloth in hand to promote extension   Lower Extremity Assessment: LLE deficits/detail   LLE Deficits / Details: extensor tone noted throughout with pain reported with attempt at calf stretching     Communication   Communication: Expressive difficulties (confabulation and word finding (bread=Football))  Cognition Arousal/Alertness: Awake/alert Behavior During Therapy: WFL for tasks assessed/performed;Impulsive Overall Cognitive Status: History of cognitive impairments - at baseline       Memory: Decreased short-term memory              General Comments      Exercises        Assessment/Plan    PT Assessment Patient needs continued PT services  PT Diagnosis Hemiplegia dominant side   PT Problem List Pain;Obesity;Impaired sensation;Decreased knowledge of precautions;Decreased safety awareness;Decreased knowledge of use of DME;Decreased cognition;Decreased mobility;Decreased coordination;Decreased range of motion;Decreased balance;Decreased strength  PT Treatment Interventions Manual techniques;Patient/family education;Cognitive remediation;Neuromuscular re-education;Balance training;Therapeutic exercise;Therapeutic activities;Functional mobility training;Gait training;DME instruction   PT Goals (Current goals can be found in the Care Plan section) Acute Rehab PT Goals PT Goal Formulation: Patient unable to participate in goal setting Time For Goal Achievement: 05/29/14 Potential to Achieve Goals: Fair    Frequency Min  4X/week   Barriers to discharge Other (comment) (from ALF, expect plan to return as able)      Co-evaluation               End of Session   Activity Tolerance: Patient limited by pain;Patient tolerated treatment well Patient left: in bed;with bed alarm set;with nursing/sitter in room Nurse Communication: Mobility status         Time: 1326-1400 PT Time Calculation (min): 34 min   Charges:   PT Evaluation $Initial PT Evaluation Tier I: 1 Procedure PT Treatments $Therapeutic Activity: 23-37 mins   PT G Codes:          Dennis Bast 05/15/2014, 2:07 PM

## 2014-05-15 NOTE — Plan of Care (Signed)
Problem: Progression Outcomes Goal: Hemodynamically stable Outcome: Completed/Met Date Met:  05/15/14 9/13 - Pt remains hemodynamically stable. CT scan from this AM shows stable bleed. Blood pressure, respiratory rate and pulse remain within prescribed limits without PRN medication. Pt has remained stable throughout the shift.      

## 2014-05-15 NOTE — Progress Notes (Signed)
Utilization review completed.  

## 2014-05-16 ENCOUNTER — Inpatient Hospital Stay (HOSPITAL_COMMUNITY): Payer: Medicare Other

## 2014-05-16 DIAGNOSIS — R51 Headache: Secondary | ICD-10-CM

## 2014-05-16 DIAGNOSIS — F0390 Unspecified dementia without behavioral disturbance: Secondary | ICD-10-CM

## 2014-05-16 DIAGNOSIS — I619 Nontraumatic intracerebral hemorrhage, unspecified: Secondary | ICD-10-CM

## 2014-05-16 DIAGNOSIS — I517 Cardiomegaly: Secondary | ICD-10-CM

## 2014-05-16 LAB — LIPID PANEL
Cholesterol: 269 mg/dL — ABNORMAL HIGH (ref 0–200)
HDL: 53 mg/dL (ref >39)
LDL CALC: 184 mg/dL — AB (ref 0–99)
Total CHOL/HDL Ratio: 5.1 RATIO
Triglycerides: 161 mg/dL — ABNORMAL HIGH (ref <150)
VLDL: 32 mg/dL (ref 0–40)

## 2014-05-16 LAB — BASIC METABOLIC PANEL
ANION GAP: 15 (ref 5–15)
BUN: 14 mg/dL (ref 6–23)
CO2: 23 mEq/L (ref 19–32)
Calcium: 9.7 mg/dL (ref 8.4–10.5)
Chloride: 97 mEq/L (ref 96–112)
Creatinine, Ser: 0.74 mg/dL (ref 0.50–1.10)
GFR calc Af Amer: >90 mL/min (ref >90)
GFR, EST NON AFRICAN AMERICAN: 85 mL/min — AB (ref >90)
Glucose, Bld: 115 mg/dL — ABNORMAL HIGH (ref 70–99)
Potassium: 4.3 mEq/L (ref 3.7–5.3)
SODIUM: 135 meq/L — AB (ref 137–147)

## 2014-05-16 LAB — T4, FREE: Free T4: 1.01 ng/dL (ref 0.80–1.80)

## 2014-05-16 LAB — CBC
HCT: 37.6 % (ref 36.0–46.0)
Hemoglobin: 12.6 g/dL (ref 12.0–15.0)
MCH: 28.8 pg (ref 26.0–34.0)
MCHC: 33.5 g/dL (ref 30.0–36.0)
MCV: 86 fL (ref 78.0–100.0)
PLATELETS: 365 10*3/uL (ref 150–400)
RBC: 4.37 MIL/uL (ref 3.87–5.11)
RDW: 13.4 % (ref 11.5–15.5)
WBC: 8.7 10*3/uL (ref 4.0–10.5)

## 2014-05-16 LAB — RPR

## 2014-05-16 LAB — HEMOGLOBIN A1C
Hgb A1c MFr Bld: 6.1 % — ABNORMAL HIGH (ref <5.7)
Mean Plasma Glucose: 128 mg/dL — ABNORMAL HIGH (ref <117)

## 2014-05-16 LAB — FOLATE: Folate: >20 ng/mL

## 2014-05-16 LAB — TSH: TSH: 1.45 u[IU]/mL (ref 0.350–4.500)

## 2014-05-16 LAB — VITAMIN B12: Vitamin B-12: 398 pg/mL (ref 211–911)

## 2014-05-16 MED ORDER — CYANOCOBALAMIN 1000 MCG/ML IJ SOLN
1000.0000 ug | Freq: Once | INTRAMUSCULAR | Status: AC
  Administered 2014-05-17: 1000 ug via INTRAMUSCULAR
  Filled 2014-05-16 (×2): qty 1

## 2014-05-16 MED ORDER — DONEPEZIL HCL 10 MG PO TABS
10.0000 mg | ORAL_TABLET | Freq: Every day | ORAL | Status: DC
  Administered 2014-05-16 – 2014-05-17 (×2): 10 mg via ORAL
  Filled 2014-05-16 (×2): qty 1

## 2014-05-16 NOTE — Progress Notes (Signed)
STROKE TEAM PROGRESS NOTE   HISTORY Deborah [name] is an 68 y.o. female with multiple medical problems including HTN, hyperlipidemia, COPD, bipolar disorder, s/p drainage of a left cheek abscess on 9/2, brought to [facility] for further evaluation and management of left LE weakness.  Patient is confused and said that she doesn't really knows what happened but has been unable to move the left leg and arm since earlier today.  As per ER notes, " She was having a "coughing attack" and she needed to use the restroom. Pt states that when she tried to get of from the toilet she fell. "nursing home reports that she stated at the time that she had a syncopal episode". Pt states that she then tried to get up but she couldn't move her left leg. Pt states that she doesn't feel it and she can't move it".  CT brain revealed a 1.1 x 2.7 cm high right parafalcine hematoma with adjacent  cerebral edema - question focal subdural hematoma vs intraparenchymal hemorrhage. Smaller 4 mm diffuse right parafalcine subdural hematoma. No evidence of  Hydrocephalus.  At this time,she is alert and awake, answers questions but is confused and complains of nausea and frontal HA. Denies double vision, speech changes, numbness, visual disturbances, CP, SOB, or palpitations.  Neurosurgery has been consulted and they recommended no neurosurgical intervention but evaluation by neurology.  Date last known well: 05/14/14  Time last known well: uncertain  tPA Given: no, cerebral hemorrhage.  SUBJECTIVE (INTERVAL HISTORY) No family is at the bedside.  Overall she feels her condition is stable. She is still demented with very short attention span and poor memory and racing thoughts. OT evaluation suggested SNF.     OBJECTIVE Temp:  [97.8 F (36.6 C)-98.5 F (36.9 C)] 98.2 F (36.8 C) (09/14 2031) Pulse Rate:  [76-99] 78 (09/14 2031) Cardiac Rhythm:  [-]  Resp:  [16-19] 19 (09/14 2031) BP: (111-170)/(57-88) 111/57 mmHg (09/14  2031) SpO2:  [95 %-99 %] 99 % (09/14 2031)  No results found for this basename: GLUCAP,  in the last 168 hours  Recent Labs Lab 05/14/14 1235 05/16/14 0903  NA 135* 135*  K 4.9 4.3  CL 98 97  CO2 25 23  GLUCOSE 86 115*  BUN 20 14  CREATININE 0.89 0.74  CALCIUM 9.6 9.7   No results found for this basename: AST, ALT, ALKPHOS, BILITOT, PROT, ALBUMIN,  in the last 168 hours  Recent Labs Lab 05/14/14 1235 05/16/14 0903  WBC 12.7* 8.7  HGB 13.2 12.6  HCT 40.3 37.6  MCV 86.1 86.0  PLT 384 365   No results found for this basename: CKTOTAL, CKMB, CKMBINDEX, TROPONINI,  in the last 168 hours No results found for this basename: LABPROT, INR,  in the last 72 hours  Recent Labs  05/14/14 1332  COLORURINE YELLOW  LABSPEC 1.014  PHURINE 5.0  GLUCOSEU NEGATIVE  HGBUR TRACE*  BILIRUBINUR NEGATIVE  KETONESUR NEGATIVE  PROTEINUR NEGATIVE  UROBILINOGEN 0.2  NITRITE NEGATIVE  LEUKOCYTESUR NEGATIVE       Component Value Date/Time   CHOL 269* 05/16/2014 0903   TRIG 161* 05/16/2014 0903   HDL 53 05/16/2014 0903   CHOLHDL 5.1 05/16/2014 0903   VLDL 32 05/16/2014 0903   LDLCALC 184* 05/16/2014 0903   Lab Results  Component Value Date   HGBA1C 6.1* 05/16/2014      Component Value Date/Time   LABOPIA NONE DETECTED 06/03/2013 0036   COCAINSCRNUR NONE DETECTED 06/03/2013 0036   LABBENZ  NONE DETECTED 06/03/2013 0036   AMPHETMU NONE DETECTED 06/03/2013 0036   THCU NONE DETECTED 06/03/2013 0036   LABBARB NONE DETECTED 06/03/2013 0036    No results found for this basename: ETH,  in the last 168 hours  Dg Chest 2 View  05/14/2014   IMPRESSION: 1. New area of patchy opacity over the RIGHT hemidiaphragm. Atelectasis favored over airspace disease/pneumonia. 2. Chronic bronchitic changes.    Ct Head Wo Contrast  05/15/2014   IMPRESSION: 1. No significant interval change in size of right parafalcine hematoma, measuring 21 x 35 mm. 2. Stable right parafalcine subdural hemorrhage measuring 4 mm  in maximal diameter. No midline shift. No hydrocephalus.      Ct Head Wo Contrast  05/14/2014   IMPRESSION: 1.1 x 2.7 cm high right parafalcine hematoma with adjacent cerebral edema - question focal subdural hematoma vs intraparenchymal hemorrhage. Smaller 4 mm diffuse right parafalcine subdural hematoma. No evidence of hydrocephalus.   Mri and Mra Head Wo Contrast  05/14/2014   IMPRESSION: Small interhemispheric subdural hematoma. Right parietal hematoma measures 33 x 21 mm adjacent to the falx. This may represent a parenchymal hematoma which has ruptured into the subdural space.  Negative for acute ischemic infarction. No underlying mass lesion is present on unenhanced images  MRA image quality degraded by motion. Distal right vertebral artery not visualized and may be occluded or and in PICA. There is diffuse disease in the basilar.      2D echo - - Left ventricle: The cavity size was normal. There was mild concentric hypertrophy. Systolic function was normal. The estimated ejection fraction was in the range of 60% to 65%. Wall motion was normal; there were no regional wall motion abnormalities. Doppler parameters are consistent with abnormal left ventricular relaxation (grade 1 diastolic dysfunction). The E/e&' ratio is >15, suggesting elevated LV filling pressure. - Mitral valve: Calcified annulus. There was trivial regurgitation. - Left atrium: Severely dilated (27 cm2). - Atrial septum: A patent foramen ovale cannot be excluded. - Inferior vena cava: The vessel was normal in size. The respirophasic diameter changes were in the normal range (>= 50%), consistent with normal central venous pressure. Impressions: - LVEF 60-65%, mild concentric LVH, diastolic dysfunction, elevated LV filling pressure, severe LAE.   Carotid dippler - Bilateral: 1-39% ICA stenosis. Vertebral artery flow is antegrade.  EEG - This awake and asleep EEG is abnormal due to moderate diffuse  slowing of the  waking background. Clinical Correlation of the above findings indicates diffuse  cerebral dysfunction that is non-specific in etiology and can be  seen with hypoxic/ischemic injury, toxic/metabolic  encephalopathies, neurodegenerative disorders, or medication  effect.    PHYSICAL EXAM  Temp:  [97.8 F (36.6 C)-98.5 F (36.9 C)] 98.2 F (36.8 C) (09/14 2031) Pulse Rate:  [76-99] 78 (09/14 2031) Resp:  [16-19] 19 (09/14 2031) BP: (111-170)/(57-88) 111/57 mmHg (09/14 2031) SpO2:  [95 %-99 %] 99 % (09/14 2031)  General - Well nourished, well developed, in no apparent distress.  Ophthalmologic - Sharp disc margins OU.  Cardiovascular - Regular rate and rhythm with no murmur.  Mental Status -  Awake alert and orientated to people but to time and place.  Language including expression, naming, repetition, comprehension was assessed and found intact. Pt was talkative, however, presented with racing thoughts and diminished attention span and concentration. Constant changing topics with perseveration. Delayed recall 0/3.   Cranial Nerves II - XII - II - Vision intact OU. III, IV, VI - Extraocular movements  intact. V - Facial sensation intact bilaterally. VII - Facial movement intact bilaterally. VIII - Hearing & vestibular intact bilaterally. X - Palate elevates symmetrically. XI - Chin turning & shoulder shrug intact bilaterally. XII - Tongue protrusion intact.  Motor Strength - The patient's strength was 1/5 LUE and 0/5 LLE, trace withdraw on LLE on pain stimulation.  Bulk was normal and fasciculations were absent.   Motor Tone - Muscle tone was assessed at the neck and appendages and was normal.  Reflexes - The patient's reflexes were 1+ LUE and LLE, 2+ RUE and RLE and she had no pathological reflexes.  Sensory - Light touch, temperature/pinprick were assessed and were decreased on the left.    Coordination - The patient had normal movements in right hand with no ataxia or  dysmetria.  Tremor was absent.  Gait and Station - not tested.   ASSESSMENT/PLAN  Ms. Deborah [name] is a 67 y.o. female with history of HTN, HLD, dementia, bipolar, s/p drainage of a left cheek abscess on 9/2 admitted for LLE weakness. CT and MRI showed right frontal high convexity ICH with parafalx SDH. Considering her long standing dementia and cortical bleeding, CAA is on the top of the DDx, but did not showed significant micorbleedings. . Of course, HTN bleeds also on the DDx.  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 There is diffuse disease in the basilar.  Carotid Doppler unremarkable  2D Echo unremarkable   LDL 184 - on statin in the past but not tolerating due to myalgias. Will add fenofibrate and fish oil.  EEG negative for seizure  HgbA1c 6.1, WNL  SCDs and heparin subq for VTE prophyxis  Cardiac .   Activity as tolerated  Therapy needs:  SNF vs. CIR  Hypertension   Home meds:  Norvasc, losartan and metoprolol. Resumed in hospital  BP 95-170 past 24h (05/16/2014 @ 9:59 PM)  SBP goal <160  Relatively, stable  Patient counseled to be compliant with her blood pressure medications  Hyperlipidemia  Home meds:  none.   LDL 184  LDL goal < 100  on statin in the past but not tolerating due to myalgias. Will add fenofibrate and fish oil.  Dementia - diagnosed in the past - dementia  Work up negative except high LDL - HIV pending - add aricept today for profound dementia.  Other Stroke Risk Factors Advanced age Cigarette smoker, advised to stop smoking   Dementia   Coronary artery disease  Other Active Problems  Left cheek abscess s/p drainage on 05/04/14  Hospital day # 2   Marvel Plan, MD  PhD Stroke Neurology 05/16/2014 9:59 PM      To contact Stroke Continuity provider, please refer to [email or web address]. After hours, contact General Neurology

## 2014-05-16 NOTE — Procedures (Signed)
ELECTROENCEPHALOGRAM REPORT  Date of Study: 05/16/2014  Patient's Name: Deborah [name] MRN: [id number] Date of Birth: [birth year]  Referring Provider: Dr. Marvel Plan  Clinical History: This is a 68 y.o. Woman with left LE weakness and confusion.    Medications: allopurinol (ZYLOPRIM) tablet 100 mg  amLODipine (NORVASC) tablet 5 mg  cloNIDine (CATAPRES) tablet 0.1 mg  DULoxetine (CYMBALTA) DR capsule 60 mg  famotidine (PEPCID) tablet 20 mg  losartan (COZAAR) tablet 50 mg  metoprolol succinate (TOPROL-XL) 24 hr tablet 100 mg  rOPINIRole (REQUIP) tablet 2 mg   Technical Summary: A multichannel digital EEG recording measured by the international 10-20 system with electrodes applied with paste and impedances below 5000 ohms performed as portable with EKG monitoring in an awake and asleep patient.  Hyperventilation and photic stimulation were not performed.  The digital EEG was referentially recorded, reformatted, and digitally filtered in a variety of bipolar and referential montages for optimal display.   Description: The patient is awake and asleep during the recording. She intermittently follows commands.  During maximal wakefulness, there is no clear posterior dominant rhythm. The background consists of a moderate amount of diffuse 5-6 Hz theta and 2-3 Hz delta slowing, at times sharply contoured over the right temporal region without clear epileptogenic potential. During drowsiness and sleep, there is an increase in theta and delta slowing of the background with poorly formed vertex waves seen.  There were no clear epileptiform discharges or electrographic seizures seen.    EKG lead was unremarkable.  Impression: This awake and asleep EEG is abnormal due to moderate diffuse slowing of the waking background.  Clinical Correlation of the above findings indicates diffuse cerebral dysfunction that is non-specific in etiology and can be seen with hypoxic/ischemic injury, toxic/metabolic  encephalopathies, neurodegenerative disorders, or medication effect.  The absence of epileptiform discharges does not rule out a clinical diagnosis of epilepsy.  Clinical correlation is advised.   Patrcia Dolly, M.D.

## 2014-05-16 NOTE — Evaluation (Signed)
Occupational Therapy Evaluation Patient Details Name: Deborah [name] MRN: [id number] DOB: Aug 08, 1946 Today's Date: 05/16/2014    History of Present Illness 68 yo female admitted for Lt side weakness and s/ p fall from toilet. Pt from facility PTA. CT reveals: 1.1 x 2.7 cm high right parafalcine hematoma with adjacent cerebral edema - question focal subdural hematoma vs intraparenchymal hemorrhage. Smaller 4 mm diffuse right parafalcine subdural hematoma. PMH: HTN, COPD, bipolar, hyperlipidemia, left cheek abscess 9/2   Clinical Impression   Pt demonstrates Lt side extensor tone, decr awareness to LT side, cognitive deficits, and incontinence of bladder Pt will require 24/7 (A) upon d./c and most appropriate for d/c SNF. Pt from facility and poor historian at this time. NO family present to ask questions and unknown facility. Ot to follow acutely for adl retraining and static sitting balance.     Follow Up Recommendations  SNF;Supervision/Assistance - 24 hour    Equipment Recommendations  Wheelchair (measurements OT);Wheelchair cushion (measurements OT);Hospital bed    Recommendations for Other Services       Precautions / Restrictions Precautions Precautions: Fall Precaution Comments: high risk for skin break down due to decr mobility and incontinence      Mobility Bed Mobility Overal bed mobility: Needs Assistance Bed Mobility: Rolling;Sidelying to Sit;Supine to Sit;Sit to Supine Rolling: Max assist Sidelying to sit: Max assist Supine to sit: +2 for physical assistance;Max assist Sit to supine: +2 for physical assistance;Max assist   General bed mobility comments: Pt requires cues for seuqencing and unable to use LT UE to help wtih bed mobility. Pt sitting on LT side. pt provided weight bearing in LT UE with this transfer  Transfers Overall transfer level: Needs assistance Equipment used:  (stedy) Transfers: Sit to/from Stand Sit to Stand: +2 physical assistance;Max  assist;+2 safety/equipment         General transfer comment: Pt static standing briefly and unable to sustain. pt becoming very anxious in standing an fearful of falling. pt pushing with extensor tone in LT UE. Pt required hand over hand to hold LT UE    Balance Overall balance assessment: Needs assistance Sitting-balance support: Single extremity supported;Feet supported Sitting balance-Leahy Scale: Poor   Postural control: Posterior lean;Left lateral lean Standing balance support: During functional activity;Single extremity supported Standing balance-Leahy Scale: Zero                              ADL Overall ADL's : Needs assistance/impaired Eating/Feeding: Minimal assistance;Bed level Eating/Feeding Details (indicate cue type and reason): cues to initiate and terminating task quickly Grooming: Wash/dry face;Sitting;Minimal assistance Grooming Details (indicate cue type and reason): pt coughing up secretions Upper Body Bathing: Maximal assistance;Sitting   Lower Body Bathing: Sit to/from stand;+2 for physical assistance;Total assistance           Toilet Transfer:  (unsafe to attempt this session)             General ADL Comments: pt voiding bladder and reports urination with coughing. pt coughing alot with poor secretion control on arrival. pt progressed to EOB. Pt attempting sit<>Stand x2 in stedy. Pt with full LT side extension with standing and unable to weight bear on LT LE. Pt shifting weight only the right. pt able to sustain standing for ~45 seconds. pt was unsafe to transfer at this time     Vision                 Additional  Comments: LT inattention noted   Perception Perception Perception Tested?: Yes Perception Deficits: Inattention/neglect Inattention/Neglect: Does not attend to left side of body;Does not attend to left visual field Comments: deficit to assess due to cognition   Praxis      Pertinent Vitals/Pain Pain Assessment:  No/denies pain     Hand Dominance Right   Extremity/Trunk Assessment Upper Extremity Assessment Upper Extremity Assessment: LUE deficits/detail LUE Deficits / Details: tone, cortical grasp present, full extensor tone. Hand placed on pillow in elevated position with wash cloth in palm to decr pressure wounds in palm LUE Coordination: decreased fine motor;decreased gross motor   Lower Extremity Assessment Lower Extremity Assessment: Defer to PT evaluation (in static standing pt keeping all weight off LT LE)   Cervical / Trunk Assessment Cervical / Trunk Assessment: Normal   Communication Communication Communication: Expressive difficulties   Cognition Arousal/Alertness: Awake/alert Behavior During Therapy: WFL for tasks assessed/performed Overall Cognitive Status: Impaired/Different from baseline Area of Impairment: Orientation;Attention;Memory;Following commands;Safety/judgement;Awareness;Problem solving Orientation Level: Disoriented to;Place;Time;Situation Current Attention Level: Sustained Memory: Decreased recall of precautions;Decreased short-term memory Following Commands: Follows one step commands inconsistently;Follows one step commands with increased time Safety/Judgement: Decreased awareness of safety;Decreased awareness of deficits Awareness: Intellectual Problem Solving: Slow processing;Decreased initiation;Difficulty sequencing;Requires tactile cues;Requires verbal cues General Comments: Pt unable to report reason for admission or previous location that she lived. Pt repeating statements and asking the same question. pt easily distracted by TV and asking questions regarding topics on the tv unrelated to session. TV was turned off to help pt focus. Pt with Lt in attention. pt asked if she wanted lunch and denies being hungry. Food placed in Rt visual field and begins to eat lunch with min (A)    General Comments       Exercises       Shoulder Instructions       Home Living Family/patient expects to be discharged to:: Other (Comment) (facility uncertain if ALF or SNF)     Type of Home: Assisted living (per chart)                           Additional Comments: Pt poor historian. pt reports "i live with this lady"      Prior Functioning/Environment Level of Independence: Needs assistance    ADL's / Homemaking Assistance Needed: from facility uncertain to level of care        OT Diagnosis: Generalized weakness;Cognitive deficits;Disturbance of vision;Acute pain;Hemiplegia non-dominant side   OT Problem List: Decreased strength;Decreased range of motion;Decreased activity tolerance;Impaired balance (sitting and/or standing);Impaired vision/perception;Decreased coordination;Decreased cognition;Decreased safety awareness;Decreased knowledge of use of DME or AE;Decreased knowledge of precautions;Cardiopulmonary status limiting activity;Impaired sensation;Impaired tone;Impaired UE functional use;Pain   OT Treatment/Interventions: Self-care/ADL training;Therapeutic exercise;Neuromuscular education;Energy conservation;DME and/or AE instruction;Manual therapy;Therapeutic activities;Cognitive remediation/compensation;Visual/perceptual remediation/compensation;Patient/family education;Balance training    OT Goals(Current goals can be found in the care plan section) Acute Rehab OT Goals OT Goal Formulation: Patient unable to participate in goal setting Time For Goal Achievement: 05/30/14 Potential to Achieve Goals: Good  OT Frequency: Min 2X/week   Barriers to D/C: Other (comment) (requires SNF at this time)          Co-evaluation              End of Session Equipment Utilized During Treatment: Gait belt;Other (comment) (stedy) Nurse Communication: Mobility status;Precautions  Activity Tolerance: Patient tolerated treatment well Patient left: in bed;with call bell/phone within reach;with bed alarm set   Time:  1610-9604 OT  Time Calculation (min): 26 min Charges:  OT General Charges $OT Visit: 1 Procedure OT Evaluation $Initial OT Evaluation Tier I: 1 Procedure OT Treatments $Self Care/Home Management : 8-22 mins G-Codes:    Boone Master B 05/22/14, 4:41 PM Pager: [phone]

## 2014-05-16 NOTE — Progress Notes (Signed)
  Echocardiogram 2D Echocardiogram has been performed.  Deborah [name] 05/16/2014, 1:06 PM

## 2014-05-16 NOTE — Progress Notes (Signed)
VASCULAR LAB PRELIMINARY  PRELIMINARY  PRELIMINARY  PRELIMINARY  Carotid duplex  completed.    Preliminary report:  Bilateral:  1-39% ICA stenosis.  Vertebral artery flow is antegrade.      Arabell Neria, RVT 05/16/2014, 2:38 PM

## 2014-05-16 NOTE — Evaluation (Signed)
Speech Language Pathology Evaluation Patient Details Name: Deborah [name] MRN: [id number] DOB: 1946-03-22 Today's Date: 05/16/2014 Time: 7829-5621 SLP Time Calculation (min): 16 min  Problem List:  Patient Active Problem List   Diagnosis Date Noted  . 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 Stroke 05/14/2014  . Abscess of face 05/06/2014  . Stress incontinence 04/20/2013  . Moderate dementia 04/20/2013  . PLMD (periodic limb movement disorder) 03/01/2013  . Hypoxia, sleep related 07/05/2011  . ACQUIRED HEMOLYTIC ANEMIA UNSPECIFIED 08/08/2010  . HYPERGLYCEMIA 06/01/2010  . HYPONATREMIA 04/11/2010  . HYPERLIPIDEMIA 03/24/2010  . PULMONARY NODULE 11/24/2009  . FATTY LIVER DISEASE 03/01/2009  . HEPATOMEGALY 03/01/2009  . 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 ASTHMA 09/17/2008  . 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 HYPERCHOLESTEROLEMIA 10/28/2007  . BIPOLAR DISORDER UNSPECIFIED 10/28/2007  . HYPERTENSION 10/28/2007  . CARDIOMEGALY 10/28/2007  . ALLERGIC RHINITIS CAUSE UNSPECIFIED 10/28/2007  . COPD 10/28/2007  . HIATAL HERNIA 10/28/2007  . DIVERTICULAR DISEASE 10/28/2007  . IRRITABLE BOWEL SYNDROME 10/28/2007  . HEPATIC CYST 10/28/2007  . DEGENERATIVE JOINT DISEASE 10/28/2007  . HEADACHE, CHRONIC 10/28/2007  . ARRHYTHMIA, HX OF 10/28/2007   Past Medical History:  Past Medical History  Diagnosis Date  . Bipolar disorder     [name], NP  . Depression   . Hyperlipidemia   . Hypertension   . Hiatal hernia   . Esophageal reflux   . Other specified disorders of liver   . Headache(784.0)   . Hepatomegaly   . Other chronic nonalcoholic liver disease   . Cardiomegaly   . Irritable bowel syndrome   . Diverticulosis of colon (without mention of hemorrhage)   . COPD (chronic obstructive pulmonary disease)   . Nausea   . Family history of colon cancer   . Gout flare     Left toe  . Dementia   . Chronic headache   . Peripheral neuropathy   . PLMD  (periodic limb movement disorder)    Past Surgical History:  Past Surgical History  Procedure Laterality Date  . Cholecystectomy    . Abdominal hysterectomy      partial  . Ovarian cyst removal    . Removal of cyst on chest wall    . Appendectomy     HPI:  Deborah [name] is a 68 y.o. female with multiple medical problems including HTN, hyperlipidemia, COPD, bipolar disorder, s/p drainage of a left cheek abscess on 9/2, brought to [facility] for further evaluation and management of left LE weakness. CT brain revealed a 1.1 x 2.7 cm high right parafalcine hematoma with adjacent cerebral edema - question focal subdural hematoma vs intraparenchymal hemorrhage. Smaller 4 mm diffuse right parafalcine subdural hematoma. No evidence of hydrocephalus.    Assessment / Plan / Recommendation Clinical Impression  Pt requires Max cues from SLP for basic roblem solving, including utilizing environmental cues for reorientation. Her sustained attention is fleeting, making it difficult for her to retain new information, follow multi-step instructions, and maintain topic of conversation. Suspect her decreased attention and overall cognitive function also impacts her language skills, with anomia, confabulation, and langauge of confusion noted. Recommend acute SLP f/u until baseline information can be determined, with 24/7 supervision upon discharge as cognitively she is not safe to be alone.    SLP Assessment  Patient needs continued Speech Lanaguage Pathology Services    Follow Up Recommendations  Skilled Nursing facility;24 hour supervision/assistance    Frequency and Duration min 2x/week  2 weeks   Pertinent  Vitals/Pain Pain Assessment: No/denies pain   SLP Goals  SLP Goals Potential to Achieve Goals: Fair Potential Considerations: Previous level of function Progress/Goals/Alternative treatment plan discussed with pt/caregiver and they: Agree  SLP Evaluation Prior Functioning  Cognitive/Linguistic  Baseline: Information not available Type of Home: Assisted living (per chart)   Cognition  Overall Cognitive Status: No family/caregiver present to determine baseline cognitive functioning Arousal/Alertness: Awake/alert Orientation Level: Oriented to person;Disoriented to place;Disoriented to time;Disoriented to situation Attention: Sustained Sustained Attention: Impaired Sustained Attention Impairment: Verbal basic;Functional basic Memory: Impaired Memory Impairment: Storage deficit;Retrieval deficit;Decreased recall of new information Awareness: Impaired Awareness Impairment: Intellectual impairment;Emergent impairment;Anticipatory impairment Problem Solving: Impaired Problem Solving Impairment: Functional basic;Verbal basic Behaviors: Confabulation Safety/Judgment: Impaired    Comprehension  Auditory Comprehension Overall Auditory Comprehension: Impaired Yes/No Questions: Impaired Basic Biographical Questions: 76-100% accurate Basic Immediate Environment Questions: 75-100% accurate Complex Questions: 50-74% accurate Commands: Impaired One Step Basic Commands: 75-100% accurate Two Step Basic Commands: 75-100% accurate Multistep Basic Commands: 25-49% accurate Conversation: Simple Interfering Components: Attention EffectiveTechniques: Repetition Visual Recognition/Discrimination Discrimination: Not tested Reading Comprehension Reading Status: Not tested    Expression Expression Primary Mode of Expression: Verbal Verbal Expression Overall Verbal Expression: Impaired Initiation: No impairment Automatic Speech: Name;Social Response Level of Generative/Spontaneous Verbalization: Phrase;Sentence Naming: Impairment Confrontation: Impaired Other Naming Comments: anomia noted in conversation. language of confusion Verbal Errors: Language of confusion;Not aware of errors;Confabulation Pragmatics: Impairment Impairments: Turn Taking Interfering Components:  Attention Non-Verbal Means of Communication: Not applicable Written Expression Written Expression: Not tested   Oral / Motor Motor Speech Overall Motor Speech: Appears within functional limits for tasks assessed   GO      Maxcine Ham, M.A. CCC-SLP [phone]  Maxcine Ham 05/16/2014, 3:41 PM

## 2014-05-16 NOTE — Consult Note (Signed)
Physical Medicine and Rehabilitation Consult Reason for Consult: Right frontal ICH Referring Physician: Dr.Xu   HPI: Deborah [name] is a 68 y.o. right-handed female with history of bipolar disorder, dementia, hypertension. By report patient is a resident of assisted living [facility]. Admitted 05/14/2014 after reported fall question syncopal episode. CT of the brain revealed a 1.1 x 2.7 cm high right parafalcine hematoma with adjacent cerebral edema-question focal subdural hematoma versus intraparenchymal hemorrhage. MRI of the brain shows right parietal hematoma 33 x 21 mm adjacent to the falx. MRA of the head with quality degraded by motion. Echocardiogram and carotid Dopplers are pending. Hospital course ongoing confusion as well as restlessness. Subcutaneous heparin added for DVT prophylaxis 05/16/2014. Physical therapy evaluation completed 05/15/2014 with recommendations of physical medicine rehabilitation consult.   Review of Systems  Unable to perform ROS: mental acuity   Past Medical History  Diagnosis Date  . Bipolar disorder     [name], NP  . Depression   . Hyperlipidemia   . Hypertension   . Hiatal hernia   . Esophageal reflux   . Other specified disorders of liver   . Headache(784.0)   . Hepatomegaly   . Other chronic nonalcoholic liver disease   . Cardiomegaly   . Irritable bowel syndrome   . Diverticulosis of colon (without mention of hemorrhage)   . COPD (chronic obstructive pulmonary disease)   . Nausea   . Family history of colon cancer   . Gout flare     Left toe  . Dementia   . Chronic headache   . Peripheral neuropathy   . PLMD (periodic limb movement disorder)    Past Surgical History  Procedure Laterality Date  . Cholecystectomy    . Abdominal hysterectomy      partial  . Ovarian cyst removal    . Removal of cyst on chest wall    . Appendectomy     Family History  Problem Relation Age of Onset  . Emphysema Mother     . Heart disease Father   . Mental illness Father     Bipolar disorder  . Colon cancer Maternal Grandfather   . Alzheimer's disease Brother   . Bipolar disorder Brother    Social History:  reports that she has never smoked. She has never used smokeless tobacco. She reports that she does not drink alcohol or use illicit drugs. Allergies:  Allergies  Allergen Reactions  . Bee Venom Anaphylaxis  . Dicyclomine Hcl Anaphylaxis  . Omeprazole Other (See Comments)     GI upset.  . Other Other (See Comments)    Reaction to reductase inhibitors per MAR  . Statins Other (See Comments)    myalgias   Medications Prior to Admission  Medication Sig Dispense Refill  . acetaminophen (TYLENOL) 500 MG tablet Take 500 mg by mouth every 8 (eight) hours as needed (pain).      Marland Kitchen allopurinol (ZYLOPRIM) 100 MG tablet Take 100 mg by mouth daily.      Marland Kitchen amLODipine (NORVASC) 5 MG tablet Take 5 mg by mouth daily.      Marland Kitchen azelastine (ASTELIN) 137 MCG/SPRAY nasal spray Place 2 sprays into the nose 2 (two) times daily as needed for rhinitis. Use in each nostril as directed      . DULoxetine (CYMBALTA) 60 MG capsule Take 60 mg by mouth daily.      . famotidine (PEPCID) 20 MG tablet Take 20 mg by mouth daily.      Marland Kitchen  Fluticasone Propionate, Inhal, (FLOVENT DISKUS) 100 MCG/BLIST AEPB Inhale 1 puff into the lungs every 12 (twelve) hours.      Marland Kitchen ipratropium-albuterol (DUONEB) 0.5-2.5 (3) MG/3ML SOLN Take 3 mLs by nebulization every 6 (six) hours. For shortness of breath      . LORazepam (ATIVAN) 0.5 MG tablet Take 0.5 mg by mouth at bedtime.       Marland Kitchen losartan (COZAAR) 50 MG tablet Take 50 mg by mouth daily.      . metoprolol succinate (TOPROL-XL) 100 MG 24 hr tablet Take 100 mg by mouth daily. Take with or immediately following a meal.      . polyethylene glycol (MIRALAX / GLYCOLAX) packet Take 17 g by mouth 2 (two) times daily.       Marland Kitchen rOPINIRole (REQUIP) 2 MG tablet Take 2 mg by mouth every evening.       .  clindamycin (CLEOCIN) 300 MG capsule Take 300 mg by mouth every 8 (eight) hours. For 10 days.  Starting 05/03/14.      . cloNIDine (CATAPRES) 0.1 MG tablet Take 0.1 mg by mouth daily as needed (SBP >180).       Marland Kitchen doxycycline (VIBRA-TABS) 100 MG tablet Take 1 tablet (100 mg total) by mouth 2 (two) times daily.  14 tablet  0    Home: Home Living Family/patient expects to be discharged to:: Assisted living Home Equipment: Other (comment) Additional Comments: pt unreliable historian  Functional History: Prior Function Level of Independence: Needs assistance Gait / Transfers Assistance Needed: unclear, need to confirm with ALF Functional Status:  Mobility: Bed Mobility Overal bed mobility: Needs Assistance Bed Mobility: Rolling;Supine to Sit;Sit to Supine Rolling: Mod assist Supine to sit: Mod assist Sit to supine: Max assist General bed mobility comments: physical assist to repostion left side and roll to/from side Transfers Overall transfer level: Needs assistance Equipment used: 1 person hand held assist Transfers: Sit to/from Stand Sit to Stand: Mod assist General transfer comment: close front guard position, attempted to lift and shift hips to right/HOB and pt able to come to full standing with incr tone LEFT leg able to support weight when maximally shifted to left and right leg advanced to right Ambulation/Gait General Gait Details: unable to test    ADL:    Cognition: Cognition Overall Cognitive Status: History of cognitive impairments - at baseline Orientation Level: Oriented to person;Disoriented to place;Disoriented to time;Disoriented to situation Cognition Arousal/Alertness: Awake/alert Behavior During Therapy: WFL for tasks assessed/performed;Impulsive Overall Cognitive Status: History of cognitive impairments - at baseline Memory: Decreased short-term memory  Blood pressure 140/70, pulse 87, temperature 97.8 F (36.6 C), temperature source Oral, resp. rate 16,  height  (1.676 m), weight 104.3 kg (229 lb 15 oz), SpO2 99.00%. Physical Exam  Constitutional:  68 year old right-handed pleasantly confused female  HENT:  Head: Normocephalic.  Eyes: EOM are normal.  Neck: Normal range of motion. Neck supple. No thyromegaly present.  Cardiovascular: Normal rate and regular rhythm.   Respiratory: Effort normal and breath sounds normal. No respiratory distress.  GI: Soft. Bowel sounds are normal. She exhibits no distension.  Neurological: She is alert.  Patient makes good eye contact with examiner. She has very poor awareness as well as poor medical historian. She was able to provide her name but could not provide place, age or date of birth. Left hemiparesis, dense---spasticity with contracture left arm, leg.   Skin: Skin is warm and dry.  Psychiatric:  Language of confusions, delusional behavior  No results found for this or any previous visit (from the past 24 hour(s)). Dg Chest 2 View  05/14/2014   CLINICAL DATA:  Short of breath.  Cough.  EXAM: CHEST  2 VIEW  COMPARISON:  06/01/2013.  FINDINGS: Cardiopericardial silhouette within normal limits for projection. Chronic bronchitic changes at the RIGHT lung base. There is patchy opacity over the RIGHT hemidiaphragm on the frontal view, which projects over the RIGHT lower lobe on the lateral view. This could represent atelectasis or airspace disease. No effusion.  IMPRESSION: 1. New area of patchy opacity over the RIGHT hemidiaphragm. Atelectasis favored over airspace disease/pneumonia. 2. Chronic bronchitic changes.   Electronically Signed   By: Andreas Newport M.D.   On: 05/14/2014 14:59   Ct Head Wo Contrast  05/15/2014   CLINICAL DATA:  Followup intracranial hemorrhage  EXAM: CT HEAD WITHOUT CONTRAST  TECHNIQUE: Contiguous axial images were obtained from the base of the skull through the vertex without intravenous contrast.  COMPARISON:  Prior MRI and CT from 05/14/2014  FINDINGS: Previously seen  small interhemispheric right parafalcine subdural hemorrhage is similar in size measuring up to 4 mm in transaxial diameter. Parenchymal hematoma within the medial right parietal lobe is likely not significantly change in size measuring 21 x 35 mm. Associated vasogenic edema is similar. No midline shift. No hydrocephalus.  No acute large vessel territory infarct.  Atrophy with chronic small vessel ischemic changes again noted.  No acute abnormality seen about the orbits.  Mild mucoperiosteal thickening noted within the partially visualized maxillary sinuses. Mastoid air cells are clear.  IMPRESSION: 1. No significant interval change in size of right parafalcine hematoma, measuring 21 x 35 mm. 2. Stable right parafalcine subdural hemorrhage measuring 4 mm in maximal diameter. No midline shift. No hydrocephalus.   Electronically Signed   By: Rise Mu M.D.   On: 05/15/2014 07:16   Ct Head Wo Contrast  05/14/2014   CLINICAL DATA:  68 year old female with fall and head injury.  EXAM: CT HEAD WITHOUT CONTRAST  TECHNIQUE: Contiguous axial images were obtained from the base of the skull through the vertex without intravenous contrast.  COMPARISON:  None.  FINDINGS: A 1.1 x 2.7 cm high right parafalcine hematoma with adjacent cerebral edema is noted which may represent a more focal subdural hematoma or possibly an intraparenchymal hemorrhage. A smaller more diffuse right parafalcine subdural hematoma measuring up to 4 mm is noted.  There is no evidence of hydrocephalus, midline shift or acute infarct.  Mild chronic small-vessel white matter ischemic changes are again noted.  Mucosal thickening in bilateral maxillary sinus is noted.  IMPRESSION: 1.1 x 2.7 cm high right parafalcine hematoma with adjacent cerebral edema - question focal subdural hematoma vs intraparenchymal hemorrhage. Smaller 4 mm diffuse right parafalcine subdural hematoma. No evidence of hydrocephalus.  Critical Value/emergent results were  called by telephone at the time of interpretation on 05/14/2014 at 1:12 pm to [facility] , who verbally acknowledged these results.   Electronically Signed   By: Laveda Abbe M.D.   On: 05/14/2014 13:15   Mr Maxine Glenn Head Wo Contrast  05/14/2014   CLINICAL DATA:  Fall.  Intracranial hemorrhage.  EXAM: MRI HEAD WITHOUT CONTRAST  MRA HEAD WITHOUT CONTRAST  TECHNIQUE: Multiplanar, multiecho pulse sequences of the brain and surrounding structures were obtained without intravenous contrast. Angiographic images of the head were obtained using MRA technique without contrast.  COMPARISON:  CT head 05/14/2014  FINDINGS: MRI HEAD FINDINGS  Right parietal hematoma measures 33  x 21 mm unchanged from the CT. This is in the medial parietal lobe over the convexity. It is difficult to be certain if this is intra-axial or extra-axial however this is probably intra-axial. There is adjacent interhemispheric subdural hematoma measuring up to 4 mm in thickness. This also is unchanged from the CT.  There is mild edema in the high right parietal lobe adjacent to the hematoma. Contrast was not administered however there is not a strong suspicion of underlying tumor. This most likely is a parenchymal hematoma which has ruptured into the subdural space.  Ventricle size is normal.  No shift of the midline structures.  Negative for acute ischemic infarction.  MRA HEAD FINDINGS  Image quality is significantly degraded by motion.  Left vertebral artery patent to the basilar. Right vertebral appears occluded after PICA which may be due to atherosclerotic disease. This could also be congenital. The basilar is diffusely diseased and shows moderate narrowing distally. Fetal origin of the posterior cerebral artery bilaterally which is patent.  Internal carotid artery is patent bilaterally. Anterior and middle cerebral arteries are patent bilaterally.  Negative for aneurysm.  IMPRESSION: Small interhemispheric subdural hematoma. Right parietal hematoma  measures 33 x 21 mm adjacent to the falx. This may represent a parenchymal hematoma which has ruptured into the subdural space.  Negative for acute ischemic infarction. No underlying mass lesion is present on unenhanced images  MRA image quality degraded by motion. Distal right vertebral artery not visualized and may be occluded or and in PICA. There is diffuse disease in the basilar.   Electronically Signed   By: Marlan Palau M.D.   On: 05/14/2014 17:33   Mr Brain Wo Contrast  05/14/2014   CLINICAL DATA:  Fall.  Intracranial hemorrhage.  EXAM: MRI HEAD WITHOUT CONTRAST  MRA HEAD WITHOUT CONTRAST  TECHNIQUE: Multiplanar, multiecho pulse sequences of the brain and surrounding structures were obtained without intravenous contrast. Angiographic images of the head were obtained using MRA technique without contrast.  COMPARISON:  CT head 05/14/2014  FINDINGS: MRI HEAD FINDINGS  Right parietal hematoma measures 33 x 21 mm unchanged from the CT. This is in the medial parietal lobe over the convexity. It is difficult to be certain if this is intra-axial or extra-axial however this is probably intra-axial. There is adjacent interhemispheric subdural hematoma measuring up to 4 mm in thickness. This also is unchanged from the CT.  There is mild edema in the high right parietal lobe adjacent to the hematoma. Contrast was not administered however there is not a strong suspicion of underlying tumor. This most likely is a parenchymal hematoma which has ruptured into the subdural space.  Ventricle size is normal.  No shift of the midline structures.  Negative for acute ischemic infarction.  MRA HEAD FINDINGS  Image quality is significantly degraded by motion.  Left vertebral artery patent to the basilar. Right vertebral appears occluded after PICA which may be due to atherosclerotic disease. This could also be congenital. The basilar is diffusely diseased and shows moderate narrowing distally. Fetal origin of the posterior  cerebral artery bilaterally which is patent.  Internal carotid artery is patent bilaterally. Anterior and middle cerebral arteries are patent bilaterally.  Negative for aneurysm.  IMPRESSION: Small interhemispheric subdural hematoma. Right parietal hematoma measures 33 x 21 mm adjacent to the falx. This may represent a parenchymal hematoma which has ruptured into the subdural space.  Negative for acute ischemic infarction. No underlying mass lesion is present on unenhanced images  MRA  image quality degraded by motion. Distal right vertebral artery not visualized and may be occluded or and in PICA. There is diffuse disease in the basilar.   Electronically Signed   By: Marlan Palau M.D.   On: 05/14/2014 17:33    Assessment/Plan: Diagnosis: right parietal ICH 1. Does the need for close, 24 hr/day medical supervision in concert with the patient's rehab needs make it unreasonable for this patient to be served in a less intensive setting? Potentially 2. Co-Morbidities requiring supervision/potential complications: htn, copd 3. Due to bladder management, bowel management, safety, skin/wound care, disease management, medication administration, pain management and patient education, does the patient require 24 hr/day rehab nursing? Potentially 4. Does the patient require coordinated care of a physician, rehab nurse, PT (1-2 hrs/day, 5 days/week), OT (1-2 hrs/day, 5 days/week) and SLP (1-2 hrs/day, 5 days/week) to address physical and functional deficits in the context of the above medical diagnosis(es)? Potentially Addressing deficits in the following areas: balance, endurance, locomotion, strength, transferring, bowel/bladder control, bathing, dressing, feeding, grooming, toileting, cognition, speech, language, swallowing and psychosocial support 5. Can the patient actively participate in an intensive therapy program of at least 3 hrs of therapy per day at least 5 days per week? Potentially 6. The potential for  patient to make measurable gains while on inpatient rehab is fair 7. Anticipated functional outcomes upon discharge from inpatient rehab are TBD  with PT, TBD with OT, TBD with SLP. 8. Estimated rehab length of stay to reach the above functional goals is: TBD 9. Does the patient have adequate social supports to accommodate these discharge functional goals? Potentially 10. Anticipated D/C setting: Home 11. Anticipated post D/C treatments: HH therapy and Outpatient therapy 12. Overall Rehab/Functional Prognosis: fair  RECOMMENDATIONS: This patient's condition is appropriate for continued rehabilitative care in the following setting: see below Patient has agreed to participate in recommended program. N/A Note that insurance prior authorization may be required for reimbursement for recommended care.  Comment: This patient was extremely confused on exam today. Need to clarify patient's functional and cognitive baseline and social supports. Rehab Admissions Coordinator to follow up.  Thanks,  Ranelle Oyster, MD, [place] [facility]     05/16/2014

## 2014-05-16 NOTE — Progress Notes (Signed)
EEG Completed; Results Pending  

## 2014-05-17 DIAGNOSIS — E785 Hyperlipidemia, unspecified: Secondary | ICD-10-CM

## 2014-05-17 LAB — HIV ANTIBODY (ROUTINE TESTING W REFLEX): HIV: NONREACTIVE

## 2014-05-17 MED ORDER — FENOFIBRATE 160 MG PO TABS
160.0000 mg | ORAL_TABLET | Freq: Every day | ORAL | Status: DC
  Administered 2014-05-17 – 2014-05-18 (×2): 160 mg via ORAL
  Filled 2014-05-17 (×2): qty 1

## 2014-05-17 NOTE — Care Management Note (Addendum)
  Page 1 of 1   05/17/2014     2:21:15 PM CARE MANAGEMENT NOTE 05/17/2014  Patient:  Deborah [name], Deborah [name]   Account Number:  [id number]  Date Initiated:  05/17/2014  Documentation initiated by:  Elmer Bales  Subjective/Objective Assessment:   Patient was admitted with stroke due to ICH. Resides at [facility].     Action/Plan:   Will follow for discharge needs pending PT/OT evals and physician orders.   Anticipated DC Date:  05/18/2014   Anticipated DC Plan:  SKILLED NURSING FACILITY  In-house referral  Clinical Social Worker         Choice offered to / List presented to:             Status of service:  Completed, signed off Medicare Important Message given?  YES (If response is "NO", the following Medicare IM given date fields will be blank) Date Medicare IM given:  05/17/2014 Medicare IM given by:  Elmer Bales Date Additional Medicare IM given:   Additional Medicare IM given by:    Discharge Disposition:    Per UR Regulation:  Reviewed for med. necessity/level of care/duration of stay  If discussed at Long Length of Stay Meetings, dates discussed:    Comments:  05/17/14 1140 Elmer Bales RN, MSN, CM- Medicare IM letter provided.

## 2014-05-17 NOTE — Clinical Social Work Placement (Addendum)
Clinical Social Work Department CLINICAL SOCIAL WORK PLACEMENT NOTE 05/17/2014  Patient:  ADENA, SIMA  Account Number:  [id number] Admit date:  05/14/2014  Clinical Social Worker:  Mosie Epstein  Date/time:  05/17/2014 02:17 PM  Clinical Social Work is seeking post-discharge placement for this patient at the following level of care:   SKILLED NURSING   (*CSW will update this form in Epic as items are completed)   05/17/2014  Patient/family provided with [facility] Department of Clinical Social Work's list of facilities offering this level of care within the geographic area requested by the patient (or if unable, by the patient's family).  05/17/2014  Patient/family informed of their freedom to choose among providers that offer the needed level of care, that participate in Medicare, Medicaid or managed care program needed by the patient, have an available bed and are willing to accept the patient.  05/17/2014  Patient/family informed of [facility]' ownership interest in [facility], as well as of the fact that they are under no obligation to receive care at this facility.  PASARR submitted to EDS on  PASARR number received on   FL2 transmitted to all facilities in geographic area requested by pt/family on  05/17/2014 FL2 transmitted to all facilities within larger geographic area on   Patient informed that his/her managed care company has contracts with or will negotiate with  certain facilities, including the following:     Patient/family informed of bed offers received:  05/18/2014 Patient chooses bed at [facility] Physician recommends and patient chooses bed at    Patient to be transferred to [facility] on 05/18/2014  Patient to be transferred to facility by [facility] Patient and family notified of transfer on 05/18/2014 Name of family member notified:  Elloise Roark, pt's son and guardian.  The following physician request were  entered in Epic:   Additional Comments: PASARR previously existing.  Marcelline Deist, LCSWA [phone]) Licensed Clinical Social Worker Neuroscience [phone]) and Medical ICU (57M)

## 2014-05-17 NOTE — Progress Notes (Signed)
STROKE TEAM PROGRESS NOTE   HISTORY Deborah [name] is an 68 y.o. female with multiple medical problems including HTN, hyperlipidemia, COPD, bipolar disorder, s/p drainage of a left cheek abscess on 9/2, brought to [facility] for further evaluation and management of left LE weakness.  Patient is confused and said that she doesn't really knows what happened but has been unable to move the left leg and arm since earlier today.  As per ER notes, " She was having a "coughing attack" and she needed to use the restroom. Pt states that when she tried to get of from the toilet she fell. "nursing home reports that she stated at the time that she had a syncopal episode". Pt states that she then tried to get up but she couldn't move her left leg. Pt states that she doesn't feel it and she can't move it".  CT brain revealed a 1.1 x 2.7 cm high right parafalcine hematoma with adjacent  cerebral edema - question focal subdural hematoma vs intraparenchymal hemorrhage. Smaller 4 mm diffuse right parafalcine subdural hematoma. No evidence of Hydrocephalus.  At this time,she is alert and awake, answers questions but is confused and complains of nausea and frontal HA. Denies double vision, speech changes, numbness, visual disturbances, CP, SOB, or palpitations.  Neurosurgery has been consulted and they recommended no neurosurgical intervention but evaluation by neurology.  Date last known well: 05/14/14  Time last known well: uncertain  tPA Given: no, cerebral hemorrhage.  SUBJECTIVE (INTERVAL HISTORY) No family is at the bedside.  Overall she feels her condition is stable. She is eating her lunch during rounds. She is still demented with very short attention span and poor memory with jumping thoughts. PT/OT evaluation suggested SNF.     OBJECTIVE Temp:  [97.7 F (36.5 C)-98.7 F (37.1 C)] 98.6 F (37 C) (09/15 1900) Pulse Rate:  [77-87] 77 (09/15 1900) Cardiac Rhythm:  [-] Normal sinus rhythm (09/15 0800) Resp:   [18] 18 (09/15 1900) BP: (105-147)/(56-102) 105/56 mmHg (09/15 1900) SpO2:  [95 %-98 %] 98 % (09/15 1942)  No results found for this basename: GLUCAP,  in the last 168 hours  Recent Labs Lab 05/14/14 1235 05/16/14 0903  NA 135* 135*  K 4.9 4.3  CL 98 97  CO2 25 23  GLUCOSE 86 115*  BUN 20 14  CREATININE 0.89 0.74  CALCIUM 9.6 9.7   No results found for this basename: AST, ALT, ALKPHOS, BILITOT, PROT, ALBUMIN,  in the last 168 hours  Recent Labs Lab 05/14/14 1235 05/16/14 0903  WBC 12.7* 8.7  HGB 13.2 12.6  HCT 40.3 37.6  MCV 86.1 86.0  PLT 384 365   No results found for this basename: CKTOTAL, CKMB, CKMBINDEX, TROPONINI,  in the last 168 hours No results found for this basename: LABPROT, INR,  in the last 72 hours No results found for this basename: COLORURINE, APPERANCEUR, LABSPEC, PHURINE, GLUCOSEU, HGBUR, BILIRUBINUR, KETONESUR, PROTEINUR, UROBILINOGEN, NITRITE, LEUKOCYTESUR,  in the last 72 hours     Component Value Date/Time   CHOL 269* 05/16/2014 0903   TRIG 161* 05/16/2014 0903   HDL 53 05/16/2014 0903   CHOLHDL 5.1 05/16/2014 0903   VLDL 32 05/16/2014 0903   LDLCALC 184* 05/16/2014 0903   Lab Results  Component Value Date   HGBA1C 6.1* 05/16/2014      Component Value Date/Time   LABOPIA NONE DETECTED 06/03/2013 0036   COCAINSCRNUR NONE DETECTED 06/03/2013 0036   LABBENZ NONE DETECTED 06/03/2013 0036   AMPHETMU  NONE DETECTED 06/03/2013 0036   THCU NONE DETECTED 06/03/2013 0036   LABBARB NONE DETECTED 06/03/2013 0036    No results found for this basename: ETH,  in the last 168 hours  Dg Chest 2 View  05/14/2014   IMPRESSION: 1. New area of patchy opacity over the RIGHT hemidiaphragm. Atelectasis favored over airspace disease/pneumonia. 2. Chronic bronchitic changes.    Ct Head Wo Contrast  05/15/2014   IMPRESSION: 1. No significant interval change in size of right parafalcine hematoma, measuring 21 x 35 mm. 2. Stable right parafalcine subdural hemorrhage  measuring 4 mm in maximal diameter. No midline shift. No hydrocephalus.      Ct Head Wo Contrast  05/14/2014   IMPRESSION: 1.1 x 2.7 cm high right parafalcine hematoma with adjacent cerebral edema - question focal subdural hematoma vs intraparenchymal hemorrhage. Smaller 4 mm diffuse right parafalcine subdural hematoma. No evidence of hydrocephalus.   Mri and Mra Head Wo Contrast  05/14/2014   IMPRESSION: Small interhemispheric subdural hematoma. Right parietal hematoma measures 33 x 21 mm adjacent to the falx. This may represent a parenchymal hematoma which has ruptured into the subdural space.  Negative for acute ischemic infarction. No underlying mass lesion is present on unenhanced images  MRA image quality degraded by motion. Distal right vertebral artery not visualized and may be occluded or and in PICA. There is diffuse disease in the basilar.      2D echo - - Left ventricle: The cavity size was normal. There was mild concentric hypertrophy. Systolic function was normal. The estimated ejection fraction was in the range of 60% to 65%. Wall motion was normal; there were no regional wall motion abnormalities. Doppler parameters are consistent with abnormal left ventricular relaxation (grade 1 diastolic dysfunction). The E/e&' ratio is >15, suggesting elevated LV filling pressure. - Mitral valve: Calcified annulus. There was trivial regurgitation. - Left atrium: Severely dilated (27 cm2). - Atrial septum: A patent foramen ovale cannot be excluded. - Inferior vena cava: The vessel was normal in size. The respirophasic diameter changes were in the normal range (>= 50%), consistent with normal central venous pressure. Impressions: - LVEF 60-65%, mild concentric LVH, diastolic dysfunction, elevated LV filling pressure, severe LAE.   Carotid dippler - Bilateral: 1-39% ICA stenosis. Vertebral artery flow is antegrade.  EEG - This awake and asleep EEG is abnormal due to moderate diffuse   slowing of the waking background. Clinical Correlation of the above findings indicates diffuse  cerebral dysfunction that is non-specific in etiology and can be  seen with hypoxic/ischemic injury, toxic/metabolic  encephalopathies, neurodegenerative disorders, or medication  effect.    PHYSICAL EXAM  Temp:  [97.7 F (36.5 C)-98.7 F (37.1 C)] 98.6 F (37 C) (09/15 1900) Pulse Rate:  [77-87] 77 (09/15 1900) Resp:  [18] 18 (09/15 1900) BP: (105-147)/(56-102) 105/56 mmHg (09/15 1900) SpO2:  [95 %-98 %] 98 % (09/15 1942)  General - Well nourished, well developed, in no apparent distress.  Ophthalmologic - Sharp disc margins OU.  Cardiovascular - Regular rate and rhythm with no murmur.  Mental Status -  Awake alert and orientated to people but to time and place.  Language including expression, naming, repetition, comprehension was assessed and found intact. Pt was talkative, however, presented with racing thoughts and diminished attention span and concentration. Constant changing topics with perseveration. Delayed recall 0/3.   Cranial Nerves II - XII - II - Vision intact OU. III, IV, VI - Extraocular movements intact. V - Facial sensation intact bilaterally.  VII - Facial movement intact bilaterally. VIII - Hearing & vestibular intact bilaterally. X - Palate elevates symmetrically. XI - Chin turning & shoulder shrug intact bilaterally. XII - Tongue protrusion intact.  Motor Strength - The patient's strength was 1/5 LUE and 0/5 LLE, trace withdraw on LLE on pain stimulation.  Bulk was normal and fasciculations were absent.   Motor Tone - Muscle tone was assessed at the neck and appendages and was normal.  Reflexes - The patient's reflexes were 1+ LUE and LLE, 2+ RUE and RLE and she had no pathological reflexes.  Sensory - Light touch, temperature/pinprick were assessed and were decreased on the left.    Coordination - The patient had normal movements in right hand with no  ataxia or dysmetria.  Tremor was absent.  Gait and Station - not tested.   ASSESSMENT/PLAN  Ms. TAMEEKA LUO is a 68 y.o. female with history of HTN, HLD, dementia, bipolar, s/p drainage of a left cheek abscess on 9/2 admitted for LLE weakness. CT and MRI showed right frontal high convexity ICH with parafalx SDH. Considering her long standing dementia and cortical bleeding, CAA is on the top of the DDx, but did not showed significant micorbleedings. . Of course, HTN bleeds also on the DDx.  Right frontal high convexity ICH with falcine SDH, most likely untraumatic due to no scalp laceration or external bleeding or skull fracture  no antiplatelet  prior to admission, now on no antiplatelet due to bleeding  MRI - confirmed right high convexity frontal ICH with paraflax SDH  MRA - Distal right vertebral artery not visualized and may be occluded or and in PICA. There is diffuse disease in the basilar.  Carotid Doppler unremarkable  2D Echo unremarkable   LDL 184 - on statin in the past but not tolerating due to myalgias. Will add fenofibrate.  EEG negative for seizure  HgbA1c 6.1, WNL  SCDs and heparin subq for VTE prophyxis  Cardiac .   Activity as tolerated  Therapy needs:  SNF  Hypertension   Home meds:  Norvasc, losartan and metoprolol. Resumed in hospital  BP 111-147 past 24h (05/17/2014 @ 9:31 PM)  SBP goal <160  Relatively, stable  Patient counseled to be compliant with her blood pressure medications  Hyperlipidemia  Home meds:  none.   LDL 184  LDL goal < 100  on statin in the past but not tolerating due to myalgias. Will add fenofibrate.  Dementia - diagnosed in the past - dementia  Work up negative except high LDL - HIV pending - add aricept today for profound dementia.  Other Stroke Risk Factors Advanced age Cigarette smoker, advised to stop smoking   Dementia   Coronary artery disease  Other Active Problems  Left cheek abscess s/p  drainage on 05/04/14  Hospital day # 3   Marvel Plan, MD PhD Stroke Neurology 05/17/2014 9:31 PM      To contact Stroke Continuity provider, please refer to [email or web address]. After hours, contact General Neurology

## 2014-05-17 NOTE — Progress Notes (Signed)
Physical Therapy Treatment Patient Details Name: Deborah [name] MRN: [id number] DOB: [birth year] Today's Date: 05/17/2014    History of Present Illness 68 yo female admitted for Lt side weakness and s/ p fall from toilet. Pt from facility PTA. CT reveals: 1.1 x 2.7 cm high right parafalcine hematoma with adjacent cerebral edema - question focal subdural hematoma vs intraparenchymal hemorrhage. Smaller 4 mm diffuse right parafalcine subdural hematoma. PMH: HTN, COPD, bipolar, hyperlipidemia, left cheek abscess 9/2    PT Comments    Pt is progressing well with her mobility, taking some good weight on her legs.  Her issue will be carryover and awareness due to her h/o dementia.  She seems to have <5 min reset of information given to her during our session today repeating questions as she tries to figure out where she is and why.  PT will continue to follow acutely and pt is appropriate for SNF level rehab since she does not qualify for CIR level therapies.    Follow Up Recommendations  SNF (did not qualify for CIR)     Equipment Recommendations  None recommended by PT    Recommendations for Other Services   NA     Precautions / Restrictions Precautions Precautions: Fall Precaution Comments: high risk for skin break down due to decr mobility and incontinence, left sided weakness    Mobility  Bed Mobility Overal bed mobility: Needs Assistance Bed Mobility: Supine to Sit     Supine to sit: +2 for physical assistance;Max assist     General bed mobility comments: Two person max assist to go to sitting due to poor initiation from pt (even when given extra time, verbal, tactile and finally manual cues to assist to get to sitting EOB).   Transfers Overall transfer level: Needs assistance Equipment used: None Transfers: Sit to/from [employer] Sit to Stand: +2 physical assistance;Mod assist Stand pivot transfers: +2 physical assistance;Mod assist       General  transfer comment: Two person mod assist to stand and pivot x 3 to chair, BSC, and then back to chair.  Assist needed to support trunk, decreased left lean during standing and transfers, and support left knee in WB/stance phase of stepping.    Ambulation/Gait             General Gait Details: Unable at this time safely even with two person assist.        Modified Rankin (Stroke Patients Only) Modified Rankin (Stroke Patients Only) Pre-Morbid Rankin Score:  (unknown) Modified Rankin: Severe disability     Balance Overall balance assessment: Needs assistance Sitting-balance support: Feet supported;Single extremity supported Sitting balance-Leahy Scale: Poor Sitting balance - Comments: Pt needs min assist to maintain sitting balance, with her right hand supported she often pushes to the left with LOB to the left.   Postural control: Left lateral lean Standing balance support: Single extremity supported Standing balance-Leahy Scale: Poor Standing balance comment: Two person mod assist to stand with left knee blocked and right upper extremity supported.                       Cognition Arousal/Alertness: Awake/alert Behavior During Therapy: WFL for tasks assessed/performed Overall Cognitive Status: History of cognitive impairments - at baseline Area of Impairment: Orientation;Attention;Following commands;Memory;Safety/judgement;Awareness;Problem solving Orientation Level: Person;Disoriented to;Place;Time;Situation Current Attention Level: Sustained Memory: Decreased recall of precautions;Decreased short-term memory (< 5 min reset) Following Commands: Follows one step commands inconsistently;Follows one step commands with increased time Safety/Judgement: Decreased  awareness of safety;Decreased awareness of deficits Awareness: Intellectual Problem Solving: Slow processing;Decreased initiation;Difficulty sequencing;Requires verbal cues;Requires tactile cues General Comments:  Pt unaware of where she is, why she is here and every 5 mins her mind resets and she asks where she is and why she is here all over again.  She is unaware of her deficits and does little to address her left side, balance deficits and mobility/gait deficits.        General Comments General comments (skin integrity, edema, etc.): Pt was able to report she needed to urinate, we got to the [facility], she voided, and then to the chair.  I do not believe she is able to do this without being asked if she needs to urinate (i.e. she could not call and tell someone she needed to urinate, but can in the moment when asked).        Pertinent Vitals/Pain Pain Assessment: No/denies pain           PT Goals (current goals can now be found in the care plan section) Acute Rehab PT Goals Patient Stated Goal: none stated, family understanding that she does not qualify for CIR and seeking SNF placement.  Progress towards PT goals: Progressing toward goals    Frequency  Min 3X/week    PT Plan Discharge plan needs to be updated;Frequency needs to be updated       End of Session Equipment Utilized During Treatment: Gait belt Activity Tolerance: Patient tolerated treatment well Patient left: in chair;with call bell/phone within reach     Time: 1215-1244 PT Time Calculation (min): 29 min  Charges:  $Therapeutic Activity: 23-37 mins                      Kimberli Winne B. Blaine Hari, PT, DPT [phone]   05/17/2014, 3:07 PM

## 2014-05-17 NOTE — Clinical Documentation Improvement (Signed)
  68 year old white female admitted with ICH and resultant Subdural Hematoma.  Reported fall in the bathroom prior to admission per H&P.  Please clarify in the progress notes and discharge summary if the ICH was:   - Traumatic 2/2 to the fall   - Nontraumatic, not 2/2 to the fall   - Other Cause   - Unable to Clinically Determine  Thank You, Jerral Ralph ,RN Clinical Documentation Specialist:  [phone] [facility]- Health Information Management

## 2014-05-17 NOTE — Progress Notes (Signed)
Rehab admissions - Evaluated for possible admission.  I spoke with patient's son, [name] and son-in-law, Casimiro Needle.  Patient was in a memory care unit at [facility] with dementia prior to admission.  I explained to family that with confusion and limited carryover with impaired cognition, that SNF is likely best option for rehab at this time.  I have let the case manager know of need for SNF.  [name], her son, has general power of attorney.  I have added his contact information to the emergency contacts list.  Call me for questions.  #[phone]

## 2014-05-17 NOTE — Clinical Social Work Psychosocial (Signed)
Clinical Social Work Department BRIEF PSYCHOSOCIAL ASSESSMENT 05/17/2014  Patient:  Deborah [name], Deborah [name]     Account Number:  [id number]     Admit date:  05/14/2014  Clinical Social Worker:  Mosie Epstein  Date/Time:  05/17/2014 02:11 PM  Referred by:  Physician  Date Referred:  05/17/2014 Referred for  SNF Placement   Other Referral:   none.   Interview type:  Family Other interview type:   CSW spoke with pt's son/legal guardian, Esbeidy Mclaine, regarding discharge disposition.    PSYCHOSOCIAL DATA Living Status:  FACILITY Admitted from facility:  [facility] Level of care:  Assisted Living Primary support name:  Cheronda Erck Primary support relationship to patient:  CHILD, ADULT Degree of support available:   Strong support system.    CURRENT CONCERNS Current Concerns  Post-Acute Placement   Other Concerns:   none.    SOCIAL WORK ASSESSMENT / PLAN CSW consulted for possible SNF placement at time of discharge. CSW spoke with pt's son/legal guardian regarding discharge disposition. Pt's son stated pt is a resident at an Assisted Living ([facility]), but is understanding of pt's recommendation for SNF placement at time of discharge.    CSW to continue to follow and assist with discharge planning needs.   Assessment/plan status:  Psychosocial Support/Ongoing Assessment of Needs Other assessment/ plan:   none.   Information/referral to community resources:   [facility] bed offers.    PATIENT'S/FAMILY'S RESPONSE TO PLAN OF CARE: Pt's son understanding and agreeable to CSW plan of care. Pt's son expressed no further questions or concerns at this time.       Marcelline Deist, LCSWA [phone]) Licensed Clinical Social Worker Neuroscience [phone]) and Medical ICU (56M)

## 2014-05-18 DIAGNOSIS — I10 Essential (primary) hypertension: Secondary | ICD-10-CM

## 2014-05-18 DIAGNOSIS — E785 Hyperlipidemia, unspecified: Secondary | ICD-10-CM

## 2014-05-18 MED ORDER — FENOFIBRATE 160 MG PO TABS: 160.0000 mg | ORAL_TABLET | Freq: Every day | ORAL | Status: DC

## 2014-05-18 MED ORDER — DONEPEZIL HCL 10 MG PO TABS: 10.0000 mg | ORAL_TABLET | Freq: Every day | ORAL | Status: DC

## 2014-05-18 NOTE — Progress Notes (Signed)
1300- report given to Joni Reining, Charity fundraiser at [facility]. Will monitor for pt's departure from unit   Andrew Au I 05/18/2014 2:07 PM

## 2014-05-18 NOTE — ED Provider Notes (Signed)
Medical screening examination/treatment/procedure(s) were performed by non-physician practitioner and as supervising physician I was immediately available for consultation/collaboration.   EKG Interpretation   Date/Time:  Saturday May 14 2014 11:47:24 EDT Ventricular Rate:  85 PR Interval:  149 QRS Duration: 84 QT Interval:  358 QTC Calculation: 426 R Axis:   69 Text Interpretation:  Sinus rhythm Borderline T abnormalities, anterior  leads When compared with ECG of 06/03/2013 No significant change was found  Confirmed by [facility]  MD, Frady Taddeo ([id number]) on 05/14/2014 11:58:17 AM        Samuel Jester, DO 05/18/14 1610

## 2014-05-18 NOTE — Progress Notes (Signed)
Occupational Therapy Treatment Patient Details Name: EVERLY RUBALCAVA MRN: [id number] DOB: Apr 11, 1946 Today's Date: 05/18/2014    History of present illness 68 yo female admitted for Lt side weakness and s/ p fall from toilet. Pt from facility PTA. CT reveals: 1.1 x 2.7 cm high right parafalcine hematoma with adjacent cerebral edema - question focal subdural hematoma vs intraparenchymal hemorrhage. Smaller 4 mm diffuse right parafalcine subdural hematoma. PMH: HTN, COPD, bipolar, hyperlipidemia, left cheek abscess 9/2   OT comments  Pt progressed to [facility] this session with stedy to void bladder. Pt needs constant cues and (A) to correct balance sitting on BSC. Pt is high fall risk due to left lateral lean. Ot to continue to follow acutely for adl retraining.    Follow Up Recommendations  SNF;Supervision/Assistance - 24 hour    Equipment Recommendations  Wheelchair (measurements OT);Wheelchair cushion (measurements OT);Hospital bed    Recommendations for Other Services      Precautions / Restrictions Precautions Precautions: Fall Precaution Comments: high risk for skin break down due to decr mobility and incontinence, left sided weakness       Mobility Bed Mobility Overal bed mobility: Needs Assistance;+2 for physical assistance;+ 2 for safety/equipment Bed Mobility: Supine to Sit;Sit to Supine Rolling: Max assist   Supine to sit: +2 for physical assistance;Max assist Sit to supine: +2 for physical assistance;Mod assist   General bed mobility comments: pt transferring to the right side with incr initiation. pt needed (A) to help with LT side of body due to poor awareness  Transfers Overall transfer level: Needs assistance Equipment used:  (stedy) Transfers: Sit to/from Stand Sit to Stand: +2 physical assistance;Max assist         General transfer comment: pt transferred with stedy and able to maintain position in stedy for ~45 seconds. pt needed cues to correct balance  again    Balance Overall balance assessment: Needs assistance Sitting-balance support: Feet supported;No upper extremity supported Sitting balance-Leahy Scale: Zero   Postural control: Left lateral lean Standing balance support: Bilateral upper extremity supported;During functional activity Standing balance-Leahy Scale: Zero                     ADL Overall ADL's : Needs assistance/impaired                         Toilet Transfer: +2 for physical assistance;Maximal assistance;BSC (stedy) Toilet Transfer Details (indicate cue type and reason): pt required max cueing and decr visual distractions to void bladder on BSC. pt voiding large amount. pt with lateral Lt lean and required (A) to correct LOB Toileting- Clothing Manipulation and Hygiene: Total assistance;+2 for physical assistance         General ADL Comments: Pt required multimodal cues for session and benefits from minimal cueing to help with attention deficits. pt progressed to EOB sitting with lateral lean to the left. pt with left inattention. pt completed sit<>stand with stedy x5 this session for bsc transfer. Pt needed cues to visually attend to the left side during transfers.       Vision                     Perception     Praxis      Cognition   Behavior During Therapy: Impulsive Overall Cognitive Status: History of cognitive impairments - at baseline                  General  Comments: Pt coughing during session and then asking therapist if they coughed. pt with poor recall of instructions and needs constant reinforcement. pt needs simple one word cues and visual cues to initiate    Extremity/Trunk Assessment               Exercises     Shoulder Instructions       General Comments      Pertinent Vitals/ Pain       Pain Assessment: No/denies pain  Home Living                                          Prior Functioning/Environment               Frequency Min 2X/week     Progress Toward Goals  OT Goals(current goals can now be found in the care plan section)  Progress towards OT goals: Progressing toward goals  Acute Rehab OT Goals Patient Stated Goal: none stated, family understanding that she does not qualify for CIR and seeking SNF placement.  OT Goal Formulation: Patient unable to participate in goal setting Time For Goal Achievement: 05/30/14 Potential to Achieve Goals: Good ADL Goals Pt Will Perform Grooming: with min assist;sitting Pt Will Perform Upper Body Bathing: with min assist;sitting Pt Will Transfer to Toilet: with +2 assist;with mod assist;bedside commode Additional ADL Goal #1: Pt will locate food item 2 out 4 trials on tray with min cueing Additional ADL Goal #2: Pt will complete 1 step command with min cueing  Plan Discharge plan remains appropriate    Co-evaluation                 End of Session Equipment Utilized During Treatment: Gait belt;Other (comment) (stedy)   Activity Tolerance Patient tolerated treatment well   Patient Left in bed;with call bell/phone within reach;with bed alarm set   Nurse Communication Mobility status;Precautions        Time: 9562-1308 OT Time Calculation (min): 23 min  Charges: OT General Charges $OT Visit: 1 Procedure OT Treatments $Self Care/Home Management : 23-37 mins  Harolyn Rutherford 05/18/2014, 1:57 PM Pager: [phone]

## 2014-05-18 NOTE — Discharge Summary (Signed)
Stroke Discharge Summary  Patient ID: Deborah [name]   MRN: [id number]      DOB: 04-05-1946  Date of Admission: 05/14/2014 Date of Discharge: 05/18/2014  Attending Physician:  Marvel Plan, Deborah [name], Stroke Deborah [name]  Consulting Physician(s):     None  Patient's PCP:  Deborah [name],CATHERINE, Deborah [name]  DISCHARGE DIAGNOSIS:  Active Problems:   intracerebral hemorrhage left frontal   SDH     BMI: Body mass index is 37.13 kg/(m^2).  Past Medical History  Diagnosis Date  . Bipolar disorder     [name], NP  . Depression   . Hyperlipidemia   . Hypertension   . Hiatal hernia   . Esophageal reflux   . Other specified disorders of liver   . Headache(784.0)   . Hepatomegaly   . Other chronic nonalcoholic liver disease   . Cardiomegaly   . Irritable bowel syndrome   . Diverticulosis of colon (without mention of hemorrhage)   . COPD (chronic obstructive pulmonary disease)   . Nausea   . Family history of colon cancer   . Gout flare     Left toe  . Dementia   . Chronic headache   . Peripheral neuropathy   . PLMD (periodic limb movement disorder)    Past Surgical History  Procedure Laterality Date  . Cholecystectomy    . Abdominal hysterectomy      partial  . Ovarian cyst removal    . Removal of cyst on chest wall    . Appendectomy        Medication List    STOP taking these medications       clindamycin 300 MG capsule  Commonly known as:  CLEOCIN     doxycycline 100 MG tablet  Commonly known as:  VIBRA-TABS      TAKE these medications       acetaminophen 500 MG tablet  Commonly known as:  TYLENOL  Take 500 mg by mouth every 8 (eight) hours as needed (pain).     allopurinol 100 MG tablet  Commonly known as:  ZYLOPRIM  Take 100 mg by mouth daily.     amLODipine 5 MG tablet  Commonly known as:  NORVASC  Take 5 mg by mouth daily.     azelastine 0.1 % nasal spray  Commonly known as:  ASTELIN  Place 2 sprays into the nose 2 (two) times daily as needed for rhinitis. Use  in each nostril as directed     cloNIDine 0.1 MG tablet  Commonly known as:  CATAPRES  Take 0.1 mg by mouth daily as needed (SBP >180).     donepezil 10 MG tablet  Commonly known as:  ARICEPT  Take 1 tablet (10 mg total) by mouth at bedtime.     DULoxetine 60 MG capsule  Commonly known as:  CYMBALTA  Take 60 mg by mouth daily.     famotidine 20 MG tablet  Commonly known as:  PEPCID  Take 20 mg by mouth daily.     fenofibrate 160 MG tablet  Take 1 tablet (160 mg total) by mouth daily.     FLOVENT DISKUS 100 MCG/BLIST Aepb  Generic drug:  Fluticasone Propionate (Inhal)  Inhale 1 puff into the lungs every 12 (twelve) hours.     ipratropium-albuterol 0.5-2.5 (3) MG/3ML Soln  Commonly known as:  DUONEB  Take 3 mLs by nebulization every 6 (six) hours. For shortness of breath     LORazepam 0.5 MG tablet  Commonly  known as:  ATIVAN  Take 0.5 mg by mouth at bedtime.     losartan 50 MG tablet  Commonly known as:  COZAAR  Take 50 mg by mouth daily.     metoprolol succinate 100 MG 24 hr tablet  Commonly known as:  TOPROL-XL  Take 100 mg by mouth daily. Take with or immediately following a meal.     polyethylene glycol packet  Commonly known as:  MIRALAX / GLYCOLAX  Take 17 g by mouth 2 (two) times daily.     rOPINIRole 2 MG tablet  Commonly known as:  REQUIP  Take 2 mg by mouth every evening.        LABORATORY STUDIES CBC    Component Value Date/Time   WBC 8.7 05/16/2014 0903   WBC 6.9 11/10/2011 1715   RBC 4.37 05/16/2014 0903   RBC 4.54 11/10/2011 1715   HGB 12.6 05/16/2014 0903   HGB 13.0 11/10/2011 1715   HCT 37.6 05/16/2014 0903   HCT 40.9 11/10/2011 1715   PLT 365 05/16/2014 0903   MCV 86.0 05/16/2014 0903   MCV 90.0 11/10/2011 1715   MCH 28.8 05/16/2014 0903   MCH 28.6 11/10/2011 1715   MCHC 33.5 05/16/2014 0903   MCHC 31.8 11/10/2011 1715   RDW 13.4 05/16/2014 0903   LYMPHSABS 3.0 04/20/2013 1236   MONOABS 0.5 04/20/2013 1236   EOSABS 0.3 04/20/2013 1236   BASOSABS  0.0 04/20/2013 1236   CMP    Component Value Date/Time   NA 135* 05/16/2014 0903   K 4.3 05/16/2014 0903   CL 97 05/16/2014 0903   CO2 23 05/16/2014 0903   GLUCOSE 115* 05/16/2014 0903   BUN 14 05/16/2014 0903   CREATININE 0.74 05/16/2014 0903   CREATININE 0.97 04/20/2013 1306   CALCIUM 9.7 05/16/2014 0903   PROT 8.2 06/02/2013 2258   ALBUMIN 3.8 06/02/2013 2258   AST 48* 06/02/2013 2258   ALT 52* 06/02/2013 2258   ALKPHOS 69 06/02/2013 2258   BILITOT 0.5 06/02/2013 2258   GFRNONAA 85* 05/16/2014 0903   GFRNONAA 60 04/20/2013 1306   GFRAA >90 05/16/2014 0903   GFRAA 69 04/20/2013 1306   COAGS No results found for this basename: INR, PROTIME   Lipid Panel    Component Value Date/Time   CHOL 269* 05/16/2014 0903   TRIG 161* 05/16/2014 0903   HDL 53 05/16/2014 0903   CHOLHDL 5.1 05/16/2014 0903   VLDL 32 05/16/2014 0903   LDLCALC 184* 05/16/2014 0903   HgbA1C  Lab Results  Component Value Date   HGBA1C 6.1* 05/16/2014   Cardiac Panel (last 3 results) No results found for this basename: CKTOTAL, CKMB, TROPONINI, RELINDX,  in the last 72 hours Urinalysis    Component Value Date/Time   COLORURINE YELLOW 05/14/2014 1332   APPEARANCEUR CLEAR 05/14/2014 1332   LABSPEC 1.014 05/14/2014 1332   PHURINE 5.0 05/14/2014 1332   GLUCOSEU NEGATIVE 05/14/2014 1332   HGBUR TRACE* 05/14/2014 1332   HGBUR negative 04/23/2010 1050   BILIRUBINUR NEGATIVE 05/14/2014 1332   BILIRUBINUR neg 04/20/2013 1213   KETONESUR NEGATIVE 05/14/2014 1332   PROTEINUR NEGATIVE 05/14/2014 1332   PROTEINUR neg 04/20/2013 1213   UROBILINOGEN 0.2 05/14/2014 1332   UROBILINOGEN 0.2 04/20/2013 1213   NITRITE NEGATIVE 05/14/2014 1332   NITRITE neg 04/20/2013 1213   LEUKOCYTESUR NEGATIVE 05/14/2014 1332   Urine Drug Screen     Component Value Date/Time   LABOPIA NONE DETECTED 06/03/2013 0036   COCAINSCRNUR NONE DETECTED 06/03/2013  0036   LABBENZ NONE DETECTED 06/03/2013 0036   AMPHETMU NONE DETECTED 06/03/2013 0036   THCU NONE DETECTED  06/03/2013 0036   LABBARB NONE DETECTED 06/03/2013 0036    Alcohol Level    Component Value Date/Time   [facility] <11 06/02/2013 2258     SIGNIFICANT DIAGNOSTIC STUDIES Dg Chest 2 View  05/14/2014 IMPRESSION: 1. New area of patchy opacity over the RIGHT hemidiaphragm. Atelectasis favored over airspace disease/pneumonia. 2. Chronic bronchitic changes.  Ct Head Wo Contrast  05/15/2014 IMPRESSION: 1. No significant interval change in size of right parafalcine hematoma, measuring 21 x 35 mm. 2. Stable right parafalcine subdural hemorrhage measuring 4 mm in maximal diameter. No midline shift. No hydrocephalus.  Ct Head Wo Contrast  05/14/2014 IMPRESSION: 1.1 x 2.7 cm high right parafalcine hematoma with adjacent cerebral edema - question focal subdural hematoma vs intraparenchymal hemorrhage. Smaller 4 mm diffuse right parafalcine subdural hematoma. No evidence of hydrocephalus.  Mri and Mra Head Wo Contrast  05/14/2014 IMPRESSION: Small interhemispheric subdural hematoma. Right parietal hematoma measures 33 x 21 mm adjacent to the falx. This may represent a parenchymal hematoma which has ruptured into the subdural space. Negative for acute ischemic infarction. No underlying mass lesion is present on unenhanced images MRA image quality degraded by motion. Distal right vertebral artery not visualized and may be occluded or and in PICA. There is diffuse disease in the basilar.  2D echo - - Left ventricle: The cavity size was normal. There was mild concentric hypertrophy. Systolic function was normal. The estimated ejection fraction was in the range of 60% to 65%. Wall motion was normal; there were no regional wall motion abnormalities. Doppler parameters are consistent with abnormal left ventricular relaxation (grade 1 diastolic dysfunction). The E/e&' ratio is >15, suggesting elevated LV filling pressure. - Mitral valve: Calcified annulus. There was trivial regurgitation. - Left atrium: Severely dilated (27  cm2). - Atrial septum: A patent foramen ovale cannot be excluded. - Inferior vena cava: The vessel was normal in size. The respirophasic diameter changes were in the normal range (>= 50%), consistent with normal central venous pressure. Impressions: - LVEF 60-65%, mild concentric LVH, diastolic dysfunction, elevated LV filling pressure, severe LAE.  Carotid dippler - Bilateral: 1-39% ICA stenosis. Vertebral artery flow is antegrade.  EEG - This awake and asleep EEG is abnormal due to moderate diffuse  slowing of the waking background. Clinical Correlation of the above findings indicates diffuse  cerebral dysfunction that is non-specific in etiology and can be  seen with hypoxic/ischemic injury, toxic/metabolic  encephalopathies, neurodegenerative disorders, or medication  effect.      HISTORY OF PRESENT ILLNESS Deborah [name] is an 68 y.o. female with multiple medical problems including HTN, hyperlipidemia, COPD, bipolar disorder, s/p drainage of a left cheek abscess on 9/2, brought to [facility] for further evaluation and management of left LE weakness.  Patient is confused and said that she doesn't really knows what happened but has been unable to move the left leg and arm since earlier today.  As per ER notes, " She was having a "coughing attack" and she needed to use the restroom. Pt states that when she tried to get of from the toilet she fell. "nursing home reports that she stated at the time that she had a syncopal episode". Pt states that she then tried to get up but she couldn't move her left leg. Pt states that she doesn't feel it and she can't move it".  CT brain revealed a 1.1  x 2.7 cm high right parafalcine hematoma with adjacent  cerebral edema - question focal subdural hematoma vs intraparenchymal hemorrhage. Smaller 4 mm diffuse right parafalcine subdural hematoma. No evidence of Hydrocephalus.  At this time,she is alert and awake, answers questions but is confused and complains  of nausea and frontal HA. Denies double vision, speech changes, numbness, visual disturbances, CP, SOB, or palpitations.  Neurosurgery has been consulted and they recommended no neurosurgical intervention but evaluation by neurology.  Date last known well: 05/14/14  Time last known well: uncertain  tPA Given: no, cerebral hemorrhage.  HOSPITAL COURSE Deborah [name] is a 68 y.o. female with history of HTN, HLD, dementia, bipolar, s/p drainage of a left cheek abscess on 9/2 admitted for LLE weakness. CT and MRI showed right frontal high convexity ICH with parafalx SDH. Considering her long standing dementia and cortical bleeding, CAA is on the top of the DDx, but did not showed significant micorbleedings. Of course, HTN bleeds also on the DDx. She was doing well during admission, although left UE and LE remains to be hemiplegic. She has long standing dementia and PT/OT recommend SNF.  Right frontal high convexity ICH with falcine SDH, most likely untraumatic due to no scalp laceration or external bleeding or skull fracture  no antiplatelet prior to admission, now on no antiplatelet due to bleeding. recommand no antiplatelet in the future due to concern of CAA in the setting of long standing dementia.  MRI - confirmed right high convexity frontal ICH with paraflax SDH  MRA - Distal right vertebral artery not visualized and may be occluded or and in PICA. There is diffuse disease in the basilar.  Carotid Doppler unremarkable  2D Echo unremarkable  LDL 184 - on statin in the past but not tolerating due to myalgias. added fenofibrate.  EEG negative for seizure  HgbA1c 6.1, WNL  SCDs and heparin subq for VTE prophyxis  Cardiac .  Activity as tolerated  Therapy needs: SNF Hypertension  Home meds: Norvasc, losartan and metoprolol. Resumed in hospital SBP goal <160  Relatively, stable  Patient counseled to be compliant with her blood pressure medications Hyperlipidemia  Home meds: none.  LDL  184  LDL goal < 100  on statin in the past but not tolerating due to myalgias. added fenofibrate. Dementia  - diagnosed in the past  - dementia Work up negative except high LDL  - HIV pending  - add aricept for profound dementia.  Other Stroke Risk Factors  Advanced age  Cigarette smoker, advised to stop smoking Dementia  Coronary artery disease Other Active Problems  Left cheek abscess s/p drainage on 05/04/14   DISCHARGE EXAM Blood pressure 114/53, pulse 86, temperature 98.3 F (36.8 C), temperature source Oral, resp. rate 18, height  (1.676 m), weight 229 lb 15 oz (104.3 kg), SpO2 96.00%.   General - Well nourished, well developed, in no apparent distress.  Ophthalmologic - Sharp disc margins OU.  Cardiovascular - Regular rate and rhythm with no murmur.  Mental Status -  Awake alert and orientated to people but to time and place.  Language including expression, naming, repetition, comprehension was assessed and found intact.  Pt was talkative, however, presented with racing thoughts and diminished attention span and concentration. Constant changing topics with perseveration. Delayed recall 0/3.  Cranial Nerves II - XII -  II - Vision intact OU.  III, IV, VI - Extraocular movements intact.  V - Facial sensation intact bilaterally.  VII - Facial movement  intact bilaterally.  VIII - Hearing & vestibular intact bilaterally.  X - Palate elevates symmetrically.  XI - Chin turning & shoulder shrug intact bilaterally.  XII - Tongue protrusion intact.  Motor Strength - The patient's strength was 1/5 LUE and 0/5 LLE, trace withdraw on LLE on pain stimulation. Bulk was normal and fasciculations were absent.  Motor Tone - Muscle tone was assessed at the neck and appendages and was normal.  Reflexes - The patient's reflexes were 1+ LUE and LLE, 2+ RUE and RLE and she had no pathological reflexes.  Sensory - Light touch, temperature/pinprick were assessed and were decreased on the  left.  Coordination - The patient had normal movements in right hand with no ataxia or dysmetria. Tremor was absent.  Gait and Station - not tested.   Discharge Diet   Cardiac thin liquids  DISCHARGE PLAN  Disposition:  SNF   no antithrombotics  for secondary stroke prevention.  Ongoing risk factor control by Primary Care Physician. Risk factor recommendations:  Hypertension target range 130-140/70-80 Lipid range - LDL < 100 and checked every 6 months, fasting Diabetes - HgB A1C <7 Smoking cessation   Follow-up Deborah [name],CATHERINE, Deborah [name] in 2 weeks.  Follow-up with Dr. Marvel Plan in Stroke Clinic in 2 months.  25 minutes were spent preparing discharge.  Signed  Marvel Plan, MD PhD Stroke Neurology 05/18/2014 12:42 PM

## 2014-05-18 NOTE — Progress Notes (Signed)
1520 - pt transported out of unit per stretcher by ambulance personnel. No distress noted.   Andrew Au I 05/18/2014 3:24 PM

## 2014-05-18 NOTE — Clinical Social Work Note (Signed)
Pt to be discharged to [facility]. Pt's son, Risa Auman, updated regarding discharge disposition.  Pt to be transported via EMS ([facility]). RN to call report to [facility] at [phone].  Marcelline Deist, LCSWA [phone]) Licensed Clinical Social Worker Neuroscience [phone]) and Medical ICU (16M)

## 2014-05-24 ENCOUNTER — Emergency Department (HOSPITAL_COMMUNITY): Payer: Medicare Other

## 2014-05-24 ENCOUNTER — Emergency Department (HOSPITAL_COMMUNITY)
Admission: EM | Admit: 2014-05-24 | Discharge: 2014-05-24 | Disposition: A | Discharge status: Home / Self Care | Payer: Medicare Other | Attending: Emergency Medicine | Admitting: Emergency Medicine

## 2014-05-24 ENCOUNTER — Encounter (HOSPITAL_COMMUNITY): Payer: Self-pay | Admitting: Emergency Medicine

## 2014-05-24 DIAGNOSIS — G8929 Other chronic pain: Secondary | ICD-10-CM | POA: Insufficient documentation

## 2014-05-24 DIAGNOSIS — I10 Essential (primary) hypertension: Secondary | ICD-10-CM | POA: Diagnosis not present

## 2014-05-24 DIAGNOSIS — Z79899 Other long term (current) drug therapy: Secondary | ICD-10-CM | POA: Insufficient documentation

## 2014-05-24 DIAGNOSIS — S1093XA Contusion of unspecified part of neck, initial encounter: Secondary | ICD-10-CM | POA: Diagnosis not present

## 2014-05-24 DIAGNOSIS — Y9389 Activity, other specified: Secondary | ICD-10-CM | POA: Insufficient documentation

## 2014-05-24 DIAGNOSIS — M109 Gout, unspecified: Secondary | ICD-10-CM | POA: Insufficient documentation

## 2014-05-24 DIAGNOSIS — S0003XA Contusion of scalp, initial encounter: Secondary | ICD-10-CM | POA: Insufficient documentation

## 2014-05-24 DIAGNOSIS — F319 Bipolar disorder, unspecified: Secondary | ICD-10-CM | POA: Diagnosis not present

## 2014-05-24 DIAGNOSIS — W1809XA Striking against other object with subsequent fall, initial encounter: Secondary | ICD-10-CM | POA: Insufficient documentation

## 2014-05-24 DIAGNOSIS — J449 Chronic obstructive pulmonary disease, unspecified: Secondary | ICD-10-CM | POA: Diagnosis not present

## 2014-05-24 DIAGNOSIS — IMO0002 Reserved for concepts with insufficient information to code with codable children: Secondary | ICD-10-CM | POA: Insufficient documentation

## 2014-05-24 DIAGNOSIS — E785 Hyperlipidemia, unspecified: Secondary | ICD-10-CM | POA: Diagnosis not present

## 2014-05-24 DIAGNOSIS — W19XXXA Unspecified fall, initial encounter: Secondary | ICD-10-CM

## 2014-05-24 DIAGNOSIS — J4489 Other specified chronic obstructive pulmonary disease: Secondary | ICD-10-CM | POA: Insufficient documentation

## 2014-05-24 DIAGNOSIS — G609 Hereditary and idiopathic neuropathy, unspecified: Secondary | ICD-10-CM | POA: Insufficient documentation

## 2014-05-24 DIAGNOSIS — K219 Gastro-esophageal reflux disease without esophagitis: Secondary | ICD-10-CM | POA: Diagnosis not present

## 2014-05-24 DIAGNOSIS — S0990XA Unspecified injury of head, initial encounter: Secondary | ICD-10-CM | POA: Diagnosis present

## 2014-05-24 DIAGNOSIS — S0083XA Contusion of other part of head, initial encounter: Principal | ICD-10-CM | POA: Insufficient documentation

## 2014-05-24 DIAGNOSIS — F0390 Unspecified dementia without behavioral disturbance: Secondary | ICD-10-CM | POA: Insufficient documentation

## 2014-05-24 DIAGNOSIS — Y9289 Other specified places as the place of occurrence of the external cause: Secondary | ICD-10-CM | POA: Diagnosis not present

## 2014-05-24 LAB — CBG MONITORING, ED: GLUCOSE-CAPILLARY: 97 mg/dL (ref 70–99)

## 2014-05-24 NOTE — Discharge Instructions (Signed)
Pt's head and neck were scan and were negative for any acute injuries.  Please watch pt for potential fall hazards.  Be sure to follow-up with her primary care provider regarding this fall.  WHEN SHOULD I SEEK IMMEDIATE MEDICAL CARE?  You should get help right away if:  You have confusion or drowsiness.  You feel sick to your stomach (nauseous) or have continued, forceful vomiting.  You have dizziness or unsteadiness that is getting worse.  You have severe, continued headaches not relieved by medicine. Only take over-the-counter or prescription medicines for pain, fever, or discomfort as directed by your health care provider.  You do not have normal function of the arms or legs or are unable to walk.  You notice changes in the black spots in the center of the colored part of your eye (pupil).  You have a clear or bloody fluid coming from your nose or ears.  You have a loss of vision. During the next 24 hours after the injury, you must stay with someone who can watch you for the warning signs. This person should contact local emergency services (911 in the [place] if you have seizures, you become unconscious, or you are unable to wake up

## 2014-05-24 NOTE — ED Provider Notes (Signed)
CSN: [id number]     Arrival date & time 05/24/14  2057 History   First MD Initiated Contact with Patient 05/24/14 2101     Chief Complaint  Patient presents with  . Fall  (Consider location/radiation/quality/duration/timing/severity/associated sxs/prior Treatment) HPI Deborah [name] is a 68 yo female with PMH: dementia, presenting from snf with report of a fall from a sitting position to the floor.  EMS states there was no loss of consciousness.  CBG normal.  Pt was discharged last week after a fall and diagnosis of hemorrhagic stroke.  Pt thinks her head hurts but unsure of what happened.  Past Medical History  Diagnosis Date  . Bipolar disorder     [name], NP  . Depression   . Hyperlipidemia   . Hypertension   . Hiatal hernia   . Esophageal reflux   . Other specified disorders of liver   . Headache(784.0)   . Hepatomegaly   . Other chronic nonalcoholic liver disease   . Cardiomegaly   . Irritable bowel syndrome   . Diverticulosis of colon (without mention of hemorrhage)   . COPD (chronic obstructive pulmonary disease)   . Nausea   . Family history of colon cancer   . Gout flare     Left toe  . Dementia   . Chronic headache   . Peripheral neuropathy   . PLMD (periodic limb movement disorder)    Past Surgical History  Procedure Laterality Date  . Cholecystectomy    . Abdominal hysterectomy      partial  . Ovarian cyst removal    . Removal of cyst on chest wall    . Appendectomy     Family History  Problem Relation Age of Onset  . Emphysema Mother   . Heart disease Father   . Mental illness Father     Bipolar disorder  . Colon cancer Maternal Grandfather   . Alzheimer's disease Brother   . Bipolar disorder Brother    History  Substance Use Topics  . Smoking status: Never Smoker   . Smokeless tobacco: Never Used  . Alcohol Use: No   OB History   Grav Para Term Preterm Abortions TAB SAB Ect Mult Living                 Review of Systems  Unable  to perform ROS: Dementia    Allergies  Bee venom; Dicyclomine hcl; Omeprazole; Other; and Statins  Home Medications   Prior to Admission medications   Medication Sig Start Date End Date Taking? Authorizing Provider  acetaminophen (TYLENOL) 500 MG tablet Take 500 mg by mouth every 8 (eight) hours as needed (pain).    Historical Provider, MD  allopurinol (ZYLOPRIM) 100 MG tablet Take 100 mg by mouth daily.    Historical Provider, MD  amLODipine (NORVASC) 5 MG tablet Take 5 mg by mouth daily.    Historical Provider, MD  azelastine (ASTELIN) 137 MCG/SPRAY nasal spray Place 2 sprays into the nose 2 (two) times daily as needed for rhinitis. Use in each nostril as directed 12/20/11   Maurice March, MD  cloNIDine (CATAPRES) 0.1 MG tablet Take 0.1 mg by mouth daily as needed (SBP >180).     Historical Provider, MD  donepezil (ARICEPT) 10 MG tablet Take 1 tablet (10 mg total) by mouth at bedtime. 05/18/14   Marvel Plan, MD  DULoxetine (CYMBALTA) 60 MG capsule Take 60 mg by mouth daily.    Historical Provider, MD  famotidine (PEPCID) 20  MG tablet Take 20 mg by mouth daily.    Historical Provider, MD  fenofibrate 160 MG tablet Take 1 tablet (160 mg total) by mouth daily. 05/18/14   Marvel Plan, MD  Fluticasone Propionate, Inhal, (FLOVENT DISKUS) 100 MCG/BLIST AEPB Inhale 1 puff into the lungs every 12 (twelve) hours.    Historical Provider, MD  ipratropium-albuterol (DUONEB) 0.5-2.5 (3) MG/3ML SOLN Take 3 mLs by nebulization every 6 (six) hours. For shortness of breath 07/05/11   Agapito Games, MD  LORazepam (ATIVAN) 0.5 MG tablet Take 0.5 mg by mouth at bedtime.     Historical Provider, MD  losartan (COZAAR) 50 MG tablet Take 50 mg by mouth daily.    Historical Provider, MD  metoprolol succinate (TOPROL-XL) 100 MG 24 hr tablet Take 100 mg by mouth daily. Take with or immediately following a meal.    Historical Provider, MD  polyethylene glycol (MIRALAX / GLYCOLAX) packet Take 17 g by mouth 2  (two) times daily.     Historical Provider, MD  rOPINIRole (REQUIP) 2 MG tablet Take 2 mg by mouth every evening.     Historical Provider, MD   There were no vitals taken for this visit. Physical Exam  Nursing note and vitals reviewed. Constitutional: She appears well-developed and well-nourished. No distress.  HENT:  Head: Normocephalic. Head is with contusion.    Mouth/Throat: Oropharynx is clear and moist. No oropharyngeal exudate.  Eyes: Conjunctivae are normal.  Neck: Neck supple. No thyromegaly present.  Cardiovascular: Normal rate, regular rhythm and intact distal pulses.   Pulmonary/Chest: Effort normal and breath sounds normal. No respiratory distress. She has no wheezes. She has no rales. She exhibits no tenderness.  Abdominal: Soft. There is no tenderness.  Musculoskeletal: She exhibits no tenderness.  Lymphadenopathy:    She has no cervical adenopathy.  Neurological: She is alert.  Skin: Skin is warm and dry. No rash noted. She is not diaphoretic.  Psychiatric: She has a normal mood and affect.    ED Course  Procedures (including critical care time) Labs Review Labs Reviewed  CBG MONITORING, ED   Imaging Review Ct Head Wo Contrast  05/24/2014   CLINICAL DATA:  Larey Seat forward from sitting position with bruising to left forehead  EXAM: CT HEAD WITHOUT CONTRAST  CT CERVICAL SPINE WITHOUT CONTRAST  TECHNIQUE: Multidetector CT imaging of the head and cervical spine was performed following the standard protocol without intravenous contrast. Multiplanar CT image reconstructions of the cervical spine were also generated.  COMPARISON:  05/15/2014, 05/14/2014  FINDINGS: CT HEAD FINDINGS  No skull fracture. Bilateral maxillary sinus inflammatory change. Subacute hemorrhage at the right vertex measuring 32 x 20 mm from. Stable in size but decreased in attenuation when compared to prior study. Hyperattenuation in the fall suggest the presence of a small amount of subdural hematoma,  similar to prior study. Low attenuation in the surrounding white matter consistent with edema.  No other foci of hemorrhage. Evidence of mass or infarct. No soft with from fracture.  CT CERVICAL SPINE FINDINGS  Normal alignment. No fracture. Moderate C4-5, C5-6, and C6-7 degenerative disc disease. Mild-to-moderate C7-T1 degenerative disc disease. Incidental note is made of a 5 mm bone island in the C2 vertebral body.  IMPRESSION: 1. Right parafalcine hematoma near the right vertex with associated small subdural hematoma. This shows anticipated evolution with no significant change in size when compared to 05/15/2014. 2. No new acute intracranial abnormalities. 3. No acute cervical spine abnormality.   Electronically Signed  By: Esperanza Heir M.D.   On: 05/24/2014 22:52   Ct Cervical Spine Wo Contrast  05/24/2014   CLINICAL DATA:  Larey Seat forward from sitting position with bruising to left forehead  EXAM: CT HEAD WITHOUT CONTRAST  CT CERVICAL SPINE WITHOUT CONTRAST  TECHNIQUE: Multidetector CT imaging of the head and cervical spine was performed following the standard protocol without intravenous contrast. Multiplanar CT image reconstructions of the cervical spine were also generated.  COMPARISON:  05/15/2014, 05/14/2014  FINDINGS: CT HEAD FINDINGS  No skull fracture. Bilateral maxillary sinus inflammatory change. Subacute hemorrhage at the right vertex measuring 32 x 20 mm from. Stable in size but decreased in attenuation when compared to prior study. Hyperattenuation in the fall suggest the presence of a small amount of subdural hematoma, similar to prior study. Low attenuation in the surrounding white matter consistent with edema.  No other foci of hemorrhage. Evidence of mass or infarct. No soft with from fracture.  CT CERVICAL SPINE FINDINGS  Normal alignment. No fracture. Moderate C4-5, C5-6, and C6-7 degenerative disc disease. Mild-to-moderate C7-T1 degenerative disc disease. Incidental note is made of a 5  mm bone island in the C2 vertebral body.  IMPRESSION: 1. Right parafalcine hematoma near the right vertex with associated small subdural hematoma. This shows anticipated evolution with no significant change in size when compared to 05/15/2014. 2. No new acute intracranial abnormalities. 3. No acute cervical spine abnormality.   Electronically Signed   By: Esperanza Heir M.D.   On: 05/24/2014 22:52     EKG Interpretation None      MDM   Final diagnoses:  Fall, initial encounter   68 yo female with dementia with fall at snf.  Bruising and swelling to left forehead.  She is at baseline mental status.  She is not on blood thinners because of a recent hemorrhagic stroke. CT head and c-spine negative for acute finding.  She is well-appears and seems safe to return to the nursing home.  Discharge instructions include PCP follow-up and return precautions.   Case discussed with Dr. Evon Slack Vitals:   05/24/14 2130 05/24/14 2200 05/24/14 2215 05/24/14 2354  BP: 107/86 102/74 106/87 113/52  Pulse: 81 79 81 82  Temp:      TempSrc:      Resp: SpO2: 100% 100% 98% 96%   Meds given in ED:  Medications - No data to display  Discharge Medication List as of 05/24/2014 11:08 PM          Harle Battiest, NP 05/27/14 2320

## 2014-05-24 NOTE — ED Notes (Signed)
Per EMS, pt fell from a sitting position and hit her head.  No LOC reported.  Pt from [facility].  Pt has hx of dementia.  Pt complains of pain at times, in various areas.  VS 146/88, HR 100, CBG 105, RR 18.  Pt non-ambulatory but tries to get up according to EMS.  Only injury noted was small abrasion to R shin.  Some lesions around mouth noted but present before fall.

## 2014-05-24 NOTE — ED Notes (Addendum)
Pt fell at nursing home.  Fell from sitting position and fell forward.  Bruising to left forehead and abrasion to right shin.  Bruising to left foot.  NAD, respirations equal and unlabored, skin warm and dry.

## 2014-05-24 NOTE — ED Notes (Signed)
PTAR at bedside and report called to facility

## 2014-05-28 NOTE — ED Provider Notes (Signed)
Medical screening examination/treatment/procedure(s) were conducted as a shared visit with non-physician practitioner(s) and myself.  I personally evaluated the patient during the encounter.  Pt s/p fall from chair. No loc. Spine nt.    Suzi Roots, MD 05/28/14 [phone]

## 2015-03-31 ENCOUNTER — Emergency Department (HOSPITAL_COMMUNITY): Payer: Medicare Other

## 2015-03-31 ENCOUNTER — Inpatient Hospital Stay (HOSPITAL_COMMUNITY)
Admission: EM | Admit: 2015-03-31 | Discharge: 2015-04-07 | DRG: 177 | Disposition: A | Discharge status: Home Health | Payer: Medicare Other | Attending: Internal Medicine | Admitting: Internal Medicine

## 2015-03-31 ENCOUNTER — Encounter (HOSPITAL_COMMUNITY): Payer: Self-pay | Admitting: Emergency Medicine

## 2015-03-31 DIAGNOSIS — A419 Sepsis, unspecified organism: Secondary | ICD-10-CM | POA: Diagnosis present

## 2015-03-31 DIAGNOSIS — K7689 Other specified diseases of liver: Secondary | ICD-10-CM | POA: Diagnosis present

## 2015-03-31 DIAGNOSIS — G4761 Periodic limb movement disorder: Secondary | ICD-10-CM | POA: Diagnosis present

## 2015-03-31 DIAGNOSIS — F0390 Unspecified dementia without behavioral disturbance: Secondary | ICD-10-CM | POA: Diagnosis present

## 2015-03-31 DIAGNOSIS — Z825 Family history of asthma and other chronic lower respiratory diseases: Secondary | ICD-10-CM

## 2015-03-31 DIAGNOSIS — R05 Cough: Secondary | ICD-10-CM | POA: Diagnosis present

## 2015-03-31 DIAGNOSIS — D638 Anemia in other chronic diseases classified elsewhere: Secondary | ICD-10-CM | POA: Diagnosis present

## 2015-03-31 DIAGNOSIS — M109 Gout, unspecified: Secondary | ICD-10-CM | POA: Diagnosis present

## 2015-03-31 DIAGNOSIS — Z888 Allergy status to other drugs, medicaments and biological substances status: Secondary | ICD-10-CM

## 2015-03-31 DIAGNOSIS — E785 Hyperlipidemia, unspecified: Secondary | ICD-10-CM | POA: Diagnosis present

## 2015-03-31 DIAGNOSIS — T502X5A Adverse effect of carbonic-anhydrase inhibitors, benzothiadiazides and other diuretics, initial encounter: Secondary | ICD-10-CM | POA: Diagnosis not present

## 2015-03-31 DIAGNOSIS — Z820 Family history of epilepsy and other diseases of the nervous system: Secondary | ICD-10-CM | POA: Diagnosis not present

## 2015-03-31 DIAGNOSIS — Z800 Family history of malignant neoplasm of digestive organs: Secondary | ICD-10-CM

## 2015-03-31 DIAGNOSIS — Z79899 Other long term (current) drug therapy: Secondary | ICD-10-CM

## 2015-03-31 DIAGNOSIS — F03B0 Unspecified dementia, moderate, without behavioral disturbance, psychotic disturbance, mood disturbance, and anxiety: Secondary | ICD-10-CM | POA: Diagnosis present

## 2015-03-31 DIAGNOSIS — Z91030 Bee allergy status: Secondary | ICD-10-CM

## 2015-03-31 DIAGNOSIS — K219 Gastro-esophageal reflux disease without esophagitis: Secondary | ICD-10-CM | POA: Diagnosis present

## 2015-03-31 DIAGNOSIS — J151 Pneumonia due to Pseudomonas: Principal | ICD-10-CM | POA: Diagnosis present

## 2015-03-31 DIAGNOSIS — F319 Bipolar disorder, unspecified: Secondary | ICD-10-CM | POA: Diagnosis present

## 2015-03-31 DIAGNOSIS — J9601 Acute respiratory failure with hypoxia: Secondary | ICD-10-CM | POA: Diagnosis present

## 2015-03-31 DIAGNOSIS — Y95 Nosocomial condition: Secondary | ICD-10-CM | POA: Diagnosis present

## 2015-03-31 DIAGNOSIS — K589 Irritable bowel syndrome without diarrhea: Secondary | ICD-10-CM | POA: Diagnosis present

## 2015-03-31 DIAGNOSIS — G629 Polyneuropathy, unspecified: Secondary | ICD-10-CM | POA: Diagnosis present

## 2015-03-31 DIAGNOSIS — J9811 Atelectasis: Secondary | ICD-10-CM | POA: Diagnosis present

## 2015-03-31 DIAGNOSIS — J189 Pneumonia, unspecified organism: Secondary | ICD-10-CM | POA: Diagnosis present

## 2015-03-31 DIAGNOSIS — G9340 Encephalopathy, unspecified: Secondary | ICD-10-CM | POA: Diagnosis present

## 2015-03-31 DIAGNOSIS — Z993 Dependence on wheelchair: Secondary | ICD-10-CM | POA: Diagnosis not present

## 2015-03-31 DIAGNOSIS — I10 Essential (primary) hypertension: Secondary | ICD-10-CM | POA: Diagnosis present

## 2015-03-31 DIAGNOSIS — J449 Chronic obstructive pulmonary disease, unspecified: Secondary | ICD-10-CM | POA: Diagnosis present

## 2015-03-31 DIAGNOSIS — Z8249 Family history of ischemic heart disease and other diseases of the circulatory system: Secondary | ICD-10-CM

## 2015-03-31 DIAGNOSIS — R1310 Dysphagia, unspecified: Secondary | ICD-10-CM | POA: Diagnosis present

## 2015-03-31 DIAGNOSIS — E876 Hypokalemia: Secondary | ICD-10-CM | POA: Diagnosis not present

## 2015-03-31 DIAGNOSIS — D649 Anemia, unspecified: Secondary | ICD-10-CM | POA: Diagnosis not present

## 2015-03-31 DIAGNOSIS — K449 Diaphragmatic hernia without obstruction or gangrene: Secondary | ICD-10-CM | POA: Diagnosis present

## 2015-03-31 HISTORY — DX: Cutaneous abscess of face: L02.01

## 2015-03-31 LAB — COMPREHENSIVE METABOLIC PANEL
ALT: 18 U/L (ref 14–54)
ANION GAP: 9 (ref 5–15)
AST: 31 U/L (ref 15–41)
Albumin: 3.5 g/dL (ref 3.5–5.0)
Alkaline Phosphatase: 35 U/L — ABNORMAL LOW (ref 38–126)
BUN: 16 mg/dL (ref 6–20)
CO2: 29 mmol/L (ref 22–32)
Calcium: 9.2 mg/dL (ref 8.9–10.3)
Chloride: 95 mmol/L — ABNORMAL LOW (ref 101–111)
Creatinine, Ser: 0.94 mg/dL (ref 0.44–1.00)
GFR calc Af Amer: >60 mL/min (ref >60)
GFR calc non Af Amer: >60 mL/min (ref >60)
Glucose, Bld: 94 mg/dL (ref 65–99)
Potassium: 3.8 mmol/L (ref 3.5–5.1)
SODIUM: 133 mmol/L — AB (ref 135–145)
TOTAL PROTEIN: 7.2 g/dL (ref 6.5–8.1)
Total Bilirubin: 0.9 mg/dL (ref 0.3–1.2)

## 2015-03-31 LAB — I-STAT CG4 LACTIC ACID, ED
Lactic Acid, Venous: 1.03 mmol/L (ref 0.5–2.0)
Lactic Acid, Venous: 1.07 mmol/L (ref 0.5–2.0)

## 2015-03-31 LAB — CBC WITH DIFFERENTIAL/PLATELET
BASOS PCT: 1 % (ref 0–1)
Basophils Absolute: 0 10*3/uL (ref 0.0–0.1)
EOS ABS: 0.1 10*3/uL (ref 0.0–0.7)
EOS PCT: 2 % (ref 0–5)
HEMATOCRIT: 36.8 % (ref 36.0–46.0)
HEMOGLOBIN: 12.1 g/dL (ref 12.0–15.0)
Lymphocytes Relative: 20 % (ref 12–46)
Lymphs Abs: 1.2 10*3/uL (ref 0.7–4.0)
MCH: 28.3 pg (ref 26.0–34.0)
MCHC: 32.9 g/dL (ref 30.0–36.0)
MCV: 86 fL (ref 78.0–100.0)
Monocytes Absolute: 1 10*3/uL (ref 0.1–1.0)
Monocytes Relative: 18 % — ABNORMAL HIGH (ref 3–12)
NEUTROS PCT: 59 % (ref 43–77)
Neutro Abs: 3.4 10*3/uL (ref 1.7–7.7)
PLATELETS: 247 10*3/uL (ref 150–400)
RBC: 4.28 MIL/uL (ref 3.87–5.11)
RDW: 16.1 % — AB (ref 11.5–15.5)
WBC: 5.7 10*3/uL (ref 4.0–10.5)

## 2015-03-31 LAB — URINALYSIS, ROUTINE W REFLEX MICROSCOPIC
Glucose, UA: NEGATIVE mg/dL
Hgb urine dipstick: NEGATIVE
Ketones, ur: NEGATIVE mg/dL
Leukocytes, UA: NEGATIVE
NITRITE: NEGATIVE
Protein, ur: NEGATIVE mg/dL
SPECIFIC GRAVITY, URINE: 1.021 (ref 1.005–1.030)
Urobilinogen, UA: 1 mg/dL (ref 0.0–1.0)
pH: 6 (ref 5.0–8.0)

## 2015-03-31 LAB — EXPECTORATED SPUTUM ASSESSMENT W GRAM STAIN, RFLX TO RESP C: Special Requests: NORMAL

## 2015-03-31 LAB — STREP PNEUMONIAE URINARY ANTIGEN: Strep Pneumo Urinary Antigen: NEGATIVE

## 2015-03-31 LAB — EXPECTORATED SPUTUM ASSESSMENT W REFEX TO RESP CULTURE

## 2015-03-31 MED ORDER — FENOFIBRATE 160 MG PO TABS
160.0000 mg | ORAL_TABLET | Freq: Every day | ORAL | Status: DC
  Administered 2015-04-01 – 2015-04-07 (×7): 160 mg via ORAL
  Filled 2015-03-31 (×8): qty 1

## 2015-03-31 MED ORDER — POLYETHYLENE GLYCOL 3350 17 G PO PACK: 17.0000 g | PACK | Freq: Every day | ORAL | Status: DC | PRN

## 2015-03-31 MED ORDER — SODIUM CHLORIDE 0.9 % IV SOLN
1000.0000 mL | Freq: Once | INTRAVENOUS | Status: AC
  Administered 2015-03-31: 1000 mL via INTRAVENOUS

## 2015-03-31 MED ORDER — DONEPEZIL HCL 10 MG PO TABS
10.0000 mg | ORAL_TABLET | Freq: Every day | ORAL | Status: DC
  Administered 2015-03-31 – 2015-04-06 (×7): 10 mg via ORAL
  Filled 2015-03-31 (×9): qty 1

## 2015-03-31 MED ORDER — DULOXETINE HCL 60 MG PO CPEP
60.0000 mg | ORAL_CAPSULE | Freq: Every day | ORAL | Status: DC
  Administered 2015-04-01: 60 mg via ORAL
  Filled 2015-03-31 (×2): qty 1

## 2015-03-31 MED ORDER — ALBUTEROL SULFATE HFA 108 (90 BASE) MCG/ACT IN AERS: 1.0000 | INHALATION_SPRAY | Freq: Four times a day (QID) | RESPIRATORY_TRACT | Status: DC | PRN

## 2015-03-31 MED ORDER — LOSARTAN POTASSIUM 50 MG PO TABS: 100.0000 mg | ORAL_TABLET | Freq: Every day | ORAL | Status: DC

## 2015-03-31 MED ORDER — EZETIMIBE 10 MG PO TABS
10.0000 mg | ORAL_TABLET | Freq: Every day | ORAL | Status: DC
  Administered 2015-03-31 – 2015-04-07 (×8): 10 mg via ORAL
  Filled 2015-03-31 (×9): qty 1

## 2015-03-31 MED ORDER — DIVALPROEX SODIUM ER 250 MG PO TB24
250.0000 mg | ORAL_TABLET | Freq: Every day | ORAL | Status: DC
  Administered 2015-04-01 – 2015-04-07 (×7): 250 mg via ORAL
  Filled 2015-03-31 (×8): qty 1

## 2015-03-31 MED ORDER — VANCOMYCIN HCL 10 G IV SOLR
1250.0000 mg | Freq: Two times a day (BID) | INTRAVENOUS | Status: DC
  Administered 2015-04-01 – 2015-04-02 (×3): 1250 mg via INTRAVENOUS
  Filled 2015-03-31 (×4): qty 1250

## 2015-03-31 MED ORDER — PRENATAL 27-0.8 MG PO TABS
1.0000 | ORAL_TABLET | Freq: Every day | ORAL | Status: DC
  Administered 2015-04-01 – 2015-04-07 (×6): 1 via ORAL
  Filled 2015-03-31 (×8): qty 1

## 2015-03-31 MED ORDER — PIPERACILLIN-TAZOBACTAM 3.375 G IVPB
3.3750 g | Freq: Once | INTRAVENOUS | Status: AC
  Administered 2015-03-31: 3.375 g via INTRAVENOUS
  Filled 2015-03-31: qty 50

## 2015-03-31 MED ORDER — VITAMIN D 50 MCG (2000 UT) PO TABS: 2000.0000 [IU] | ORAL_TABLET | Freq: Every day | ORAL | Status: DC

## 2015-03-31 MED ORDER — QUETIAPINE FUMARATE 25 MG PO TABS
25.0000 mg | ORAL_TABLET | Freq: Every day | ORAL | Status: DC
  Administered 2015-03-31 – 2015-04-01 (×2): 25 mg via ORAL
  Filled 2015-03-31 (×3): qty 1

## 2015-03-31 MED ORDER — DIVALPROEX SODIUM ER 500 MG PO TB24
500.0000 mg | ORAL_TABLET | Freq: Every day | ORAL | Status: DC
  Administered 2015-03-31 – 2015-04-06 (×7): 500 mg via ORAL
  Filled 2015-03-31 (×8): qty 1

## 2015-03-31 MED ORDER — SODIUM CHLORIDE 0.9 % IV SOLN
1000.0000 mL | INTRAVENOUS | Status: DC
  Administered 2015-03-31: 1000 mL via INTRAVENOUS

## 2015-03-31 MED ORDER — VITAMIN D3 25 MCG (1000 UNIT) PO TABS
2000.0000 [IU] | ORAL_TABLET | Freq: Every day | ORAL | Status: DC
  Administered 2015-04-01 – 2015-04-07 (×7): 2000 [IU] via ORAL
  Filled 2015-03-31 (×8): qty 2

## 2015-03-31 MED ORDER — IPRATROPIUM-ALBUTEROL 0.5-2.5 (3) MG/3ML IN SOLN
3.0000 mL | Freq: Three times a day (TID) | RESPIRATORY_TRACT | Status: DC
  Administered 2015-04-01 – 2015-04-03 (×6): 3 mL via RESPIRATORY_TRACT
  Filled 2015-03-31 (×6): qty 3

## 2015-03-31 MED ORDER — IPRATROPIUM-ALBUTEROL 0.5-2.5 (3) MG/3ML IN SOLN
3.0000 mL | Freq: Four times a day (QID) | RESPIRATORY_TRACT | Status: DC
  Administered 2015-03-31: 3 mL via RESPIRATORY_TRACT
  Filled 2015-03-31: qty 3

## 2015-03-31 MED ORDER — FLUTICASONE PROPIONATE HFA 44 MCG/ACT IN AERO: 1.0000 | INHALATION_SPRAY | Freq: Two times a day (BID) | RESPIRATORY_TRACT | Status: DC

## 2015-03-31 MED ORDER — METOPROLOL SUCCINATE ER 100 MG PO TB24
100.0000 mg | ORAL_TABLET | Freq: Every day | ORAL | Status: DC
  Administered 2015-04-01 – 2015-04-06 (×5): 100 mg via ORAL
  Filled 2015-03-31 (×3): qty 1
  Filled 2015-03-31: qty 4
  Filled 2015-03-31 (×3): qty 1

## 2015-03-31 MED ORDER — DIVALPROEX SODIUM ER 250 MG PO TB24: 250.0000 mg | ORAL_TABLET | Freq: Two times a day (BID) | ORAL | Status: DC

## 2015-03-31 MED ORDER — FAMOTIDINE 20 MG PO TABS
20.0000 mg | ORAL_TABLET | Freq: Every day | ORAL | Status: DC
  Administered 2015-03-31 – 2015-04-07 (×8): 20 mg via ORAL
  Filled 2015-03-31 (×9): qty 1

## 2015-03-31 MED ORDER — PIPERACILLIN-TAZOBACTAM 3.375 G IVPB 30 MIN: 3.3750 g | Freq: Three times a day (TID) | INTRAVENOUS | Status: DC

## 2015-03-31 MED ORDER — SODIUM CHLORIDE 0.9 % IV SOLN
INTRAVENOUS | Status: DC
  Administered 2015-03-31 – 2015-04-01 (×2): via INTRAVENOUS

## 2015-03-31 MED ORDER — PIPERACILLIN-TAZOBACTAM 3.375 G IVPB
3.3750 g | Freq: Three times a day (TID) | INTRAVENOUS | Status: DC
Start: 2015-03-31 — End: 2015-04-05
  Administered 2015-03-31 – 2015-04-05 (×14): 3.375 g via INTRAVENOUS
  Filled 2015-03-31 (×14): qty 50

## 2015-03-31 MED ORDER — BUDESONIDE 0.25 MG/2ML IN SUSP
0.2500 mg | Freq: Two times a day (BID) | RESPIRATORY_TRACT | Status: DC
  Administered 2015-03-31 – 2015-04-07 (×12): 0.25 mg via RESPIRATORY_TRACT
  Filled 2015-03-31 (×15): qty 2

## 2015-03-31 MED ORDER — GUAIFENESIN-DM 100-10 MG/5ML PO SYRP: 5.0000 mL | ORAL_SOLUTION | ORAL | Status: DC | PRN

## 2015-03-31 MED ORDER — GUAIFENESIN ER 600 MG PO TB12
600.0000 mg | ORAL_TABLET | Freq: Two times a day (BID) | ORAL | Status: DC
  Administered 2015-03-31 – 2015-04-01 (×2): 600 mg via ORAL
  Filled 2015-03-31 (×4): qty 1

## 2015-03-31 MED ORDER — ALBUTEROL SULFATE (2.5 MG/3ML) 0.083% IN NEBU: 2.5000 mg | INHALATION_SOLUTION | Freq: Four times a day (QID) | RESPIRATORY_TRACT | Status: DC | PRN

## 2015-03-31 MED ORDER — CLONIDINE HCL 0.1 MG PO TABS
0.1000 mg | ORAL_TABLET | Freq: Every day | ORAL | Status: DC | PRN
  Filled 2015-03-31: qty 1

## 2015-03-31 MED ORDER — ALLOPURINOL 100 MG PO TABS
100.0000 mg | ORAL_TABLET | Freq: Every day | ORAL | Status: DC
  Administered 2015-04-01 – 2015-04-07 (×7): 100 mg via ORAL
  Filled 2015-03-31 (×8): qty 1

## 2015-03-31 MED ORDER — VANCOMYCIN HCL 10 G IV SOLR
1250.0000 mg | INTRAVENOUS | Status: AC
  Administered 2015-03-31: 1250 mg via INTRAVENOUS
  Filled 2015-03-31: qty 1250

## 2015-03-31 MED ORDER — ACETAMINOPHEN 500 MG PO TABS: 500.0000 mg | ORAL_TABLET | Freq: Three times a day (TID) | ORAL | Status: DC | PRN

## 2015-03-31 MED ORDER — AZELASTINE HCL 0.1 % NA SOLN: 1.0000 | Freq: Two times a day (BID) | NASAL | Status: DC | PRN

## 2015-03-31 MED ORDER — ENOXAPARIN SODIUM 40 MG/0.4ML ~~LOC~~ SOLN
40.0000 mg | SUBCUTANEOUS | Status: DC
  Administered 2015-03-31 – 2015-04-06 (×7): 40 mg via SUBCUTANEOUS
  Filled 2015-03-31 (×9): qty 0.4

## 2015-03-31 MED ORDER — LOSARTAN POTASSIUM 50 MG PO TABS
100.0000 mg | ORAL_TABLET | Freq: Every day | ORAL | Status: DC
  Administered 2015-04-01 – 2015-04-07 (×7): 100 mg via ORAL
  Filled 2015-03-31 (×8): qty 2

## 2015-03-31 NOTE — ED Notes (Signed)
Bed: ZO10 Expected date:  Expected time:  Means of arrival:  Comments: Elderly, cough, fever

## 2015-03-31 NOTE — ED Notes (Signed)
Doctor is performing ultra sound IV to collect blood samples

## 2015-03-31 NOTE — Progress Notes (Signed)
ANTIBIOTIC CONSULT NOTE - INITIAL  Pharmacy Consult for Vancomycin Indication: Pneumonia  Allergies  Allergen Reactions  . Bee Venom Anaphylaxis  . Dicyclomine Hcl Anaphylaxis  . Omeprazole Other (See Comments)     GI upset.  . Other Other (See Comments)    Reaction to reductase inhibitors per MAR  . Statins Other (See Comments)    myalgias    Patient Measurements: Height:  (165.1 cm) Weight: 220 lb (99.791 kg) IBW/kg (Calculated) : 57  Vital Signs: Temp: 99.5 F (37.5 C) (07/29 1149) Temp Source: Rectal (07/29 1149) BP: 140/84 mmHg (07/29 1421) Pulse Rate: 71 (07/29 1421) Intake/Output from previous day:   Intake/Output from this shift:    Labs:  Recent Labs  03/31/15 1220  WBC 5.7  HGB 12.1  PLT 247  CREATININE 0.94   Estimated Creatinine Clearance: 65.1 mL/min (by C-G formula based on Cr of 0.94). No results for input(s): VANCOTROUGH, VANCOPEAK, VANCORANDOM, GENTTROUGH, GENTPEAK, GENTRANDOM, TOBRATROUGH, TOBRAPEAK, TOBRARND, AMIKACINPEAK, AMIKACINTROU, AMIKACIN in the last 72 hours.   Microbiology: No results found for this or any previous visit (from the past 720 hour(s)).  Medical History: Past Medical History  Diagnosis Date  . Bipolar disorder     [name], NP  . Depression   . Hyperlipidemia   . Hypertension   . Hiatal hernia   . Esophageal reflux   . Other specified disorders of liver   . Headache(784.0)   . Hepatomegaly   . Other chronic nonalcoholic liver disease   . Cardiomegaly   . Irritable bowel syndrome   . Diverticulosis of colon (without mention of hemorrhage)   . COPD (chronic obstructive pulmonary disease)   . Nausea   . Family history of colon cancer   . Gout flare     Left toe  . Dementia   . Chronic headache   . Peripheral neuropathy   . PLMD (periodic limb movement disorder)     Assessment: 69 y/oF from [facility] with PMH of bipolar disorder, HTN, IBS, hepatomegaly, peripheral neuropathy  who presents to [facility] ED with confusion, fever, and cough. CXR shows mild right lower lobe atelectasis. Pharmacy consulted to dose Vancomycin for possible pneumonia.  7/29 >> Zosyn x 1 7/29 >> Vancomycin >>    7/29 blood x 2: sent 7/29 urine: sent   Tmax: 99.25F WBC WNL SCr 0.94 with CrCl ~ 65 ml/min  Goal of Therapy:  Vancomycin trough level 15-20 mcg/ml  Appropriate antibiotic dosing for renal function and indication Eradication of infection  Plan:   Vancomycin  IV q12h  Plan for Vancomycin trough level at steady state.  F/u plans to continue Zosyn if patient admitted.  Monitor renal function, cultures, clinical course.   Greer Pickerel, PharmD, BCPS Pager: [phone] 03/31/2015 2:43 PM

## 2015-03-31 NOTE — ED Notes (Signed)
Per ems pt from [facility], per staff pt is disoriented today and confused, and fever 100. Also co productive cough. Pt denies pain .

## 2015-03-31 NOTE — ED Provider Notes (Signed)
CSN: [id number]     Arrival date & time 03/31/15  1133 History   First MD Initiated Contact with Patient 03/31/15 1138     Chief Complaint  Patient presents with  . confusuin/ fever/cough      (Consider location/radiation/quality/duration/timing/severity/associated sxs/prior Treatment) Patient is a 69 y.o. female presenting with cough.  Cough Cough characteristics:  Productive Sputum characteristics:  Green and yellow Severity:  Moderate Onset quality:  Gradual Duration:  3 days Timing:  Constant Progression:  Worsening Chronicity:  New Relieved by:  Nothing Worsened by:  Nothing tried Ineffective treatments:  None tried Associated symptoms: fever (100.0 at facility)   Associated symptoms: no chest pain, no headaches, no rash, no shortness of breath, no sinus congestion, no sore throat and no wheezing     Past Medical History  Diagnosis Date  . Bipolar disorder     [name], NP  . Depression   . Hyperlipidemia   . Hypertension   . Hiatal hernia   . Esophageal reflux   . Other specified disorders of liver   . Headache(784.0)   . Hepatomegaly   . Other chronic nonalcoholic liver disease   . Cardiomegaly   . Irritable bowel syndrome   . Diverticulosis of colon (without mention of hemorrhage)   . COPD (chronic obstructive pulmonary disease)   . Nausea   . Family history of colon cancer   . Gout flare     Left toe  . Dementia   . Chronic headache   . Peripheral neuropathy   . PLMD (periodic limb movement disorder)   . Abscess of face 05/06/2014   Past Surgical History  Procedure Laterality Date  . Cholecystectomy    . Abdominal hysterectomy      partial  . Ovarian cyst removal    . Removal of cyst on chest wall    . Appendectomy     Family History  Problem Relation Age of Onset  . Emphysema Mother   . Heart disease Father   . Mental illness Father     Bipolar disorder  . Colon cancer Maternal Grandfather   . Alzheimer's disease Brother   . Bipolar  disorder Brother    History  Substance Use Topics  . Smoking status: Never Smoker   . Smokeless tobacco: Never Used  . Alcohol Use: No   OB History    No data available     Review of Systems  Constitutional: Positive for fever (100.0 at facility), activity change, appetite change and fatigue.  HENT: Negative for sore throat.   Eyes: Negative for visual disturbance.  Respiratory: Positive for cough. Negative for shortness of breath and wheezing.   Cardiovascular: Negative for chest pain.  Gastrointestinal: Negative for nausea, abdominal pain, diarrhea and constipation.  Genitourinary: Negative for difficulty urinating.  Musculoskeletal: Negative for back pain and neck pain.  Skin: Negative for rash.  Neurological: Negative for syncope and headaches.  Psychiatric/Behavioral: Positive for confusion.      Allergies  Bee venom; Dicyclomine hcl; Omeprazole; Other; and Statins  Home Medications   Prior to Admission medications   Medication Sig Start Date End Date Taking? Authorizing Provider  acetaminophen (TYLENOL) 500 MG tablet Take 500 mg by mouth every 8 (eight) hours as needed (pain).   Yes Historical Provider, MD  albuterol (VENTOLIN HFA) 108 (90 BASE) MCG/ACT inhaler Inhale 1 puff into the lungs every 6 (six) hours as needed for wheezing or shortness of breath (nocturnal coughing).   Yes Historical Provider, MD  allopurinol (ZYLOPRIM) 100 MG tablet Take 100 mg by mouth daily.   Yes Historical Provider, MD  azelastine (ASTELIN) 137 MCG/SPRAY nasal spray Place 1 spray into both nostrils 2 (two) times daily as needed for rhinitis. Use in each nostril as directed 12/20/11  Yes Maurice March, MD  Cholecalciferol (VITAMIN D) 2000 UNITS tablet Take 2,000 Units by mouth daily.   Yes Historical Provider, MD  cloNIDine (CATAPRES) 0.1 MG tablet Take 0.1 mg by mouth daily as needed (SBP >180).    Yes Historical Provider, MD  divalproex (DEPAKOTE ER) 250 MG 24 hr tablet Take 250-500  mg by mouth 2 (two) times daily. Pt takes one in the morning and two at night.   Yes Historical Provider, MD  donepezil (ARICEPT) 10 MG tablet Take 10 mg by mouth at bedtime.   Yes Historical Provider, MD  DULoxetine (CYMBALTA) 60 MG capsule Take 60 mg by mouth daily.   Yes Historical Provider, MD  ezetimibe (ZETIA) 10 MG tablet Take 10 mg by mouth daily.   Yes Historical Provider, MD  famotidine (PEPCID) 20 MG tablet Take 20 mg by mouth daily.   Yes Historical Provider, MD  fenofibrate 160 MG tablet Take 160 mg by mouth daily.   Yes Historical Provider, MD  fluticasone (FLOVENT HFA) 44 MCG/ACT inhaler Inhale 1 puff into the lungs 2 (two) times daily. Rinse mouth after use   Yes Historical Provider, MD  ipratropium-albuterol (DUONEB) 0.5-2.5 (3) MG/3ML SOLN Take 3 mLs by nebulization every 6 (six) hours. For shortness of breath 07/05/11  Yes Agapito Games, MD  LORazepam (ATIVAN) 0.5 MG tablet Take 0.25 mg by mouth at bedtime. May take 1 tablet every 8 hours as needed for anxiety.   Yes Historical Provider, MD  losartan (COZAAR) 100 MG tablet Take 100 mg by mouth daily.   Yes Historical Provider, MD  metoprolol succinate (TOPROL-XL) 100 MG 24 hr tablet Take 100 mg by mouth daily. Take with or immediately following a meal.   Yes Historical Provider, MD  polyethylene glycol (MIRALAX / GLYCOLAX) packet Take 17 g by mouth daily as needed for mild constipation or moderate constipation.    Yes Historical Provider, MD  Prenatal Vit-Fe Fumarate-FA (MULTIVITAMIN-PRENATAL) 27-0.8 MG TABS tablet Take 1 tablet by mouth daily at 12 noon. For anemia   Yes Historical Provider, MD  QUEtiapine (SEROQUEL) 25 MG tablet Take 25 mg by mouth at bedtime.   Yes Historical Provider, MD   BP 135/65 mmHg  Pulse 86  Temp(Src) 100.8 F (38.2 C) (Oral)  Resp 20  Ht 5\' 5"  (1.651 m)  Wt 220 lb (99.791 kg)  BMI 36.61 kg/m2  SpO2 100% Physical Exam  Constitutional: She appears well-developed and well-nourished. She  appears ill. No distress.  HENT:  Head: Normocephalic and atraumatic.  Mouth/Throat: No oropharyngeal exudate.  Eyes: Conjunctivae and EOM are normal. Pupils are equal, round, and reactive to light.  Neck: Normal range of motion.  Cardiovascular: Normal rate, regular rhythm, normal heart sounds and intact distal pulses.  Exam reveals no gallop and no friction rub.   No murmur heard. Pulmonary/Chest: Effort normal and breath sounds normal. No respiratory distress. She has no wheezes. She has no rales.  Abdominal: Soft. She exhibits no distension. There is no tenderness. There is no guarding.  Musculoskeletal: She exhibits no edema or tenderness.  Neurological: She is alert. She has normal strength. No cranial nerve deficit or sensory deficit. GCS eye subscore is 4. GCS verbal subscore is 4.  GCS motor subscore is 6.  Oriented to place and location Answers questions, however at times becomes confused or begins asking questions then forgets what she is asking. Waxing/waning mental status.  Skin: Skin is warm and dry. No rash noted. She is not diaphoretic. No erythema.  Nursing note and vitals reviewed.   ED Course  Procedures (including critical care time) Labs Review Labs Reviewed  COMPREHENSIVE METABOLIC PANEL - Abnormal; Notable for the following:    Sodium 133 (*)    Chloride 95 (*)    Alkaline Phosphatase 35 (*)    All other components within normal limits  CBC WITH DIFFERENTIAL/PLATELET - Abnormal; Notable for the following:    RDW 16.1 (*)    Monocytes Relative 18 (*)    All other components within normal limits  URINALYSIS, ROUTINE W REFLEX MICROSCOPIC (NOT AT [facility]) - Abnormal; Notable for the following:    Color, Urine AMBER (*)    APPearance CLOUDY (*)    Bilirubin Urine SMALL (*)    All other components within normal limits  CULTURE, EXPECTORATED SPUTUM-ASSESSMENT  CULTURE, BLOOD (ROUTINE X 2)  CULTURE, BLOOD (ROUTINE X 2)  URINE CULTURE  CULTURE, RESPIRATORY  (NON-EXPECTORATED)  STREP PNEUMONIAE URINARY ANTIGEN  LEGIONELLA ANTIGEN, URINE  I-STAT CG4 LACTIC ACID, ED  I-STAT CG4 LACTIC ACID, ED    Imaging Review Dg Chest 2 View  03/31/2015   CLINICAL DATA:  Sepsis  EXAM: CHEST  2 VIEW  COMPARISON:  05/14/2014  FINDINGS: Heart size is mildly enlarged.  Negative for heart failure.  Mild right lower lobe atelectasis. No definite pneumonia. Negative for effusion or mass.  IMPRESSION: Mild right lower lobe atelectasis.   Electronically Signed   By: Marlan Palau M.D.   On: 03/31/2015 13:52     EKG Interpretation None      MDM   Final diagnoses:  HCAP (healthcare-associated pneumonia)   69yo female with history of bipolar, hypertension, moderate dementia, COPD presents with concern of days of productive cough, with concern of altered mental status and elevated temp to 100 at nursing facility today.  No sign of UTI.  Pt hypoxic to 89 on room air.  CXR shows right sided opacities, and given clinical history and exam feel this likely represents pneumonia. Pt given vanc/zosyn for concern of HCAP.  Admitted to hospiltalist service in stable condition.     Alvira Monday, MD 03/31/15 2123

## 2015-03-31 NOTE — H&P (Signed)
History and Physical:    Deborah [name]   ZOX:[id number] DOB: 1945-09-15 DOA: 03/31/2015  Referring MD/provider: Dr. Dalene Seltzer PCP: [name], MD Dr. Myriam Jacobson  Chief Complaint: Cough and fever  History of Present Illness:   Deborah [name] is an 69 y.o. 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  The patient is a bit confused, and cannot provide a lot of details about her HPI, although she does endorse the recent onset of a productive cough, and is observed to be coughing up yellow/green sputum when I examine her.  The patient denies shortness of breath but has felt more fatigued than usual. She is wheelchair bound at baseline.  Spoke with the patient's son who tells me that the nursing home staff told him that his mother had been confused and weak for several days and that they were sending her to the ER for further evaluation. I reviewed the paperwork sent with her, but there is no documentation of her vital signs.  ROS:   Review of Systems  Constitutional: Positive for fever, chills and malaise/fatigue.  HENT: Negative.   Eyes: Negative.   Respiratory: Positive for cough and sputum production. Negative for hemoptysis, shortness of breath and wheezing.   Cardiovascular: Negative for chest pain.  Gastrointestinal: Negative.   Genitourinary: Negative.   Musculoskeletal: Negative.   Skin: Negative.   Neurological: Positive for weakness. Negative for loss of consciousness.  Psychiatric/Behavioral: The patient is nervous/anxious.      Past Medical History:   Past Medical History  Diagnosis Date  . Bipolar disorder     [name], NP  . Depression   . Hyperlipidemia   . Hypertension   . Hiatal hernia   . Esophageal reflux   . Other specified disorders of liver   . Headache(784.0)   .  Hepatomegaly   . Other chronic nonalcoholic liver disease   . Cardiomegaly   . Irritable bowel syndrome   . Diverticulosis of colon (without mention of hemorrhage)   . COPD (chronic obstructive pulmonary disease)   . Nausea   . Family history of colon cancer   . Gout flare     Left toe  . Dementia   . Chronic headache   . Peripheral neuropathy   . PLMD (periodic limb movement disorder)   . Abscess of face 05/06/2014    Past Surgical History:   Past Surgical History  Procedure Laterality Date  . Cholecystectomy    . Abdominal hysterectomy      partial  . Ovarian cyst removal    . Removal of cyst on chest wall    . Appendectomy      Social History:   History   Social History  . Marital Status: Widowed    Spouse Name: N/A  . Number of Children: 5  . Years of Education: N/A   Occupational History  . Retired     Administrator, sports   Social History Main Topics  . Smoking status: Never Smoker   . Smokeless tobacco: Never Used  . Alcohol Use: No  . Drug Use: No  . Sexual Activity: No   Other Topics Concern  . Not on file   Social History Narrative   Widowed.  SNF resident.  Wheelchair bound.      Very little caffeine but will drink  it.     Family history:   Family History  Problem Relation Age of Onset  . Emphysema Mother   . Heart disease Father   . Mental illness Father     Bipolar disorder  . Colon cancer Maternal Grandfather   . Alzheimer's disease Brother   . Bipolar disorder Brother     Allergies   Bee venom; Dicyclomine hcl; Omeprazole; Other; and Statins  Current Medications:   Prior to Admission medications   Medication Sig Start Date End Date Taking? Authorizing Provider  acetaminophen (TYLENOL) 500 MG tablet Take 500 mg by mouth every 8 (eight) hours as needed (pain).   Yes Historical Provider, MD  albuterol (VENTOLIN HFA) 108 (90 BASE) MCG/ACT inhaler Inhale 1 puff into the lungs every 6 (six) hours as needed for wheezing or shortness of  breath (nocturnal coughing).   Yes Historical Provider, MD  allopurinol (ZYLOPRIM) 100 MG tablet Take 100 mg by mouth daily.   Yes Historical Provider, MD  azelastine (ASTELIN) 137 MCG/SPRAY nasal spray Place 1 spray into both nostrils 2 (two) times daily as needed for rhinitis. Use in each nostril as directed 12/20/11  Yes Maurice March, MD  Cholecalciferol (VITAMIN D) 2000 UNITS tablet Take 2,000 Units by mouth daily.   Yes Historical Provider, MD  cloNIDine (CATAPRES) 0.1 MG tablet Take 0.1 mg by mouth daily as needed (SBP >180).    Yes Historical Provider, MD  divalproex (DEPAKOTE ER) 250 MG 24 hr tablet Take 250-500 mg by mouth 2 (two) times daily. Pt takes one in the morning and two at night.   Yes Historical Provider, MD  donepezil (ARICEPT) 10 MG tablet Take 10 mg by mouth at bedtime.   Yes Historical Provider, MD  DULoxetine (CYMBALTA) 60 MG capsule Take 60 mg by mouth daily.   Yes Historical Provider, MD  ezetimibe (ZETIA) 10 MG tablet Take 10 mg by mouth daily.   Yes Historical Provider, MD  famotidine (PEPCID) 20 MG tablet Take 20 mg by mouth daily.   Yes Historical Provider, MD  fenofibrate 160 MG tablet Take 160 mg by mouth daily.   Yes Historical Provider, MD  fluticasone (FLOVENT HFA) 44 MCG/ACT inhaler Inhale 1 puff into the lungs 2 (two) times daily. Rinse mouth after use   Yes Historical Provider, MD  ipratropium-albuterol (DUONEB) 0.5-2.5 (3) MG/3ML SOLN Take 3 mLs by nebulization every 6 (six) hours. For shortness of breath 07/05/11  Yes Agapito Games, MD  LORazepam (ATIVAN) 0.5 MG tablet Take 0.25 mg by mouth at bedtime. May take 1 tablet every 8 hours as needed for anxiety.   Yes Historical Provider, MD  losartan (COZAAR) 100 MG tablet Take 100 mg by mouth daily.   Yes Historical Provider, MD  metoprolol succinate (TOPROL-XL) 100 MG 24 hr tablet Take 100 mg by mouth daily. Take with or immediately following a meal.   Yes Historical Provider, MD  polyethylene glycol  (MIRALAX / GLYCOLAX) packet Take 17 g by mouth daily as needed for mild constipation or moderate constipation.    Yes Historical Provider, MD  Prenatal Vit-Fe Fumarate-FA (MULTIVITAMIN-PRENATAL) 27-0.8 MG TABS tablet Take 1 tablet by mouth daily at 12 noon. For anemia   Yes Historical Provider, MD  QUEtiapine (SEROQUEL) 25 MG tablet Take 25 mg by mouth at bedtime.   Yes Historical Provider, MD    Physical Exam:   Filed Vitals:   03/31/15 1421 03/31/15 1422 03/31/15 1456 03/31/15 1501  BP: 140/84  128/74 128/74  Pulse: 71  69 69  Temp:      TempSrc:      Resp: Height:   (1.651 m)    Weight:      SpO2: 100%   94%     Physical Exam: Blood pressure 128/74, pulse 69, temperature 99.5 F (37.5 C), temperature source Rectal, resp. rate 24, height  (1.651 m), weight 99.791 kg (220 lb), SpO2 94 %. Gen: No acute distress. Weak. Head: Normocephalic, atraumatic. Eyes: PERRL, EOMI, sclerae nonicteric. Mouth: Oropharynx clear. Neck: Supple, no thyromegaly, no lymphadenopathy, no jugular venous distention. Chest: Lungs diminished. Faint high-pitched expiratory wheezes on the right. Coughing up yellow/green thick sputum. CV: Heart sounds are regular. No M/R/G. Abdomen: Soft, nontender, nondistended with normal active bowel sounds. Extremities: Extremities are without C/E/C. Skin: Warm and dry. Neuro: Confused; grossly intact. Psych: Mood and affect flat.   Data Review:    Labs: Basic Metabolic Panel:  Recent Labs Lab 03/31/15 1220  NA 133*  K 3.8  CL 95*  CO2 29  GLUCOSE 94  BUN 16  CREATININE 0.94  CALCIUM 9.2   Liver Function Tests:  Recent Labs Lab 03/31/15 1220  AST 31  ALT 18  ALKPHOS 35*  BILITOT 0.9  PROT 7.2  ALBUMIN 3.5   CBC:  Recent Labs Lab 03/31/15 1220  WBC 5.7  NEUTROABS 3.4  HGB 12.1  HCT 36.8  MCV 86.0  PLT 247   Radiographic Studies: Dg Chest 2 View  03/31/2015   CLINICAL DATA:  Sepsis  EXAM: CHEST  2 VIEW   COMPARISON:  05/14/2014  FINDINGS: Heart size is mildly enlarged.  Negative for heart failure.  Mild right lower lobe atelectasis. No definite pneumonia. Negative for effusion or mass.  IMPRESSION: Mild right lower lobe atelectasis.   Electronically Signed   By: Marlan Palau M.D.   On: 03/31/2015 13:52   *I have personally reviewed the images above*    Assessment/Plan:   Principal Problem:   HCAP (healthcare-associated pneumonia) - Patient is a SNF resident, so will place her on empiric vancomycin/Zosyn. - Obtain blood cultures and sputum culture. - Check strep pneumonia and legionella urinary antigens. - Provide broncodilators as needed. Mucinex and Robitussin as needed has also been ordered. - Narrow antibiotics as culture data returns in the face of clinical stability.  Active Problems:   Bipolar disorder - Continue Depakote, Seroquel and Cymbalta.    Essential hypertension - Continue clonidine, metoprolol and Cozaar.    Moderate dementia - Continue Aricept.  DVT prophylaxis - Lovenox ordered.  Code Status: Full. Family Communication: Updated Deborah [name] (son) and Deborah [name] (daughter) by telephone. Disposition Plan: Anticipate three-day hospital stay with discharge back to SNF when stable.  Time spent: One hour.  Paislee Szatkowski [facility] Pager [phone] Cell: [phone]   If 7PM-7AM, please contact night-coverage [email or web address] Password [facility] 03/31/2015, 4:19 PM

## 2015-04-01 LAB — AMMONIA: AMMONIA: 20 umol/L (ref 9–35)

## 2015-04-01 MED ORDER — ACETAMINOPHEN 500 MG PO TABS
500.0000 mg | ORAL_TABLET | Freq: Three times a day (TID) | ORAL | Status: DC | PRN
  Administered 2015-04-01: 500 mg via ORAL
  Filled 2015-04-01: qty 1

## 2015-04-01 MED ORDER — GUAIFENESIN ER 600 MG PO TB12
1200.0000 mg | ORAL_TABLET | Freq: Two times a day (BID) | ORAL | Status: DC
  Administered 2015-04-01 – 2015-04-07 (×12): 1200 mg via ORAL
  Filled 2015-04-01 (×13): qty 2

## 2015-04-01 MED ORDER — SODIUM CHLORIDE 0.9 % IV SOLN
INTRAVENOUS | Status: DC
  Administered 2015-04-01: 16:00:00 via INTRAVENOUS

## 2015-04-01 NOTE — Progress Notes (Signed)
PROGRESS NOTE    Deborah [name]:[id number] DOB: 08-10-1946 DOA: 03/31/2015 PCP: [name], MD  HPI/Brief narrative 69 y.o. 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 Patient was hospitalized for possible HCAP.   Assessment/Plan:  Principal Problem:  HCAP (healthcare-associated pneumonia) RLL Vs Acute purulent bronchtis - Patient is a SNF resident, so placed on empiric vancomycin/Zosyn. - Blood cultures 2: Negative to date. Sputum culture: Pending - Urine pneumococcal antigen: Negative - Aggressive pulmonary toilet. - Narrow antibiotics as culture data returns in the face of clinical stability. - Given history of SDH and confusion, will get speech therapy to evaluate swallowing for dysphagia. - Spiked fever off 103F this afternoon.  Active Problems:  Bipolar disorder - Continue Depakote, Seroquel and Cymbalta.   Essential hypertension - Continue clonidine, metoprolol and Cozaar. - Controlled   Moderate dementia - Continue Aricept.  Acute encephalopathy - Most likely secondary to acute illness complicating underlying dementia and bipolar disorder. No focal deficits seen. - will check ammonia levels.    DVT prophylaxis: - Lovenox Code Status: Full. Family Communication: None at bedside today Disposition Plan: DC to SNF when medically stable, possibly the next 2 days.   Consultants:  None  Procedures:  None  Antibiotics:  IV vancomycin 7/29 >  IV Zosyn 7/29 >  Subjective: productive cough. No dyspnea or chest pain. Seems slightly confused. Baseline mental status not clear.  Objective: Filed Vitals:   04/01/15 0447 04/01/15 0947 04/01/15 1446 04/01/15 1500  BP: 109/80 104/63  128/55  Pulse: 78 83  85  Temp: 99.7 F (37.6 C)   103.1 F (39.5 C)  TempSrc: Axillary   Oral  Resp: 20   20  Height:      Weight:      SpO2: 97%  94% 96%    Intake/Output Summary (Last 24 hours) at 04/01/15 1502 Last data filed at 04/01/15 0600  Gross per 24 hour  Intake    650 ml  Output      0 ml  Net    650 ml   Filed Weights   03/31/15 1208  Weight: 99.791 kg (220 lb)     Exam:  General exam: Moderately built and obese pleasant elderly female lying propped up in bed without distress. Respiratory system: Reduced breath sounds in the bases with occasional basal crackles. Otherwise clear to auscultation. No increased work of breathing. Cardiovascular system: S1 & S2 heard, RRR. No JVD, murmurs, gallops, clicks or pedal edema. Non-telemetry Gastrointestinal system: Abdomen is nondistended, soft and nontender. Normal bowel sounds heard. Central nervous system: Alert and oriented only to self and partly to place. No focal neurological deficits. Extremities: Symmetric 5 x 5 power.   Data Reviewed: Basic Metabolic Panel:  Recent Labs Lab 03/31/15 1220  NA 133*  K 3.8  CL 95*  CO2 29  GLUCOSE 94  BUN 16  CREATININE 0.94  CALCIUM 9.2   Liver Function Tests:  Recent Labs Lab 03/31/15 1220  AST 31  ALT 18  ALKPHOS 35*  BILITOT 0.9  PROT 7.2  ALBUMIN 3.5   No results for input(s): LIPASE, AMYLASE in the last 168 hours. No results for input(s): AMMONIA in the last 168 hours. CBC:  Recent Labs Lab 03/31/15 1220  WBC 5.7  NEUTROABS 3.4  HGB 12.1  HCT 36.8  MCV 86.0  PLT 247   Cardiac Enzymes: No results for input(s): CKTOTAL, CKMB, CKMBINDEX, TROPONINI in the last 168 hours. BNP (last 3 results) No results for input(s): PROBNP in the last 8760 hours. CBG: No results for input(s): GLUCAP in the last 168 hours.  Recent Results (from the past 240 hour(s))  Culture, blood  (routine x 2)     Status: None (Preliminary result)   Collection Time: 03/31/15 12:10 PM  Result Value Ref Range Status   Specimen Description BLOOD RIGHT ANTECUBITAL  Final   Special Requests BOTTLES DRAWN AEROBIC AND ANAEROBIC 5CC  Final   Culture   Final    NO GROWTH < 24 HOURS Performed at [facility]    Report Status PENDING  Incomplete  Culture, blood (routine x 2)     Status: None (Preliminary result)   Collection Time: 03/31/15 12:13 PM  Result Value Ref Range Status   Specimen Description BLOOD RIGHT FOREARM  Final   Special Requests BOTTLES DRAWN AEROBIC AND ANAEROBIC  Final   Culture   Final    NO GROWTH < 24 HOURS Performed at [facility]    Report Status PENDING  Incomplete  Urine culture     Status: None (Preliminary result)   Collection Time: 03/31/15 12:38 PM  Result Value Ref Range Status   Specimen Description URINE, CLEAN CATCH  Final   Special Requests NONE  Final   Culture   Final    CULTURE REINCUBATED FOR BETTER GROWTH Performed at [facility]    Report Status PENDING  Incomplete  Culture, sputum-assessment     Status: None   Collection Time: 03/31/15  4:13 PM  Result Value Ref Range Status   Specimen Description Expect. Sput  Final   Special Requests Normal  Final   Sputum evaluation   Final    THIS SPECIMEN IS ACCEPTABLE. RESPIRATORY CULTURE REPORT TO FOLLOW.   Report Status 03/31/2015 FINAL  Final           Studies: Dg Chest 2 View  03/31/2015   CLINICAL DATA:  Sepsis  EXAM: CHEST  2 VIEW  COMPARISON:  05/14/2014  FINDINGS: Heart size is mildly enlarged.  Negative for heart failure.  Mild right lower lobe atelectasis. No definite pneumonia. Negative for effusion or mass.  IMPRESSION: Mild right lower lobe atelectasis.   Electronically Signed   By: Marlan Palau M.D.   On: 03/31/2015 13:52        Scheduled Meds: . allopurinol  100 mg Oral Daily  . budesonide (PULMICORT) nebulizer solution  0.25 mg  Nebulization BID  . cholecalciferol  2,000 Units Oral Daily  . divalproex  250 mg Oral Daily  . divalproex  500 mg Oral QHS  . donepezil  10 mg Oral QHS  . DULoxetine  60 mg Oral Daily  . enoxaparin (LOVENOX) injection  40 mg Subcutaneous Q24H  . ezetimibe  10 mg Oral Daily  . famotidine  20 mg Oral Daily  . fenofibrate  160 mg Oral Daily  . guaiFENesin  600 mg Oral BID  . ipratropium-albuterol  3 mL Nebulization TID  . losartan  100 mg Oral Daily  . metoprolol succinate  100 mg Oral Daily  . multivitamin-prenatal  1 tablet Oral Q1200  . piperacillin-tazobactam (ZOSYN)  IV  3.375 g Intravenous Q8H  . QUEtiapine  25 mg Oral QHS  .  vancomycin  1,250 mg Intravenous Q12H   Continuous Infusions: . sodium chloride 1,000 mL (03/31/15 1216)  . sodium chloride 75 mL/hr at 04/01/15 1610    Principal Problem:   HCAP (healthcare-associated pneumonia) Active Problems:   Bipolar disorder   Essential hypertension   Moderate dementia    Time spent: 40 minutes.    Marcellus Scott, MD, FACP, FHM. [facility] Pager [phone]  If 7PM-7AM, please contact night-coverage [email or web address] Password [email or web address] 04/01/2015, 3:02 PM    LOS: 1 day

## 2015-04-01 NOTE — Progress Notes (Signed)
UR Completed. Kobi Mario, RN, BSN.  [phone] 

## 2015-04-01 NOTE — Clinical Documentation Improvement (Signed)
Possible Conditions:   - Suspected or Known Aspiration Pneumonia  - HCAP, as currently documented without further clarification  - Other type of Pneumonia  - Unable to clinically determine  Clinical Information: Documented history of ICH, Dementia, Esophageal Reflux and Hiatal Hernia.  CXR 03/31/15 showing right lower lobe atelectasis.  Current treatment with IV Vancomycin and Zosyn.  WBC 5.7 on admission. Reported fever of 100 at nursing facility prior to admission.     Thank You, Jerral Ralph ,RN Clinical Documentation Specialist:  [phone] [facility]- Health Information Management

## 2015-04-01 NOTE — Evaluation (Signed)
Clinical/Bedside Swallow Evaluation Patient Details  Name: Deborah [name] MRN: [id number] Date of Birth: 08/04/1946  Today's Date: 04/01/2015 Time: SLP Start Time (ACUTE ONLY): 1601 SLP Stop Time (ACUTE ONLY): 1622 SLP Time Calculation (min) (ACUTE ONLY): 21 min  Past Medical History:  Past Medical History  Diagnosis Date  . Bipolar disorder     [name], NP  . Depression   . Hyperlipidemia   . Hypertension   . Hiatal hernia   . Esophageal reflux   . Other specified disorders of liver   . Headache(784.0)   . Hepatomegaly   . Other chronic nonalcoholic liver disease   . Cardiomegaly   . Irritable bowel syndrome   . Diverticulosis of colon (without mention of hemorrhage)   . COPD (chronic obstructive pulmonary disease)   . Nausea   . Family history of colon cancer   . Gout flare     Left toe  . Dementia   . Chronic headache   . Peripheral neuropathy   . PLMD (periodic limb movement disorder)   . Abscess of face 05/06/2014   Past Surgical History:  Past Surgical History  Procedure Laterality Date  . Cholecystectomy    . Abdominal hysterectomy      partial  . Ovarian cyst removal    . Removal of cyst on chest wall    . Appendectomy     HPI:  Deborah [name] is an 69 y.o. 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 The patient is a bit confused, and cannot provide a lot of details about her HPI, although she does endorse the recent onset of a productive cough, and is observed to be coughing up yellow/green sputum. Chest x ray revealed mid right lower lobe atelectasis   Assessment / Plan / Recommendation Clinical Impression  Pt very lethargic and required consistent cueing throughout bedside swallow evaluation. No history of dyshagia known, current deficits felt to be secondary to decreased mentation and  worsening acute cognitive changes. This directly affecting mastication of more solid POs. Noted prolonged mastication, oral residue, and decreased coordination of bolus. Despite fatigue pt appeared to be adequately protecting her airway. No overt signs or symptoms of aspiration. Recommend dysphagia 2 (chopped) diet with thin liquids. Full supervision and assistance with feeding. Medicines crushed with puree. Anticipate as mentation improves pt will be able to tolerate more solid POs. ST follow up warranted.     Aspiration Risk  Mild    Diet Recommendation Dysphagia 2 (Fine chop);Thin   Medication Administration: Crushed with puree Compensations: Small sips/bites;Slow rate;Follow solids with liquid;Minimize environmental distractions    Other  Recommendations Oral Care Recommendations: Oral care BID   Follow Up Recommendations       Frequency and Duration min 2x/week  2 weeks   Pertinent Vitals/Pain     SLP Swallow Goals     Swallow Study Prior Functional Status       General Date of Onset: 03/31/15 Other Pertinent Information: Deborah [name] is an 69 y.o. 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 The patient is a bit confused, and cannot provide a lot of details about her HPI, although she does endorse the  recent onset of a productive cough, and is observed to be coughing up yellow/green sputum. Chest x ray revealed mid right lower lobe atelectasis Type of Study: Bedside swallow evaluation Diet Prior to this Study: Regular;Thin liquids Temperature Spikes Noted: Yes Respiratory Status: Supplemental O2 delivered via (comment) History of Recent Intubation: No Behavior/Cognition: Lethargic/Drowsy;Requires cueing;Doesn't follow directions Oral Cavity - Dentition: Poor condition;Missing dentition Self-Feeding  Abilities: Needs assist Patient Positioning: Upright in bed Baseline Vocal Quality: Low vocal intensity Volitional Cough: Cognitively unable to elicit Volitional Swallow: Unable to elicit    Oral/Motor/Sensory Function Overall Oral Motor/Sensory Function: Appears within functional limits for tasks assessed   Ice Chips Ice chips: Impaired Oral Phase Impairments: Reduced lingual movement/coordination Oral Phase Functional Implications: Prolonged oral transit;Oral holding Pharyngeal Phase Impairments: Suspected delayed Swallow   Thin Liquid Thin Liquid: Impaired Presentation: Cup Oral Phase Impairments: Reduced lingual movement/coordination Oral Phase Functional Implications: Prolonged oral transit;Oral holding Pharyngeal  Phase Impairments: Suspected delayed Swallow    Nectar Thick Nectar Thick Liquid: Not tested   Honey Thick Honey Thick Liquid: Not tested   Puree Puree: Within functional limits   Solid   GO    Solid: Impaired Oral Phase Impairments: Impaired anterior to posterior transit;Reduced lingual movement/coordination Oral Phase Functional Implications: Oral residue;Oral holding Pharyngeal Phase Impairments: Suspected delayed Swallow;Multiple swallows      Marcene Duos MA, CCC-SLP Acute Care Speech Language Pathologist    Marcene Duos E 04/01/2015,4:20 PM

## 2015-04-02 ENCOUNTER — Inpatient Hospital Stay (HOSPITAL_COMMUNITY): Payer: Medicare Other

## 2015-04-02 DIAGNOSIS — D649 Anemia, unspecified: Secondary | ICD-10-CM

## 2015-04-02 DIAGNOSIS — R1310 Dysphagia, unspecified: Secondary | ICD-10-CM

## 2015-04-02 LAB — BASIC METABOLIC PANEL
ANION GAP: 10 (ref 5–15)
BUN: 12 mg/dL (ref 6–20)
CALCIUM: 8.5 mg/dL — AB (ref 8.9–10.3)
CO2: 26 mmol/L (ref 22–32)
Chloride: 98 mmol/L — ABNORMAL LOW (ref 101–111)
Creatinine, Ser: 1.01 mg/dL — ABNORMAL HIGH (ref 0.44–1.00)
GFR calc Af Amer: >60 mL/min (ref >60)
GFR calc non Af Amer: 55 mL/min — ABNORMAL LOW (ref >60)
Glucose, Bld: 87 mg/dL (ref 65–99)
Potassium: 3.6 mmol/L (ref 3.5–5.1)
SODIUM: 134 mmol/L — AB (ref 135–145)

## 2015-04-02 LAB — VANCOMYCIN, TROUGH: Vancomycin Tr: 21 ug/mL — ABNORMAL HIGH (ref 10.0–20.0)

## 2015-04-02 LAB — CBC
HCT: 32.1 % — ABNORMAL LOW (ref 36.0–46.0)
Hemoglobin: 10.3 g/dL — ABNORMAL LOW (ref 12.0–15.0)
MCH: 27.4 pg (ref 26.0–34.0)
MCHC: 32.1 g/dL (ref 30.0–36.0)
MCV: 85.4 fL (ref 78.0–100.0)
Platelets: 192 10*3/uL (ref 150–400)
RBC: 3.76 MIL/uL — ABNORMAL LOW (ref 3.87–5.11)
RDW: 15.9 % — AB (ref 11.5–15.5)
WBC: 4.8 10*3/uL (ref 4.0–10.5)

## 2015-04-02 LAB — URINE CULTURE

## 2015-04-02 MED ORDER — ENSURE ENLIVE PO LIQD
237.0000 mL | Freq: Two times a day (BID) | ORAL | Status: DC
  Administered 2015-04-02 – 2015-04-07 (×7): 237 mL via ORAL

## 2015-04-02 MED ORDER — QUETIAPINE 12.5 MG HALF TABLET
12.5000 mg | ORAL_TABLET | Freq: Every day | ORAL | Status: DC
  Administered 2015-04-02 – 2015-04-06 (×5): 12.5 mg via ORAL
  Filled 2015-04-02 (×6): qty 1

## 2015-04-02 MED ORDER — VANCOMYCIN HCL IN DEXTROSE 1-5 GM/200ML-% IV SOLN
1000.0000 mg | Freq: Two times a day (BID) | INTRAVENOUS | Status: DC
  Administered 2015-04-02 – 2015-04-04 (×4): 1000 mg via INTRAVENOUS
  Filled 2015-04-02 (×5): qty 200

## 2015-04-02 MED ORDER — ACETAMINOPHEN 325 MG PO TABS
650.0000 mg | ORAL_TABLET | Freq: Four times a day (QID) | ORAL | Status: DC | PRN
  Administered 2015-04-02 – 2015-04-03 (×3): 650 mg via ORAL
  Filled 2015-04-02 (×3): qty 2

## 2015-04-02 MED ORDER — DULOXETINE HCL 20 MG PO CPEP
40.0000 mg | ORAL_CAPSULE | Freq: Every day | ORAL | Status: DC
  Administered 2015-04-02 – 2015-04-07 (×6): 40 mg via ORAL
  Filled 2015-04-02 (×7): qty 2

## 2015-04-02 NOTE — Progress Notes (Signed)
ANTIBIOTIC CONSULT NOTE - Follow Up  Pharmacy Consult for Vancomycin Indication: Pneumonia  Allergies  Allergen Reactions  . Bee Venom Anaphylaxis  . Dicyclomine Hcl Anaphylaxis  . Omeprazole Other (See Comments)     GI upset.  . Other Other (See Comments)    Reaction to reductase inhibitors per MAR  . Statins Other (See Comments)    myalgias    Patient Measurements: Height:  (165.1 cm) Weight: 220 lb (99.791 kg) IBW/kg (Calculated) : 57  Vital Signs: Temp: 99.6 F (37.6 C) (07/31 1258) Temp Source: Oral (07/31 1258) BP: 134/63 mmHg (07/31 0551) Pulse Rate: 86 (07/31 0551) Intake/Output from previous day: 07/30 0701 - 07/31 0700 In: 1720 [P.O.:445; I.V.:1275] Out: -  Intake/Output from this shift:    Labs:  Recent Labs  03/31/15 1220 04/02/15 0510  WBC 5.7 4.8  HGB 12.1 10.3*  PLT 247 192  CREATININE 0.94 1.01*   Estimated Creatinine Clearance: 60.6 mL/min (by C-G formula based on Cr of 1.01).  Recent Labs  04/02/15 1254  VANCOTROUGH 21*     Microbiology: Recent Results (from the past 720 hour(s))  Culture, blood (routine x 2)     Status: None (Preliminary result)   Collection Time: 03/31/15 12:10 PM  Result Value Ref Range Status   Specimen Description BLOOD RIGHT ANTECUBITAL  Final   Special Requests BOTTLES DRAWN AEROBIC AND ANAEROBIC 5CC  Final   Culture   Final    NO GROWTH 2 DAYS Performed at [facility]    Report Status PENDING  Incomplete  Culture, blood (routine x 2)     Status: None (Preliminary result)   Collection Time: 03/31/15 12:13 PM  Result Value Ref Range Status   Specimen Description BLOOD RIGHT FOREARM  Final   Special Requests BOTTLES DRAWN AEROBIC AND ANAEROBIC  Final   Culture   Final    NO GROWTH 2 DAYS Performed at [facility]    Report Status PENDING  Incomplete  Urine culture     Status: None   Collection Time: 03/31/15 12:38 PM  Result Value Ref Range Status   Specimen Description  URINE, CLEAN CATCH  Final   Special Requests NONE  Final   Culture   Final    MULTIPLE SPECIES PRESENT, SUGGEST RECOLLECTION Performed at [facility]    Report Status 04/02/2015 FINAL  Final  Culture, sputum-assessment     Status: None   Collection Time: 03/31/15  4:13 PM  Result Value Ref Range Status   Specimen Description Expect. Sput  Final   Special Requests Normal  Final   Sputum evaluation   Final    THIS SPECIMEN IS ACCEPTABLE. RESPIRATORY CULTURE REPORT TO FOLLOW.   Report Status 03/31/2015 FINAL  Final    Assessment: 69 y/oF from [facility] with PMH of bipolar disorder, HTN, IBS, hepatomegaly, peripheral neuropathy who presents to [facility] ED with confusion, fever, and cough. CXR shows mild right lower lobe atelectasis. Pharmacy consulted to dose Vancomycin for possible pneumonia.  7/29 >> Zosyn >> 7/29 >> Vancomycin >>   7/29 blood x 2: ngtd 7/29 urine: multiple species, suggest recollection 7/29 sputum: IP  S. pneumo UAg: neg Legionella UAg: IP  Today, 04/02/2015 Day #3 antibiotics  Tmax: 103.1 WBC WNL CXR LLL consolidation worse SCr slightly increased, 0.94 > 1.01 with CrCl 60 ml/min  Dose changes/levels: 7/31 VT at 13:00 = 21 before 5th dose of  q12h  Goal of Therapy:  Vancomycin trough level  15-20 mcg/ml  Appropriate antibiotic dosing for renal function and indication Eradication of infection  Plan:   Decrease Vancomycin to 1g q12h  Recheck trough at new steady state if continues  Continue Zosyn 3.375gm IV q8h (4hr extended infusions) per MD  Continue to monitor renal function and adjust doses as needed  Loralee Pacas, PharmD, BCPS Pager: [phone] 03/31/2015 2:43 PM

## 2015-04-02 NOTE — Progress Notes (Addendum)
PROGRESS NOTE    Deborah [name]:[id number] DOB: 1945-11-27 DOA: 03/31/2015 PCP: [name], MD  HPI/Brief narrative 69 y.o. 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 Patient was hospitalized for possible HCAP.   Assessment/Plan:  Principal Problem:  HCAP (healthcare-associated pneumonia) LLL Vs Aspiration PNA - Patient is a SNF resident, so placed on empiric vancomycin/Zosyn. - Blood cultures 2: Negative to date. Sputum culture: Pending - Urine pneumococcal antigen: Negative - Aggressive pulmonary toilet. - Narrow antibiotics as culture data returns in the face of clinical stability. - Speech therapy evaluation appreciated: Diet as below. - Repeat chest x-ray confirms LLL pneumonia, worse than previous x-ray   Active Problems:  Bipolar disorder - Continue Depakote, Seroquel and Cymbalta. - Patient appears slightly somnolent. Ammonia level normal. Marginally reduced Seroquel and Cymbalta doses. Monitor closely.   Essential hypertension - Continue clonidine, metoprolol and Cozaar. - Controlled   Moderate dementia - Continue Aricept.  Acute encephalopathy - Most likely secondary to acute illness complicating underlying dementia and bipolar disorder. No focal deficits seen. - Ammonia levels: 20 - Reduce Seroquel and Cymbalta doses marginally.  Dysphagia - Speech therapy input appreciated. Started on dysphagia 2 diet and thin liquids.  Anemia - ? Secondary to hemodilution versus critical illness. No reported bleeding. Follow CBC in a.m.    DVT prophylaxis: - Lovenox Code Status: Full. Family Communication: None at  bedside today. Left message for daughter Ms. [name]. Disposition Plan: DC to SNF when medically stable, possibly the next 2 - 3 days.   Consultants:  None  Procedures:  None  Antibiotics:  IV vancomycin 7/29 >  IV Zosyn 7/29 >  Subjective: "I am tired". Eyes closed but answering questions-some of them appropriate. As per nursing, continues to have productive cough. No dyspnea or chest pain reported.  Objective: Filed Vitals:   04/01/15 1953 04/01/15 2128 04/02/15 0551 04/02/15 0853  BP:  132/55 134/63   Pulse:  74 86   Temp:  99.1 F (37.3 C) 101.2 F (38.4 C)   TempSrc:  Oral Oral   Resp:  20 20   Height:      Weight:      SpO2: 97% 97% 94% 94%    Intake/Output Summary (Last 24 hours) at 04/02/15 1010 Last data filed at 04/02/15 0600  Gross per 24 hour  Intake   1520 ml  Output      0 ml  Net   1520 ml   Filed Weights   03/31/15 1208  Weight: 99.791 kg (220 lb)     Exam:  General exam: Moderately built and obese pleasant elderly female lying propped up in bed without distress. Respiratory system: Reduced breath sounds in the bases with occasional basal crackles. Otherwise clear to auscultation. No increased work of breathing. Cardiovascular system: S1 & S2 heard, RRR. No JVD, murmurs, gallops, clicks or pedal edema. Non-telemetry Gastrointestinal system: Abdomen is nondistended, soft and nontender. Normal bowel sounds heard. Central nervous system: Alert (however keeps eyes closed) and oriented only to self and partly to place. No focal neurological deficits. Extremities: Symmetric 5 x 5 power.   Data Reviewed: Basic Metabolic Panel:  Recent Labs Lab 03/31/15 1220 04/02/15 0510  NA 133*  134*  K 3.8 3.6  CL 95* 98*  CO2 29 26  GLUCOSE 94 87  BUN 16 12  CREATININE 0.94 1.01*  CALCIUM 9.2 8.5*   Liver Function Tests:  Recent Labs Lab 03/31/15 1220  AST 31  ALT 18  ALKPHOS 35*  BILITOT 0.9  PROT 7.2  ALBUMIN 3.5   No results  for input(s): LIPASE, AMYLASE in the last 168 hours.  Recent Labs Lab 04/01/15 1540  AMMONIA 20   CBC:  Recent Labs Lab 03/31/15 1220 04/02/15 0510  WBC 5.7 4.8  NEUTROABS 3.4  --   HGB 12.1 10.3*  HCT 36.8 32.1*  MCV 86.0 85.4  PLT 247 192   Cardiac Enzymes: No results for input(s): CKTOTAL, CKMB, CKMBINDEX, TROPONINI in the last 168 hours. BNP (last 3 results) No results for input(s): PROBNP in the last 8760 hours. CBG: No results for input(s): GLUCAP in the last 168 hours.  Recent Results (from the past 240 hour(s))  Culture, blood (routine x 2)     Status: None (Preliminary result)   Collection Time: 03/31/15 12:10 PM  Result Value Ref Range Status   Specimen Description BLOOD RIGHT ANTECUBITAL  Final   Special Requests BOTTLES DRAWN AEROBIC AND ANAEROBIC 5CC  Final   Culture   Final    NO GROWTH < 24 HOURS Performed at [facility]    Report Status PENDING  Incomplete  Culture, blood (routine x 2)     Status: None (Preliminary result)   Collection Time: 03/31/15 12:13 PM  Result Value Ref Range Status   Specimen Description BLOOD RIGHT FOREARM  Final   Special Requests BOTTLES DRAWN AEROBIC AND ANAEROBIC  Final   Culture   Final    NO GROWTH < 24 HOURS Performed at [facility]    Report Status PENDING  Incomplete  Urine culture     Status: None   Collection Time: 03/31/15 12:38 PM  Result Value Ref Range Status   Specimen Description URINE, CLEAN CATCH  Final   Special Requests NONE  Final   Culture   Final    MULTIPLE SPECIES PRESENT, SUGGEST RECOLLECTION Performed at [facility]    Report Status 04/02/2015 FINAL  Final  Culture, sputum-assessment     Status: None   Collection Time: 03/31/15  4:13 PM  Result Value Ref Range Status   Specimen Description Expect. Sput  Final   Special Requests Normal  Final   Sputum evaluation   Final    THIS SPECIMEN IS ACCEPTABLE. RESPIRATORY CULTURE REPORT TO FOLLOW.   Report Status  03/31/2015 FINAL  Final           Studies: Dg Chest 2 View  04/02/2015   CLINICAL DATA:  Pneumonia.  EXAM: CHEST  2 VIEW  COMPARISON:  03/31/2015  FINDINGS: Consolidation noted in the left lower lobe compatible with pneumonia. All no confluent opacity on the right. Heart is normal size. No effusions or acute bony abnormality.  IMPRESSION: Left lower lobe consolidation compatible with pneumonia. This is worsened since prior study.   Electronically Signed   By: Charlett Nose M.D.   On: 04/02/2015 09:59   Dg Chest 2 View  03/31/2015   CLINICAL DATA:  Sepsis  EXAM: CHEST  2 VIEW  COMPARISON:  05/14/2014  FINDINGS: Heart size is mildly enlarged.  Negative for heart failure.  Mild right lower lobe atelectasis. No definite pneumonia. Negative for effusion or mass.  IMPRESSION: Mild right lower lobe atelectasis.  Electronically Signed   By: Marlan Palau M.D.   On: 03/31/2015 13:52        Scheduled Meds: . allopurinol  100 mg Oral Daily  . budesonide (PULMICORT) nebulizer solution  0.25 mg Nebulization BID  . cholecalciferol  2,000 Units Oral Daily  . divalproex  250 mg Oral Daily  . divalproex  500 mg Oral QHS  . donepezil  10 mg Oral QHS  . DULoxetine  40 mg Oral Daily  . enoxaparin (LOVENOX) injection  40 mg Subcutaneous Q24H  . ezetimibe  10 mg Oral Daily  . famotidine  20 mg Oral Daily  . fenofibrate  160 mg Oral Daily  . guaiFENesin  1,200 mg Oral BID  . ipratropium-albuterol  3 mL Nebulization TID  . losartan  100 mg Oral Daily  . metoprolol succinate  100 mg Oral Daily  . multivitamin-prenatal  1 tablet Oral Q1200  . piperacillin-tazobactam (ZOSYN)  IV  3.375 g Intravenous Q8H  . QUEtiapine  12.5 mg Oral QHS  . vancomycin  1,250 mg Intravenous Q12H   Continuous Infusions:    Principal Problem:   HCAP (healthcare-associated pneumonia) Active Problems:   Bipolar disorder   Essential hypertension   Moderate dementia    Time spent: 30 minutes.    Marcellus Scott,  MD, FACP, FHM. [facility] Pager [phone]  If 7PM-7AM, please contact night-coverage [email or web address] Password [email or web address] 04/02/2015, 10:10 AM    LOS: 2 days

## 2015-04-02 NOTE — Progress Notes (Signed)
Initial Nutrition Assessment  DOCUMENTATION CODES:   Obesity unspecified  INTERVENTION:   Provide Ensure Enlive po BID, each supplement provides 350 kcal and 20 grams of protein RD to continue to monitor  NUTRITION DIAGNOSIS:   Inadequate oral intake related to lethargy/confusion as evidenced by meal completion < 50%.  GOAL:   Patient will meet greater than or equal to 90% of their needs  MONITOR:   PO intake, Supplement acceptance, Labs, Weight trends, Skin, I & O's  REASON FOR ASSESSMENT:   Malnutrition Screening Tool    ASSESSMENT:   69 y.o. 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 The patient is a bit confused, and cannot provide a lot of details about her HPI, although she does endorse the recent onset of a productive cough, and is observed to be coughing up yellow/green sputum.  Pt asleep in room during RD visit. Per H&P, pt cannot provide a thorough history. No family in room at this time.  Per weight history documentation, pt's weight has remained stable.  SLP evaluated 7/30, recommend dysphagia 2 diet with thin liquids. PO intake: 10-45%. Pt will also need full supervision and assistance with feeding. Pt would benefit from nutritional supplementation, RD to order.  Labs reviewed: Low Na Elevated Creatinine  Diet Order:  DIET DYS 2 Room service appropriate?: Yes; Fluid consistency:: Thin  Skin:  Reviewed, no issues  Last BM:  PTA  Height:   Ht Readings from Last 1 Encounters:  03/31/15  (1.651 m)    Weight:   Wt Readings from Last 1 Encounters:  03/31/15 220 lb (99.791 kg)    Ideal Body Weight:  56.8 kg  BMI:  Body mass index is 36.61 kg/(m^2).  Estimated Nutritional Needs:   Kcal:  1500-1700  Protein:  65-75g  Fluid:  1.6L/day  EDUCATION NEEDS:   No education needs  identified at this time  Tilda Franco, MS, RD, LDN Pager: [phone] After Hours Pager: [phone]

## 2015-04-03 ENCOUNTER — Inpatient Hospital Stay (HOSPITAL_COMMUNITY): Payer: Medicare Other

## 2015-04-03 DIAGNOSIS — J9601 Acute respiratory failure with hypoxia: Secondary | ICD-10-CM

## 2015-04-03 DIAGNOSIS — J449 Chronic obstructive pulmonary disease, unspecified: Secondary | ICD-10-CM

## 2015-04-03 LAB — CBC
HEMATOCRIT: 33.4 % — AB (ref 36.0–46.0)
HEMOGLOBIN: 10.6 g/dL — AB (ref 12.0–15.0)
MCH: 27.2 pg (ref 26.0–34.0)
MCHC: 31.7 g/dL (ref 30.0–36.0)
MCV: 85.6 fL (ref 78.0–100.0)
PLATELETS: 224 10*3/uL (ref 150–400)
RBC: 3.9 MIL/uL (ref 3.87–5.11)
RDW: 15.7 % — ABNORMAL HIGH (ref 11.5–15.5)
WBC: 4.6 10*3/uL (ref 4.0–10.5)

## 2015-04-03 LAB — LEGIONELLA ANTIGEN, URINE

## 2015-04-03 LAB — MRSA PCR SCREENING: MRSA by PCR: NEGATIVE

## 2015-04-03 MED ORDER — POTASSIUM CHLORIDE 20 MEQ/15ML (10%) PO SOLN
40.0000 meq | Freq: Once | ORAL | Status: AC
  Administered 2015-04-03 (×2): 40 meq via ORAL
  Filled 2015-04-03: qty 30

## 2015-04-03 MED ORDER — LEVOFLOXACIN IN D5W 750 MG/150ML IV SOLN
750.0000 mg | Freq: Every day | INTRAVENOUS | Status: DC
  Administered 2015-04-03 – 2015-04-06 (×4): 750 mg via INTRAVENOUS
  Filled 2015-04-03 (×5): qty 150

## 2015-04-03 MED ORDER — ALBUTEROL SULFATE (2.5 MG/3ML) 0.083% IN NEBU
2.5000 mg | INHALATION_SOLUTION | RESPIRATORY_TRACT | Status: DC | PRN
  Filled 2015-04-03: qty 3

## 2015-04-03 MED ORDER — FUROSEMIDE 10 MG/ML IJ SOLN
40.0000 mg | Freq: Once | INTRAMUSCULAR | Status: AC
  Administered 2015-04-03: 40 mg via INTRAVENOUS
  Filled 2015-04-03: qty 4

## 2015-04-03 MED ORDER — IPRATROPIUM-ALBUTEROL 0.5-2.5 (3) MG/3ML IN SOLN
3.0000 mL | RESPIRATORY_TRACT | Status: DC
  Administered 2015-04-03 (×3): 3 mL via RESPIRATORY_TRACT
  Filled 2015-04-03: qty 3
  Filled 2015-04-03: qty 39
  Filled 2015-04-03: qty 3

## 2015-04-03 MED ORDER — ALBUTEROL SULFATE (2.5 MG/3ML) 0.083% IN NEBU: 2.5000 mg | INHALATION_SOLUTION | RESPIRATORY_TRACT | Status: DC | PRN

## 2015-04-03 MED ORDER — DOXYCYCLINE HYCLATE 100 MG PO TABS
100.0000 mg | ORAL_TABLET | Freq: Two times a day (BID) | ORAL | Status: DC
  Filled 2015-04-03 (×2): qty 1

## 2015-04-03 MED ORDER — IPRATROPIUM-ALBUTEROL 0.5-2.5 (3) MG/3ML IN SOLN
3.0000 mL | Freq: Three times a day (TID) | RESPIRATORY_TRACT | Status: DC
  Administered 2015-04-04 – 2015-04-07 (×10): 3 mL via RESPIRATORY_TRACT
  Filled 2015-04-03 (×11): qty 3

## 2015-04-03 NOTE — Consult Note (Signed)
PULMONARY / CRITICAL CARE MEDICINE   Name: Deborah [name] MRN: [id number] DOB: 09-26-1945    ADMISSION DATE:  03/31/2015 CONSULTATION DATE:  Wylie Hail  REFERRING MD :  Hongalgi   CHIEF COMPLAINT:  Worsening respiratory failure   INITIAL PRESENTATION:  69 year old female SNF resident w/c bound at baseline. Admitted 7/29 w/ working dx of HCAP and hypoxic resp failure (room air sats 89% on presentation) w/ sepsis associated encephalopathy.  PCCM asked to see on 8/1 for increased resp distress and O2 requirements.    STUDIES:  CT head 8/1:   SIGNIFICANT EVENTS: 7/29: admitted w/ working dx of HCAP. 7/30: U strep neg;  Fever spikes to 103, more confused/lethargic SLP eval recommended dysphagia 2 by bedside swallow; felt mild aspiration risk 8/1  HISTORY OF PRESENT ILLNESS:   69 y.o. 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  Admitted to [facility] on 03/31/15 with complaints of feeling bad for 2 weeks, recent onset of cough productive of yellow/green sputum, fatigue, confusion and weakness of several days' duration. Patient was hospitalized for possible HCAP; treated w/ appropriate empiric abx. PCCM asked to see on 8/1 for progressive respiratory distress.   PAST MEDICAL HISTORY :   has a past medical history of Bipolar disorder; Depression; Hyperlipidemia; Hypertension; Hiatal hernia; Esophageal reflux; Other specified disorders of liver; Headache(784.0); Hepatomegaly; Other chronic nonalcoholic liver disease; Cardiomegaly; Irritable bowel syndrome; Diverticulosis of colon (without mention of hemorrhage); COPD (chronic obstructive pulmonary disease); Nausea; Family history of colon cancer; Gout flare; Dementia; Chronic headache; Peripheral neuropathy; PLMD (periodic limb movement disorder); and Abscess of face (05/06/2014).  has past surgical  history that includes Cholecystectomy; Abdominal hysterectomy; Ovarian cyst removal; removal of cyst on chest wall; and Appendectomy. Prior to Admission medications   Medication Sig Start Date End Date Taking? Authorizing Provider  acetaminophen (TYLENOL) 500 MG tablet Take 500 mg by mouth every 8 (eight) hours as needed (pain).   Yes Historical Provider, MD  albuterol (VENTOLIN HFA) 108 (90 BASE) MCG/ACT inhaler Inhale 1 puff into the lungs every 6 (six) hours as needed for wheezing or shortness of breath (nocturnal coughing).   Yes Historical Provider, MD  allopurinol (ZYLOPRIM) 100 MG tablet Take 100 mg by mouth daily.   Yes Historical Provider, MD  azelastine (ASTELIN) 137 MCG/SPRAY nasal spray Place 1 spray into both nostrils 2 (two) times daily as needed for rhinitis. Use in each nostril as directed 12/20/11  Yes Maurice March, MD  Cholecalciferol (VITAMIN D) 2000 UNITS tablet Take 2,000 Units by mouth daily.   Yes Historical Provider, MD  cloNIDine (CATAPRES) 0.1 MG tablet Take 0.1 mg by mouth daily as needed (SBP >180).    Yes Historical Provider, MD  divalproex (DEPAKOTE ER) 250 MG 24 hr tablet Take 250-500 mg by mouth 2 (two) times daily. Pt takes one in the morning and two at night.   Yes Historical Provider, MD  donepezil (ARICEPT) 10 MG tablet Take 10 mg by mouth at bedtime.   Yes Historical Provider, MD  DULoxetine (CYMBALTA) 60 MG capsule Take 60 mg by mouth daily.   Yes Historical Provider, MD  ezetimibe (ZETIA) 10 MG tablet Take 10 mg by mouth daily.   Yes Historical Provider, MD  famotidine (PEPCID) 20 MG tablet Take 20 mg by mouth daily.   Yes Historical Provider, MD  fenofibrate 160 MG tablet Take 160  mg by mouth daily.   Yes Historical Provider, MD  fluticasone (FLOVENT HFA) 44 MCG/ACT inhaler Inhale 1 puff into the lungs 2 (two) times daily. Rinse mouth after use   Yes Historical Provider, MD  ipratropium-albuterol (DUONEB) 0.5-2.5 (3) MG/3ML SOLN Take 3 mLs by nebulization  every 6 (six) hours. For shortness of breath 07/05/11  Yes Agapito Games, MD  LORazepam (ATIVAN) 0.5 MG tablet Take 0.25 mg by mouth at bedtime. May take 1 tablet every 8 hours as needed for anxiety.   Yes Historical Provider, MD  losartan (COZAAR) 100 MG tablet Take 100 mg by mouth daily.   Yes Historical Provider, MD  metoprolol succinate (TOPROL-XL) 100 MG 24 hr tablet Take 100 mg by mouth daily. Take with or immediately following a meal.   Yes Historical Provider, MD  polyethylene glycol (MIRALAX / GLYCOLAX) packet Take 17 g by mouth daily as needed for mild constipation or moderate constipation.    Yes Historical Provider, MD  Prenatal Vit-Fe Fumarate-FA (MULTIVITAMIN-PRENATAL) 27-0.8 MG TABS tablet Take 1 tablet by mouth daily at 12 noon. For anemia   Yes Historical Provider, MD  QUEtiapine (SEROQUEL) 25 MG tablet Take 25 mg by mouth at bedtime.   Yes Historical Provider, MD   Allergies  Allergen Reactions  . Bee Venom Anaphylaxis  . Dicyclomine Hcl Anaphylaxis  . Omeprazole Other (See Comments)     GI upset.  . Other Other (See Comments)    Reaction to reductase inhibitors per MAR  . Statins Other (See Comments)    myalgias    FAMILY HISTORY:  indicated that her mother is deceased. She indicated that her father is deceased. She indicated that both of her brothers are alive. She indicated that all of her four daughters are alive. She indicated that her son is alive.  SOCIAL HISTORY:  reports that she has never smoked. She has never used smokeless tobacco. She reports that she does not drink alcohol or use illicit drugs.  REVIEW OF SYSTEMS:  Unable due to delirium  VITAL SIGNS: Temp:  [97.8 F (36.6 C)-100.5 F (38.1 C)] 99.3 F (37.4 C) (08/01 0956) Pulse Rate:  [73-98] 93 (08/01 0956) Resp:  [18-24] 24 (08/01 0956) BP: (101-131)/(55-91) 110/55 mmHg (08/01 0956) SpO2:  [92 %-98 %] 94 % (08/01 0956) FiO2 (%):  [28 %] 28 % (08/01 0859) HEMODYNAMICS:   VENTILATOR  SETTINGS: Vent Mode:  [-]  FiO2 (%):  [28 %] 28 % INTAKE / OUTPUT:  Intake/Output Summary (Last 24 hours) at 04/03/15 1114 Last data filed at 04/02/15 1821  Gross per 24 hour  Intake    320 ml  Output      0 ml  Net    320 ml    PHYSICAL EXAMINATION: General:  69 year old female, no acute distress. Appears stated age. Slightly agitated  Neuro:  Awake, moves all ext. No focal def  HEENT:  Poor dentition no JVD  Cardiovascular:  rrr Lungs:  Scattered rhonchi R>L. Mild retractions. + accessory muscle use  Abdomen:  Soft, non-tender + bowel sounds  Musculoskeletal:  Intact  Skin:  Intact   LABS:  CBC  Recent Labs Lab 03/31/15 1220 04/02/15 0510 04/03/15 0525  WBC 5.7 4.8 4.6  HGB 12.1 10.3* 10.6*  HCT 36.8 32.1* 33.4*  PLT 247 192 224   Coag's No results for input(s): APTT, INR in the last 168 hours. BMET  Recent Labs Lab 03/31/15 1220 04/02/15 0510  NA 133* 134*  K 3.8  3.6  CL 95* 98*  CO2 29 26  BUN 16 12  CREATININE 0.94 1.01*  GLUCOSE 94 87   Electrolytes  Recent Labs Lab 03/31/15 1220 04/02/15 0510  CALCIUM 9.2 8.5*   Sepsis Markers  Recent Labs Lab 03/31/15 1211 03/31/15 1526  LATICACIDVEN 1.03 1.07   ABG No results for input(s): PHART, PCO2ART, PO2ART in the last 168 hours. Liver Enzymes  Recent Labs Lab 03/31/15 1220  AST 31  ALT 18  ALKPHOS 35*  BILITOT 0.9  ALBUMIN 3.5   Cardiac Enzymes No results for input(s): TROPONINI, PROBNP in the last 168 hours. Glucose No results for input(s): GLUCAP in the last 168 hours.  Imaging Dg Chest Port 1 View  04/03/2015   CLINICAL DATA:  69 year old female with pneumonia, sepsis. Initial encounter.  EXAM: PORTABLE CHEST - 1 VIEW  COMPARISON:  04/02/2015 and earlier.  FINDINGS: Portable AP semi upright view at 0948 hrs. Stable lung volumes. Stable mediastinal contours. Azygos fissure re- identified. Continued streaky opacity at the left lung base, and increased streaky opacity at the right  lung base since yesterday. No pneumothorax, pleural effusion or pulmonary edema identified.  IMPRESSION: Increased bibasilar streaky opacity since yesterday could relate collect atelectasis or infection. No pleural effusion identified.   Electronically Signed   By: Odessa Fleming M.D.   On: 04/03/2015 11:10     ASSESSMENT / PLAN:  Acute hypoxic respiratory failure in setting HCAP. Intially room sats 89%; has required escalating O2 support. Can't exclude element of aspiration given risk. Aeration a little worse on CXR. Likely more ATX . Clinically not in acute distress but does have some degree of accessory muscle use. Plan Cont current abx Mobilize/get OOB IS/flutter Add nebs Lasix X 1  Acute encephalopathy; superimposed on underlying dementia w/ h/o bipolar disease and prior SDH Plan Cont current supportive rx  Anemia of chronic disease  Plan Trend CBC Cont LMWH  HTN Plan Cont clonidine    Reviewed above, examined.  She is SNF resident.  She had 2 weeks of not feeling well, cough with yellow/green sputum.  She had mild confusion, but has hx of dementia.  She was admitted to hospital and started on Abx of for HCAP.  She was noted to have more respiratory distress this AM and needing more oxygen.  She was sent to SDU and PCCM consulted.  She c/o chest congestion, but has trouble expectorating.  She feels sleepy and fatigued.  She denies chest or abdominal pain.  She is in SDU, sleepy but wakes up easily.  Has trouble with short term memory recall, but follows commands.  Has scattered crackles, no wheeze.  Heart sounds regular, abdomen soft.  CXR showed b/l lower infiltrates.  CT head unremarkable.  Lab work shows mild anemia.  Continue current abx.  Continue supplemental oxygen >> does not appear she needs assisted ventilation at this time.  Will continue to monitor for this in SDU.  Continue her BD regimen and bronchial hygiene.  F/u CXR intermittently.  Coralyn Helling, MD [facility]  Pulmonary/Critical Care 04/03/2015, 1:15 PM Pager:  [phone] After 3pm call: [phone]

## 2015-04-03 NOTE — Clinical Social Work Note (Addendum)
Clinical Social Work Assessment  Patient Details  Name: Deborah [name] MRN: [id number] Date of Birth: 12-Sep-1945  Date of referral:  04/03/15               Reason for consult:  Discharge Planning                Permission sought to share information with:  Family Supports Permission granted to share information::  Yes, Verbal Permission Granted  Name::     Darla Lesches::     Relationship::  son  Contact Information:     Housing/Transportation Living arrangements for the past 2 months:  [place] of Information:  Adult Children, Facility Patient Interpreter Needed:  None Criminal Activity/Legal Involvement Pertinent to Current Situation/Hospitalization:  No - Comment as needed Significant Relationships:  Adult Children Lives with:  Facility Resident Do you feel safe going back to the place where you live?  Yes Need for family participation in patient care:  Yes (Comment)  Care giving concerns:  Patient currently resident at memory care ALF but might need higher level of care at DC depending on progress.   Social Worker assessment / plan:  CSW received referral due to patient being admitted from [place]. CSW reviewed chart and met with patient at bedside. Patient drowsy and confused and unable to fully participate in assessment. Per chart review, no HCPOA listed and son was first contact on ALF paperwork. CSW called and spoke with son Shanon Brow) via phone. CSW introduced myself and explained role.  Son reports he lives in [place] but tries to provide as much assistance for patient as needed. Son reports patient has been at ALF for about 1.5 years and has been doing well there. Son reports family will try and visit patient while in the hospital but asked to be kept updated on plans. CSW provided son with CSW contact information if further needs arise.  CSW spoke with Melissa at ALF who reports patient lives in memory care unit. Patient has had previous strokes in the  past and has been back at ALF for about 6 months. Since last stroke, patient has been unable to walk. Patient self-propels in wheelchair and is able to bear weight to transfer at times. ALF reports they called 911 because patient was not responding and had a fever. Patient is usually very engaged and interacts with activities planned through community. ALF is agreeable for patient to return if she is back at baseline at DC.  CSW completed FL2 and will continue to follow.  Employment status:  Retired Forensic scientist:  Medicare PT Recommendations:  Not assessed at this time [place] / Referral to community resources:  Other (Comment Required), [place] (SNF vs returning to ALF)  Patient/Family's Response to care:  Patient unable to fully participate in assessment. Son engaged and thanked CSW for call.  Patient/Family's Understanding of and Emotional Response to Diagnosis, Current Treatment, and Prognosis:  Patient is LT resident at ALF but due to deconditioning and being transferred to ICU, patient might require higher level of care. Family reports they want whatever is needed for patient to ensure she is improving.   Emotional Assessment Appearance:  Appears older than stated age Attitude/Demeanor/Rapport:  Sedated Affect (typically observed):  Quiet Orientation:  Oriented to Self Alcohol / Substance use:  Not Applicable Psych involvement (Current and /or in the community):  No (Comment)  Discharge Needs  Concerns to be addressed:  No discharge needs identified Readmission within the last  30 days:  No Current discharge risk:  None Barriers to Discharge:  No Barriers Identified   Boone Master, LCSW 04/03/2015, 10:41 AM [phone]

## 2015-04-03 NOTE — Progress Notes (Signed)
PROGRESS NOTE    Deborah [name]:[id number] DOB: 1945/09/06 DOA: 03/31/2015 PCP: [name], MD  HPI/Brief narrative 69 y.o. 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 Patient was hospitalized for possible HCAP. On 8/1, patient noted to have respiratory distress and she was transferred to stepdown unit for close monitoring and management. CCM was consulted.   Assessment/Plan:  Principal Problem:  HCAP (healthcare-associated pneumonia) LLL Vs Aspiration PNA - Patient is a SNF resident, so placed on empiric vancomycin/Zosyn. - Blood cultures 2: Negative to date. Sputum culture: Pending - Urine pneumococcal antigen: Negative - Aggressive pulmonary toilet. - Narrow antibiotics as culture data returns in the face of clinical stability. - Speech therapy evaluation appreciated: Diet as below. - On 8/1, nurses reported patient's worsening dyspnea. Chest x-ray shows increasing bibasilar streaky opacity consistent with atelectasis or infiltrates. Due to concern for decline, patient was transferred to stepdown unit and CCM was consulted. Levofloxacin IV was added for atypical coverage.   Active Problems:  Sepsis secondary to healthcare associated versus aspiration pneumonia - Management as above.    Bipolar disorder - Continue Depakote, Seroquel and Cymbalta. - Patient was slightly somnolent over the weekend. Ammonia level normal. Marginally reduced Seroquel and Cymbalta doses. Monitor closely. - Mental status better.   Essential hypertension - Continue clonidine, metoprolol and Cozaar. - Controlled   Moderate dementia - Continue  Aricept.  Acute encephalopathy - Most likely secondary to acute illness complicating underlying dementia and bipolar disorder. No focal deficits seen. - Ammonia levels: 20 - Reduce Seroquel and Cymbalta doses marginally. - Mental status better - CT head without acute findings. No acute infarct or hemorrhage. Findings of an careful malacia in right frontal parietal region-area of prior hemorrhage.  Dysphagia - Speech therapy input appreciated. Started on dysphagia 2 diet and thin liquids.  Anemia - ? Secondary to hemodilution versus critical illness. No reported bleeding. Follow CBC in a.m.    DVT prophylaxis: - Lovenox Code Status: Full. Family Communication: None at bedside today. Left message for daughter Ms. [name] on 04/02/15. Disposition Plan: Transferred to stepdown unit on 7/31. DC SNF when medically stable   Consultants:  None  Procedures:  None  Antibiotics:  IV vancomycin 7/29 >  IV Zosyn 7/29 >  IV levofloxacin 8/1 >  Subjective: Patient oriented to self and partly to place. Intermittent dyspnea. As per nursing, respiratory distress.  Objective: Filed Vitals:   04/03/15 1131 04/03/15 1159 04/03/15 1200 04/03/15 1400  BP: 145/69 110/59 110/59 91/57  Pulse: 80 77 76 86  Temp: 99 F (37.2 C)     TempSrc: Oral     Resp: 33  Height:  (1.626 m)     Weight: 88 kg (194 lb 0.1 oz)     SpO2: 95% 96% 96% 95%    Intake/Output Summary (Last 24 hours) at 04/03/15 1524 Last data filed at 04/03/15 1311  Gross per 24 hour  Intake    350 ml  Output      0 ml  Net    350 ml   Filed Weights   03/31/15 1208 04/03/15 1131  Weight: 99.791 kg (220 lb) 88 kg (194 lb 0.1 oz)  Exam:  General exam: Moderately built and obese pleasant elderly female lying propped up in bed with mild respiratory distress. Looks worse than she did yesterday. Respiratory system: Harsh and reduced breath sounds bilaterally with scattered few expiratory rhonchi  and basal crackles. Mild increased work of breathing. Cardiovascular system: S1 & S2 heard, RRR. No JVD, murmurs, gallops, clicks or pedal edema. Non-telemetry ( EKG without acute findings. QTC 422 ms). Gastrointestinal system: Abdomen is nondistended, soft and nontender. Normal bowel sounds heard. Central nervous system: Alert and oriented only to self and partly to place. No focal neurological deficits. Extremities: Symmetric 5 x 5 power.   Data Reviewed: Basic Metabolic Panel:  Recent Labs Lab 03/31/15 1220 04/02/15 0510  NA 133* 134*  K 3.8 3.6  CL 95* 98*  CO2 29 26  GLUCOSE 94 87  BUN 16 12  CREATININE 0.94 1.01*  CALCIUM 9.2 8.5*   Liver Function Tests:  Recent Labs Lab 03/31/15 1220  AST 31  ALT 18  ALKPHOS 35*  BILITOT 0.9  PROT 7.2  ALBUMIN 3.5   No results for input(s): LIPASE, AMYLASE in the last 168 hours.  Recent Labs Lab 04/01/15 1540  AMMONIA 20   CBC:  Recent Labs Lab 03/31/15 1220 04/02/15 0510 04/03/15 0525  WBC 5.7 4.8 4.6  NEUTROABS 3.4  --   --   HGB 12.1 10.3* 10.6*  HCT 36.8 32.1* 33.4*  MCV 86.0 85.4 85.6  PLT 247 192 224   Cardiac Enzymes: No results for input(s): CKTOTAL, CKMB, CKMBINDEX, TROPONINI in the last 168 hours. BNP (last 3 results) No results for input(s): PROBNP in the last 8760 hours. CBG: No results for input(s): GLUCAP in the last 168 hours.  Recent Results (from the past 240 hour(s))  Culture, blood (routine x 2)     Status: None (Preliminary result)   Collection Time: 03/31/15 12:10 PM  Result Value Ref Range Status   Specimen Description BLOOD RIGHT ANTECUBITAL  Final   Special Requests BOTTLES DRAWN AEROBIC AND ANAEROBIC 5CC  Final   Culture   Final    NO GROWTH 2 DAYS Performed at [facility]    Report Status PENDING  Incomplete  Culture, blood (routine x 2)     Status: None (Preliminary result)   Collection Time: 03/31/15 12:13 PM  Result Value Ref Range Status   Specimen Description  BLOOD RIGHT FOREARM  Final   Special Requests BOTTLES DRAWN AEROBIC AND ANAEROBIC  Final   Culture   Final    NO GROWTH 2 DAYS Performed at [facility]    Report Status PENDING  Incomplete  Urine culture     Status: None   Collection Time: 03/31/15 12:38 PM  Result Value Ref Range Status   Specimen Description URINE, CLEAN CATCH  Final   Special Requests NONE  Final   Culture   Final    MULTIPLE SPECIES PRESENT, SUGGEST RECOLLECTION Performed at [facility]    Report Status 04/02/2015 FINAL  Final  Culture, sputum-assessment     Status: None   Collection Time: 03/31/15  4:13 PM  Result Value Ref Range Status   Specimen Description Expect. Sput  Final   Special Requests Normal  Final   Sputum evaluation   Final    THIS SPECIMEN IS ACCEPTABLE. RESPIRATORY CULTURE REPORT TO FOLLOW.   Report Status 03/31/2015 FINAL  Final  MRSA PCR Screening     Status: None   Collection Time: 04/03/15 11:20 AM  Result Value Ref Range Status   MRSA by PCR NEGATIVE NEGATIVE Final    Comment:        The GeneXpert MRSA Assay (FDA approved for NASAL specimens only), is one component of a comprehensive MRSA colonization surveillance program. It is not intended to diagnose MRSA infection nor to guide or monitor treatment for MRSA infections.            Studies: Dg Chest 2 View  04/02/2015   CLINICAL DATA:  Pneumonia.  EXAM: CHEST  2 VIEW  COMPARISON:  03/31/2015  FINDINGS: Consolidation noted in the left lower lobe compatible with pneumonia. All no confluent opacity on the right. Heart is normal size. No effusions or acute bony abnormality.  IMPRESSION: Left lower lobe consolidation compatible with pneumonia. This is worsened since prior study.   Electronically Signed   By: Charlett Nose M.D.   On: 04/02/2015 09:59   Ct Head Wo Contrast  04/03/2015   CLINICAL DATA:  Altered mental status  EXAM: CT HEAD WITHOUT CONTRAST  TECHNIQUE: Contiguous axial images were obtained from  the base of the skull through the vertex without intravenous contrast.  COMPARISON:  May 24, 2014  FINDINGS: Mild diffuse atrophy is stable. There is encephalomalacia in the right frontal -parietal junction immediately adjacent to the falx at the site of a prior hemorrhage. There is currently no hemorrhage on this study. There is no mass effect, extra-axial fluid collection, or midline shift. There is mild small vessel disease in the centra semiovale bilaterally. There is evidence of a prior small infarct in the anterior right parietal lobe at the gray-white junction, stable. No acute infarct is evident. Maxillary antrum with mild expansion medially. There is mucosal thickening in the left maxillary antrum.  IMPRESSION: Evidence of encephalomalacia at the site of a prior hemorrhage just to the right of the falx at the right frontal -parietal junction. Small infarct anterior right parietal lobe. Atrophy with mild small vessel disease. No acute infarct. No acute hemorrhage. Probable mucocele in the right maxillary antrum given medial expansion of the maxillary sinus wall.   Electronically Signed   By: Bretta Bang III M.D.   On: 04/03/2015 11:20   Dg Chest Port 1 View  04/03/2015   CLINICAL DATA:  69 year old female with pneumonia, sepsis. Initial encounter.  EXAM: PORTABLE CHEST - 1 VIEW  COMPARISON:  04/02/2015 and earlier.  FINDINGS: Portable AP semi upright view at 0948 hrs. Stable lung volumes. Stable mediastinal contours. Azygos fissure re- identified. Continued streaky opacity at the left lung base, and increased streaky opacity at the right lung base since yesterday. No pneumothorax, pleural effusion or pulmonary edema identified.  IMPRESSION: Increased bibasilar streaky opacity since yesterday could relate collect atelectasis or infection. No pleural effusion identified.   Electronically Signed   By: Odessa Fleming M.D.   On: 04/03/2015 11:10        Scheduled Meds: . allopurinol  100 mg Oral  Daily  . budesonide (PULMICORT) nebulizer solution  0.25 mg Nebulization BID  . cholecalciferol  2,000 Units Oral Daily  . divalproex  250 mg Oral Daily  . divalproex  500 mg Oral QHS  . donepezil  10 mg Oral QHS  . DULoxetine  40 mg Oral Daily  . enoxaparin (LOVENOX) injection  40 mg Subcutaneous Q24H  . ezetimibe  10 mg Oral Daily  . famotidine  20 mg Oral Daily  . feeding supplement (ENSURE ENLIVE)  237 mL Oral BID BM  . fenofibrate  160 mg Oral Daily  . guaiFENesin  1,200 mg Oral BID  . ipratropium-albuterol  3 mL Nebulization Q4H  . levofloxacin (LEVAQUIN) IV  750 mg Intravenous Daily  . losartan  100 mg Oral Daily  . metoprolol succinate  100 mg Oral Daily  . multivitamin-prenatal  1 tablet Oral Q1200  . piperacillin-tazobactam (ZOSYN)  IV  3.375 g Intravenous Q8H  . QUEtiapine  12.5 mg Oral QHS  . vancomycin  1,000 mg Intravenous Q12H   Continuous Infusions:    Principal Problem:   HCAP (healthcare-associated pneumonia) Active Problems:   Bipolar disorder   Essential hypertension   Moderate dementia    Time spent: 45 minutes.    Marcellus Scott, MD, FACP, FHM. [facility] Pager [phone]  If 7PM-7AM, please contact night-coverage [email or web address] Password [email or web address] 04/03/2015, 3:24 PM    LOS: 3 days

## 2015-04-03 NOTE — Progress Notes (Signed)
David's wife called and updated.

## 2015-04-04 DIAGNOSIS — E876 Hypokalemia: Secondary | ICD-10-CM | POA: Insufficient documentation

## 2015-04-04 DIAGNOSIS — G9340 Encephalopathy, unspecified: Secondary | ICD-10-CM | POA: Insufficient documentation

## 2015-04-04 LAB — CULTURE, RESPIRATORY

## 2015-04-04 LAB — BASIC METABOLIC PANEL
Anion gap: 9 (ref 5–15)
BUN: 11 mg/dL (ref 6–20)
CO2: 34 mmol/L — AB (ref 22–32)
Calcium: 8.7 mg/dL — ABNORMAL LOW (ref 8.9–10.3)
Chloride: 92 mmol/L — ABNORMAL LOW (ref 101–111)
Creatinine, Ser: 0.97 mg/dL (ref 0.44–1.00)
GFR calc Af Amer: >60 mL/min (ref >60)
GFR calc non Af Amer: 58 mL/min — ABNORMAL LOW (ref >60)
Glucose, Bld: 100 mg/dL — ABNORMAL HIGH (ref 65–99)
POTASSIUM: 3 mmol/L — AB (ref 3.5–5.1)
Sodium: 135 mmol/L (ref 135–145)

## 2015-04-04 LAB — CBC
HCT: 32.6 % — ABNORMAL LOW (ref 36.0–46.0)
HEMOGLOBIN: 10.6 g/dL — AB (ref 12.0–15.0)
MCH: 28 pg (ref 26.0–34.0)
MCHC: 32.5 g/dL (ref 30.0–36.0)
MCV: 86.2 fL (ref 78.0–100.0)
PLATELETS: 253 10*3/uL (ref 150–400)
RBC: 3.78 MIL/uL — AB (ref 3.87–5.11)
RDW: 15.8 % — ABNORMAL HIGH (ref 11.5–15.5)
WBC: 4.1 10*3/uL (ref 4.0–10.5)

## 2015-04-04 LAB — CULTURE, RESPIRATORY W GRAM STAIN

## 2015-04-04 MED ORDER — POTASSIUM CHLORIDE CRYS ER 20 MEQ PO TBCR
40.0000 meq | EXTENDED_RELEASE_TABLET | Freq: Once | ORAL | Status: AC
  Administered 2015-04-04: 40 meq via ORAL
  Filled 2015-04-04: qty 2

## 2015-04-04 MED ORDER — FUROSEMIDE 10 MG/ML IJ SOLN
40.0000 mg | Freq: Once | INTRAMUSCULAR | Status: AC
  Administered 2015-04-04: 40 mg via INTRAVENOUS
  Filled 2015-04-04: qty 4

## 2015-04-04 MED ORDER — CETYLPYRIDINIUM CHLORIDE 0.05 % MT LIQD
7.0000 mL | Freq: Two times a day (BID) | OROMUCOSAL | Status: DC
  Administered 2015-04-04 – 2015-04-07 (×7): 7 mL via OROMUCOSAL

## 2015-04-04 MED ORDER — SODIUM CHLORIDE 0.9 % IV BOLUS (SEPSIS)
250.0000 mL | Freq: Once | INTRAVENOUS | Status: AC
  Administered 2015-04-04: 250 mL via INTRAVENOUS

## 2015-04-04 MED ORDER — POTASSIUM CHLORIDE 20 MEQ/15ML (10%) PO SOLN
40.0000 meq | Freq: Once | ORAL | Status: AC
  Administered 2015-04-04: 40 meq via ORAL
  Filled 2015-04-04: qty 30

## 2015-04-04 NOTE — Progress Notes (Signed)
PULMONARY / CRITICAL CARE MEDICINE   Name: Deborah [name] MRN: [id number] DOB: 03-30-1946    ADMISSION DATE:  03/31/2015 CONSULTATION DATE:  Wylie Hail  REFERRING MD :  [place]   CHIEF COMPLAINT:  Worsening respiratory failure   INITIAL PRESENTATION:  69 year old female SNF resident w/c bound at baseline. Admitted 7/29 w/ working dx of HCAP and hypoxic resp failure (room air sats 89% on presentation) w/ sepsis associated encephalopathy.  PCCM asked to see on 8/1 for increased resp distress and O2 requirements.    STUDIES:  CT head 8/1: neg  SIGNIFICANT EVENTS: 7/29: admitted w/ working dx of HCAP. 7/30: U strep neg;  Fever spikes to 103, more confused/lethargic SLP eval recommended dysphagia 2 by bedside swallow; felt mild aspiration risk   VITAL SIGNS: Temp:  [97.5 F (36.4 C)-101.5 F (38.6 C)] 98.9 F (37.2 C) (08/02 0745) Pulse Rate:  [64-93] 80 (08/02 0800) Resp:  [15-33] 24 (08/02 0800) BP: (80-145)/(36-70) 114/36 mmHg (08/02 0800) SpO2:  [92 %-100 %] 96 % (08/02 0825) Weight:  [88 kg (194 lb 0.1 oz)] 88 kg (194 lb 0.1 oz) (08/01 1131)  3 liters  Intake/Output Summary (Last 24 hours) at 04/04/15 0949 Last data filed at 04/04/15 0800  Gross per 24 hour  Intake   1470 ml  Output    475 ml  Net    995 ml      VENTILATOR SETTINGS:   INTAKE / OUTPUT:  Intake/Output Summary (Last 24 hours) at 04/04/15 0948 Last data filed at 04/04/15 0800  Gross per 24 hour  Intake   1470 ml  Output    475 ml  Net    995 ml    PHYSICAL EXAMINATION: General:  69 year old female, no acute distress. Appears stated age.   Neuro:  Awake, moves all ext. No focal def  HEENT:  Poor dentition no JVD  Cardiovascular:  rrr Lungs:  Scattered rhonchi R>L. W/ right sided wheeze Abdomen:  Soft, non-tender + bowel sounds  Musculoskeletal:  Intact  Skin:  Intact   LABS:  CBC  Recent Labs Lab 04/02/15 0510 04/03/15 0525 04/04/15 0350  WBC 4.8 4.6 4.1  HGB 10.3* 10.6* 10.6*  HCT  32.1* 33.4* 32.6*  PLT 192 224 253   Coag's No results for input(s): APTT, INR in the last 168 hours. BMET  Recent Labs Lab 03/31/15 1220 04/02/15 0510 04/04/15 0350  NA 133* 134* 135  K 3.8 3.6 3.0*  CL 95* 98* 92*  CO2 29 26 34*  BUN CREATININE 0.94 1.01* 0.97  GLUCOSE 94 87 100*   Electrolytes  Recent Labs Lab 03/31/15 1220 04/02/15 0510 04/04/15 0350  CALCIUM 9.2 8.5* 8.7*   Sepsis Markers  Recent Labs Lab 03/31/15 1211 03/31/15 1526  LATICACIDVEN 1.03 1.07   ABG No results for input(s): PHART, PCO2ART, PO2ART in the last 168 hours. Liver Enzymes  Recent Labs Lab 03/31/15 1220  AST 31  ALT 18  ALKPHOS 35*  BILITOT 0.9  ALBUMIN 3.5   Cardiac Enzymes No results for input(s): TROPONINI, PROBNP in the last 168 hours. Glucose No results for input(s): GLUCAP in the last 168 hours.  Imaging Ct Head Wo Contrast  04/03/2015   CLINICAL DATA:  Altered mental status  EXAM: CT HEAD WITHOUT CONTRAST  TECHNIQUE: Contiguous axial images were obtained from the base of the skull through the vertex without intravenous contrast.  COMPARISON:  May 24, 2014  FINDINGS: Mild diffuse atrophy is  stable. There is encephalomalacia in the right frontal -parietal junction immediately adjacent to the falx at the site of a prior hemorrhage. There is currently no hemorrhage on this study. There is no mass effect, extra-axial fluid collection, or midline shift. There is mild small vessel disease in the centra semiovale bilaterally. There is evidence of a prior small infarct in the anterior right parietal lobe at the gray-white junction, stable. No acute infarct is evident. Maxillary antrum with mild expansion medially. There is mucosal thickening in the left maxillary antrum.  IMPRESSION: Evidence of encephalomalacia at the site of a prior hemorrhage just to the right of the falx at the right frontal -parietal junction. Small infarct anterior right parietal lobe. Atrophy  with mild small vessel disease. No acute infarct. No acute hemorrhage. Probable mucocele in the right maxillary antrum given medial expansion of the maxillary sinus wall.   Electronically Signed   By: Bretta Bang III M.D.   On: 04/03/2015 11:20   Dg Chest Port 1 View  04/03/2015   CLINICAL DATA:  69 year old female with pneumonia, sepsis. Initial encounter.  EXAM: PORTABLE CHEST - 1 VIEW  COMPARISON:  04/02/2015 and earlier.  FINDINGS: Portable AP semi upright view at 0948 hrs. Stable lung volumes. Stable mediastinal contours. Azygos fissure re- identified. Continued streaky opacity at the left lung base, and increased streaky opacity at the right lung base since yesterday. No pneumothorax, pleural effusion or pulmonary edema identified.  IMPRESSION: Increased bibasilar streaky opacity since yesterday could relate collect atelectasis or infection. No pleural effusion identified.   Electronically Signed   By: Odessa Fleming M.D.   On: 04/03/2015 11:10     ASSESSMENT / PLAN:  Acute hypoxic respiratory failure in setting HCAP. Intially room sats 89%; has required escalating O2 support. Can't exclude element of aspiration given risk. Aeration a little worse on CXR. Likely more ATX . Clinically looks a little better today Plan Cont current abx Mobilize/get OOB IS/flutter Add nebs Lasix X 1 Repeat CXR prior to dc  Acute encephalopathy; superimposed on underlying dementia w/ h/o bipolar disease and prior SDH Plan Cont current supportive rx  Anemia of chronic disease  Plan Trend CBC Cont LMWH  HTN Plan Cont clonidine   Nothing to add. Can move out of ICU. We will s/o.    Simonne Martinet ACNP-BC [facility] Pulmonary/Critical Care Pager # [phone] OR # [phone] if no answer   She still has some chest congestion, but breathing is less labored.  Down to 3 liters oxygen by [place].  Sleepy, wakes up easily, follows commands, no wheeze, heart rate regular, abd soft.  Labs show stable anemia, K 3.   Tm 101.5 from 8/01 >> no additional fever since.  Continue current Abx.  F/u CXR intermittently.  Adjust oxygen to keep SpO2 > 92%.  Continue BD's and bronchial hygiene.  Mobilize as tolerated.  PCCM will sign off.  Please call if additional help is needed.  Coralyn Helling, MD [facility] Pulmonary/Critical Care 04/04/2015, 10:30 AM Pager:  [phone] After 3pm call: [phone]

## 2015-04-04 NOTE — Progress Notes (Signed)
Speech Language Pathology Treatment: Dysphagia  Patient Details Name: Deborah [name] MRN: [id number] DOB: 06-25-1946 Today's Date: 04/04/2015 Time: 4098-1191 SLP Time Calculation (min) (ACUTE ONLY): 11 min  Assessment / Plan / Recommendation Clinical Impression  Pt with improved MS to baseline function (impaired cognition) per RN.  Alert, confused, confabulatory. Requires assist with feeding.  Consumed portions of Dys 2 lunch with continued prolonged mastication, with pt removing solids from her mouth intermittently.  Consumed thin liquids by straw with no overt s/s of aspiration.  Recommend continue current diet; f/u for advancement.  D/W RN.   HPI Other Pertinent Information: Deborah [name] is an 69 y.o. 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) who says she has felt bad for about 2 weeks. The patient is a bit confused, and cannot provide a lot of details about her HPI, although she does endorse the recent onset of a productive cough, and is observed to be coughing up yellow/green sputum. Chest x ray revealed mid right lower lobe atelectasis   Pertinent Vitals Pain Assessment: No/denies pain  SLP Plan  Continue with current plan of care    Recommendations Diet recommendations: Dysphagia 2 (fine chop);Thin liquid Liquids provided via: Straw Medication Administration: Crushed with puree Supervision: Staff to assist with self feeding Compensations: Small sips/bites;Slow rate;Follow solids with liquid;Minimize environmental distractions Postural Changes and/or Swallow Maneuvers: Seated upright 90 degrees              Oral Care Recommendations: Oral care BID Plan: Continue with current plan of care    GO     Blenda Mounts Laurice 04/04/2015, 12:49 PM

## 2015-04-04 NOTE — Progress Notes (Signed)
   04/04/15 0005  Vitals  BP (!) 80/58 mmHg  BP Location Left Arm  BP Method Manual  Patient Position (if appropriate) Lying  Pulse Rate 73   NP on call notified of blood pressure reading. Order for 250cc bolus written by Merdis Delay, NP.      04/04/15 0125  Vitals  BP (!) 103/59 mmHg  MAP (mmHg) 66  Pulse Rate 72  ECG Heart Rate 72   Vital signs after bolus infusion. BP 103/59. Will continue to monitor

## 2015-04-04 NOTE — Care Management Important Message (Signed)
Important Message  Patient Details  Name: Deborah [name] MRN: [id number] Date of Birth: 01-07-1946   Medicare Important Message Given:  [facility] notification given    Haskell Flirt 04/04/2015, 2:34 PMImportant Message  Patient Details  Name: Deborah [name] MRN: [id number] Date of Birth: Jan 24, 1946   Medicare Important Message Given:  Yes-second notification given    Haskell Flirt 04/04/2015, 2:34 PM

## 2015-04-04 NOTE — Progress Notes (Signed)
Date:  April 04, 2015 U.R. performed for needs and level of care. Will continue to follow for Case Management needs.  Rhonda Davis, RN, BSN, CCM   [phone] 

## 2015-04-04 NOTE — Progress Notes (Addendum)
PROGRESS NOTE    Deborah [name]:[id number] DOB: [birth year] DOA: 03/31/2015 PCP: [name], MD  HPI/Brief narrative 69 y.o. 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 Patient was hospitalized for possible HCAP. On 8/1, patient noted to have respiratory distress and she was transferred to stepdown unit for close monitoring and management. CCM was consulted.   Assessment/Plan:  Principal Problem:  HCAP (healthcare-associated pneumonia) LLL Vs Aspiration PNA - Pseudomonas Aeruginosa - Patient is a SNF resident, so placed on empiric vancomycin/Zosyn. - Blood cultures 2: Negative to date. Sputum culture: Pseudomonas-called MicroBid lab-sensitivities pending. - Urine pneumococcal antigen: Negative - Aggressive pulmonary toilet. - Speech therapy evaluation appreciated: Diet as below. - On 8/1, nurses reported patient's worsening dyspnea. Chest x-ray showed increasing bibasilar streaky opacity consistent with atelectasis or infiltrates. Due to concern for decline, patient was transferred to stepdown unit and CCM was consulted. Levofloxacin IV was added for atypical coverage. - Clinically improved compared to 8/1. However continues to have intermittent fevers (101.14F last night) despite broad-spectrum IV antibiotics. If she continues to have fevers, consider CT chest abdomen and pelvis and ID consultation to further evaluate. - DC vancomycin. Continue Zosyn and levofloxacin until final sensitivities available.   Active Problems:  Sepsis secondary to healthcare associated versus aspiration pneumonia - Management as above.    Bipolar disorder -  Continue Depakote, Seroquel (dose reduced) and Cymbalta (dose reduced). - Patient was slightly somnolent over the weekend. Ammonia level normal. Marginally reduced Seroquel and Cymbalta doses. Monitor closely. - Mental status improved.   Essential hypertension - Continue clonidine, metoprolol and Cozaar. - Controlled   Moderate dementia - Continue Aricept.  Acute encephalopathy - Most likely secondary to acute illness complicating underlying dementia, prior neuro deficits and bipolar disorder. No focal deficits seen. - Ammonia levels: 20 - Reduce Seroquel and Cymbalta doses marginally. - Mental status improved - CT head without acute findings. No acute infarct or hemorrhage. Findings of an careful malacia in right frontal parietal region-area of prior hemorrhage.  Dysphagia - Speech therapy input appreciated. Started on dysphagia 2 diet and thin liquids.  Anemia - Stable  Hypokalemia - Likely secondary to diuresis on 8/1. Replace and follow    DVT prophylaxis: - Lovenox Code Status: Full. Family Communication: Discussed with patient's daughter-in-law (Mr. Sadira Standard spouse) on 8/2. Mr. Mergenthaler was at work. Disposition Plan: Transferred to stepdown unit on 7/31. DC SNF when medically stable. Continue management in stepdown unit for additional 24 hours.   Consultants:  CCM-signed off 8/2.  Procedures:  Foley catheter 8/1 >  Antibiotics:  IV vancomycin 7/29 > 8/2  IV Zosyn 7/29 >  IV levofloxacin 8/1 >  Subjective: Pleasantly confused. Denied dyspnea. Still has productive cough.  Objective: Filed Vitals:   04/04/15 0737 04/04/15 0745 04/04/15 0800 04/04/15 0825  BP:   114/36   Pulse: 76  80   Temp:  98.9 F (37.2 C)    TempSrc:  Oral    Resp: 19  24   Height:      Weight:      SpO2: 98%  97% 96%    Intake/Output Summary (Last 24 hours) at 04/04/15 1045 Last data filed at 04/04/15 0800  Gross per  24 hour  Intake   1470 ml  Output    475 ml    Net    995 ml   Filed Weights   03/31/15 1208 04/03/15 1131  Weight: 99.791 kg (220 lb) 88 kg (194 lb 0.1 oz)     Exam:  General exam: Moderately built and obese pleasant elderly female lying propped up in bed without distress. Looks much better than she did on 8/1.  Respiratory system: Improved breath sounds. Slightly diminished in the bases with occasional basal crackles. No rhonchi or wheezing. No increased work of breathing.  Cardiovascular system: S1 & S2 heard, RRR. No JVD, murmurs, gallops, clicks or pedal edema. Telemetry: Sinus rhythm. Gastrointestinal system: Abdomen is nondistended, soft and nontender. Normal bowel sounds heard. Central nervous system: Alert and oriented only to self and partly to place. No focal neurological deficits. Extremities: Symmetric 5 x 5 power.   Data Reviewed: Basic Metabolic Panel:  Recent Labs Lab 03/31/15 1220 04/02/15 0510 04/04/15 0350  NA 133* 134* 135  K 3.8 3.6 3.0*  CL 95* 98* 92*  CO2 29 26 34*  GLUCOSE 94 87 100*  BUN 16 12 11   CREATININE 0.94 1.01* 0.97  CALCIUM 9.2 8.5* 8.7*   Liver Function Tests:  Recent Labs Lab 03/31/15 1220  AST 31  ALT 18  ALKPHOS 35*  BILITOT 0.9  PROT 7.2  ALBUMIN 3.5   No results for input(s): LIPASE, AMYLASE in the last 168 hours.  Recent Labs Lab 04/01/15 1540  AMMONIA 20   CBC:  Recent Labs Lab 03/31/15 1220 04/02/15 0510 04/03/15 0525 04/04/15 0350  WBC 5.7 4.8 4.6 4.1  NEUTROABS 3.4  --   --   --   HGB 12.1 10.3* 10.6* 10.6*  HCT 36.8 32.1* 33.4* 32.6*  MCV 86.0 85.4 85.6 86.2  PLT 247 192 224 253   Cardiac Enzymes: No results for input(s): CKTOTAL, CKMB, CKMBINDEX, TROPONINI in the last 168 hours. BNP (last 3 results) No results for input(s): PROBNP in the last 8760 hours. CBG: No results for input(s): GLUCAP in the last 168 hours.  Recent Results (from the past 240 hour(s))  Culture, blood (routine x 2)     Status: None (Preliminary result)   Collection  Time: 03/31/15 12:10 PM  Result Value Ref Range Status   Specimen Description BLOOD RIGHT ANTECUBITAL  Final   Special Requests BOTTLES DRAWN AEROBIC AND ANAEROBIC 5CC  Final   Culture   Final    NO GROWTH 3 DAYS Performed at [facility]    Report Status PENDING  Incomplete  Culture, blood (routine x 2)     Status: None (Preliminary result)   Collection Time: 03/31/15 12:13 PM  Result Value Ref Range Status   Specimen Description BLOOD RIGHT FOREARM  Final   Special Requests BOTTLES DRAWN AEROBIC AND ANAEROBIC  Final   Culture   Final    NO GROWTH 3 DAYS Performed at [facility]    Report Status PENDING  Incomplete  Urine culture     Status: None   Collection Time: 03/31/15 12:38 PM  Result Value Ref Range Status   Specimen Description URINE, CLEAN CATCH  Final   Special Requests NONE  Final   Culture   Final    MULTIPLE SPECIES PRESENT, SUGGEST RECOLLECTION Performed at [facility]    Report Status 04/02/2015 FINAL  Final  Culture, sputum-assessment     Status: None   Collection Time: 03/31/15  4:13  PM  Result Value Ref Range Status   Specimen Description Expect. Sput  Final   Special Requests Normal  Final   Sputum evaluation   Final    THIS SPECIMEN IS ACCEPTABLE. RESPIRATORY CULTURE REPORT TO FOLLOW.   Report Status 03/31/2015 FINAL  Final  Culture, respiratory (NON-Expectorated)     Status: None   Collection Time: 03/31/15  5:00 PM  Result Value Ref Range Status   Specimen Description NOSE  Final   Special Requests NONE  Final   Gram Stain   Final    MODERATE WBC PRESENT,BOTH PMN AND MONONUCLEAR NO SQUAMOUS EPITHELIAL CELLS SEEN FEW GRAM NEGATIVE RODS FEW GRAM NEGATIVE COCCI Performed at [facility]    Culture   Final    MODERATE PSEUDOMONAS AERUGINOSA Performed at [facility]    Report Status 04/04/2015 FINAL  Final  MRSA PCR Screening     Status: None   Collection Time: 04/03/15 11:20 AM  Result Value Ref  Range Status   MRSA by PCR NEGATIVE NEGATIVE Final    Comment:        The GeneXpert MRSA Assay (FDA approved for NASAL specimens only), is one component of a comprehensive MRSA colonization surveillance program. It is not intended to diagnose MRSA infection nor to guide or monitor treatment for MRSA infections.            Studies: Ct Head Wo Contrast  04/03/2015   CLINICAL DATA:  Altered mental status  EXAM: CT HEAD WITHOUT CONTRAST  TECHNIQUE: Contiguous axial images were obtained from the base of the skull through the vertex without intravenous contrast.  COMPARISON:  May 24, 2014  FINDINGS: Mild diffuse atrophy is stable. There is encephalomalacia in the right frontal -parietal junction immediately adjacent to the falx at the site of a prior hemorrhage. There is currently no hemorrhage on this study. There is no mass effect, extra-axial fluid collection, or midline shift. There is mild small vessel disease in the centra semiovale bilaterally. There is evidence of a prior small infarct in the anterior right parietal lobe at the gray-white junction, stable. No acute infarct is evident. Maxillary antrum with mild expansion medially. There is mucosal thickening in the left maxillary antrum.  IMPRESSION: Evidence of encephalomalacia at the site of a prior hemorrhage just to the right of the falx at the right frontal -parietal junction. Small infarct anterior right parietal lobe. Atrophy with mild small vessel disease. No acute infarct. No acute hemorrhage. Probable mucocele in the right maxillary antrum given medial expansion of the maxillary sinus wall.   Electronically Signed   By: Bretta Bang III M.D.   On: 04/03/2015 11:20   Dg Chest Port 1 View  04/03/2015   CLINICAL DATA:  69 year old female with pneumonia, sepsis. Initial encounter.  EXAM: PORTABLE CHEST - 1 VIEW  COMPARISON:  04/02/2015 and earlier.  FINDINGS: Portable AP semi upright view at 0948 hrs. Stable lung volumes.  Stable mediastinal contours. Azygos fissure re- identified. Continued streaky opacity at the left lung base, and increased streaky opacity at the right lung base since yesterday. No pneumothorax, pleural effusion or pulmonary edema identified.  IMPRESSION: Increased bibasilar streaky opacity since yesterday could relate collect atelectasis or infection. No pleural effusion identified.   Electronically Signed   By: Odessa Fleming M.D.   On: 04/03/2015 11:10        Scheduled Meds: . allopurinol  100 mg Oral Daily  . budesonide (PULMICORT) nebulizer solution  0.25 mg Nebulization BID  .  cholecalciferol  2,000 Units Oral Daily  . divalproex  250 mg Oral Daily  . divalproex  500 mg Oral QHS  . donepezil  10 mg Oral QHS  . DULoxetine  40 mg Oral Daily  . enoxaparin (LOVENOX) injection  40 mg Subcutaneous Q24H  . ezetimibe  10 mg Oral Daily  . famotidine  20 mg Oral Daily  . feeding supplement (ENSURE ENLIVE)  237 mL Oral BID BM  . fenofibrate  160 mg Oral Daily  . guaiFENesin  1,200 mg Oral BID  . ipratropium-albuterol  3 mL Nebulization TID  . levofloxacin (LEVAQUIN) IV  750 mg Intravenous Daily  . losartan  100 mg Oral Daily  . metoprolol succinate  100 mg Oral Daily  . multivitamin-prenatal  1 tablet Oral Q1200  . piperacillin-tazobactam (ZOSYN)  IV  3.375 g Intravenous Q8H  . QUEtiapine  12.5 mg Oral QHS   Continuous Infusions:    Principal Problem:   HCAP (healthcare-associated pneumonia) Active Problems:   Bipolar disorder   Essential hypertension   Moderate dementia    Time spent: 45 minutes.    Marcellus Scott, MD, FACP, FHM. [facility] Pager [phone]  If 7PM-7AM, please contact night-coverage [email or web address] Password [email or web address] 04/04/2015, 10:45 AM    LOS: 4 days

## 2015-04-04 NOTE — Progress Notes (Signed)
CSW assisting with d/c planning. Pt transferred to stepdown on 04/03/15. She is from [facility] ( memory care unit ). Met with pt / daughter this am. Pt / family are hopeful that pt can return to [facility] when stable for d/c. Facility had been contacted by Barnabas Lister LCSW prior to transfer to stepdown. [facility] is able to readmit  pt provided she returns to prior level of functioning. ( see LCSW note dated 04/03/15 ). PT eval  recommended  when medically appropriate. CSW will continue to follow to assist with d/c planning.  Werner Lean LCSW [phone]

## 2015-04-05 ENCOUNTER — Inpatient Hospital Stay (HOSPITAL_COMMUNITY): Payer: Medicare Other

## 2015-04-05 DIAGNOSIS — F0390 Unspecified dementia without behavioral disturbance: Secondary | ICD-10-CM

## 2015-04-05 DIAGNOSIS — E876 Hypokalemia: Secondary | ICD-10-CM

## 2015-04-05 DIAGNOSIS — G9340 Encephalopathy, unspecified: Secondary | ICD-10-CM

## 2015-04-05 DIAGNOSIS — J189 Pneumonia, unspecified organism: Secondary | ICD-10-CM

## 2015-04-05 LAB — BASIC METABOLIC PANEL
ANION GAP: 11 (ref 5–15)
BUN: 18 mg/dL (ref 6–20)
CHLORIDE: 91 mmol/L — AB (ref 101–111)
CO2: 33 mmol/L — AB (ref 22–32)
Calcium: 9 mg/dL (ref 8.9–10.3)
Creatinine, Ser: 0.98 mg/dL (ref 0.44–1.00)
GFR calc Af Amer: >60 mL/min (ref >60)
GFR calc non Af Amer: 57 mL/min — ABNORMAL LOW (ref >60)
Glucose, Bld: 101 mg/dL — ABNORMAL HIGH (ref 65–99)
Potassium: 3.7 mmol/L (ref 3.5–5.1)
SODIUM: 135 mmol/L (ref 135–145)

## 2015-04-05 LAB — CULTURE, BLOOD (ROUTINE X 2)
CULTURE: NO GROWTH
Culture: NO GROWTH

## 2015-04-05 NOTE — Progress Notes (Signed)
SLP Cancellation Note  Patient Details Name: Deborah [name] MRN: [id number] DOB: 24-Jan-1946   Cancelled treatment:       Reason Eval/Treat Not Completed: Other (comment);Patient at procedure or test/unavailable (pt off to xray currenlty)   Donavan Burnet, MS [facility] SLP [phone]

## 2015-04-05 NOTE — Progress Notes (Signed)
Clinical Social Work  CSW spoke with MD and requested PT consult. CSW will follow up after consult to determine if patient can return to ALF or needs a higher level of care.  [place], [place] [phone]

## 2015-04-05 NOTE — Evaluation (Signed)
Physical Therapy Evaluation Patient Details Name: Deborah [name] MRN: [id number] DOB: 20-Apr-1946 Today's Date: 04/05/2015   History of Present Illness  69 y.o. 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 Pt currently with functional limitations due to the deficits listed below (see PT Problem List).  Pt will benefit from skilled PT to increase their independence and safety with mobility to allow discharge to the venue listed below.   Pt from ALF and currently present mod assist for transfers at this time.  Pt assisted to standing a couple times, for pericare, and then transferred to recliner.  If ALF unable to provide current assist then pt may need SNF.     Follow Up Recommendations Home health PT;Supervision/Assistance - 24 hour    Equipment Recommendations  None recommended by PT    Recommendations for Other Services       Precautions / Restrictions Precautions Precautions: Fall      Mobility  Bed Mobility Overal bed mobility: Needs Assistance Bed Mobility: Supine to Sit     Supine to sit: Min assist;HOB elevated     General bed mobility comments: verbal cues and step by step instructions for pt to better self assist  Transfers Overall transfer level: Needs assistance Equipment used: 1 person hand held assist Transfers: Sit to/from Stand;Stand Pivot Transfers Sit to Stand: Min assist;From elevated surface Stand pivot transfers: Mod assist       General transfer comment: assisted to standing x2 for pericare and transferred to recliner with pt reaching for armrest to assist, assist to control descent while  turning  Ambulation/Gait Ambulation/Gait assistance:  (nonambulatory)              Stairs            Wheelchair Mobility    Modified Rankin (Stroke Patients Only)       Balance                                             Pertinent Vitals/Pain Pain Assessment: No/denies pain  Remained on O2 [place]    Home Living Family/patient expects to be discharged to:: Assisted living                 Additional Comments: pt poor historian, per chart review, from ALF, typically assisted to w/c and able to propel self per CSW    Prior Function Level of Independence: Needs assistance   Gait / Transfers Assistance Needed: transfer to w/c     Comments:  typically assisted to w/c and able to propel self per CSW (per ALF)     Hand Dominance        Extremity/Trunk Assessment   Upper Extremity Assessment: Generalized weakness           Lower Extremity Assessment: Generalized weakness         Communication   Communication: Expressive difficulties  Cognition Arousal/Alertness: Awake/alert Behavior During Therapy: WFL for tasks assessed/performed Overall Cognitive Status: No family/caregiver present to determine baseline cognitive functioning Area of Impairment: Orientation;Following commands Orientation Level: Disoriented to;Place;Time;Situation   Memory: Decreased short-term memory Following Commands:  Follows one step commands with increased time            General Comments      Exercises        Assessment/Plan    PT Assessment Patient needs continued PT services  PT Diagnosis Generalized weakness   PT Problem List Decreased strength;Decreased activity tolerance;Decreased mobility;Decreased cognition  PT Treatment Interventions DME instruction;Functional mobility training;Patient/family education;Therapeutic activities;Therapeutic exercise;Wheelchair mobility training   PT Goals (Current goals can be found in the Care Plan  section) Acute Rehab PT Goals PT Goal Formulation: Patient unable to participate in goal setting Time For Goal Achievement: 04/12/15 Potential to Achieve Goals: Fair    Frequency Min 2X/week   Barriers to discharge        Co-evaluation               End of Session Equipment Utilized During Treatment: Gait belt Activity Tolerance: Patient limited by fatigue Patient left: with call bell/phone within reach;in chair Nurse Communication: Mobility status;Need for lift equipment (NT aware pt in chair and lift pad under pt)         Time: 5409-8119 PT Time Calculation (min) (ACUTE ONLY): 27 min   Charges:   PT Evaluation $Initial PT Evaluation Tier I: 1 Procedure PT Treatments $Therapeutic Activity: 8-22 mins   PT G Codes:        Javaeh Muscatello,KATHrine E 04/05/2015, 4:09 PM Zenovia Jarred, PT, DPT 04/05/2015 Pager: [phone]

## 2015-04-05 NOTE — Progress Notes (Signed)
PROGRESS NOTE    Deborah [name]:[id number] DOB: Feb 03, 1946 DOA: 03/31/2015 PCP: [name], MD  HPI/Brief narrative 69 y.o. 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 Patient was hospitalized for possible HCAP. On 8/1, patient noted to have respiratory distress and she was transferred to stepdown unit for close monitoring and management. CCM was consulted.   Assessment/Plan:  Principal Problem:  HCAP (healthcare-associated pneumonia) LLL Vs Aspiration PNA - Pseudomonas Aeruginosa - Patient is a SNF resident initially treated with vancomycin/Zosyn. - Blood cultures 2: Negative to date.  -Sputum culture positive for Pseudomonas, organism susceptible to fluoroquinolones - On 8/1, nurses reported patient's worsening dyspnea. Chest x-ray showed increasing bibasilar streaky opacity consistent with atelectasis or infiltrates. Due to concern for decline, patient was transferred to stepdown unit and CCM was consulted. Levofloxacin IV was added for atypical coverage. -Patient having MAXIMUM TEMPERATURE of 99.2 overnight -Remains hemodynamically stable, currently has an O2 sat of 98% -We'll transferred to telemetry   Active Problems:  Sepsis secondary to healthcare associated versus aspiration pneumonia -Plan to continue IV antibiotic coverage with Zosyn and Levaquin -Blood cultures showing no growth to date however sputum cultures grew Pseudomonas    Bipolar disorder - Continue Depakote, Seroquel (dose reduced) and Cymbalta (dose reduced). -Ammonia level normal. Marginally reduced Seroquel and Cymbalta doses. Monitor closely.   Essential  hypertension - Continue clonidine, metoprolol and Cozaar. - Controlled   Moderate dementia - Continue Aricept. -Patient remains confused and disoriented  Acute encephalopathy - Most likely secondary to acute illness complicating underlying dementia, prior neuro deficits and bipolar disorder. No focal deficits seen. - Ammonia levels: 20 - Reduce Seroquel and Cymbalta doses marginally. - CT head without acute findings. No acute infarct or hemorrhage. Findings of an careful malacia in right frontal parietal region-area of prior hemorrhage.  Dysphagia - Speech therapy input appreciated. Started on dysphagia 2 diet and thin liquids.  Anemia - Stable  Hypokalemia - A.m. labs show potassium of 3.7 after receiving potassium replacement  DVT prophylaxis: - Lovenox Code Status: Full. Family Communication:  Disposition Plan: Given clinical stability will transferred to telemetry   Consultants:  CCM-signed off 8/2.  Procedures:  Foley catheter 8/1 >  Antibiotics:  IV vancomycin 7/29 > 8/2  IV Zosyn 7/29 >  IV levofloxacin 8/1 >  Subjective: Patient is confused and disoriented, states that she is in [facility]. Otherwise awake and alert, pleasant, following commands  Objective: Filed Vitals:   04/04/15 2305 04/05/15 0000 04/05/15 0428 04/05/15 0700  BP: 123/57 93/75 93/38    Pulse: 86 87  73  Temp:  98.3 F (36.8 C)    TempSrc:  Oral    Resp: 21 20 17 19   Height:      Weight:      SpO2: 97% 95%  98%    Intake/Output Summary (Last 24 hours) at 04/05/15 0750 Last data filed at 04/05/15 0720  Gross per 24 hour  Intake   1145 ml  Output   2765 ml  Net  -1620 ml   Filed Weights   03/31/15 1208 04/03/15 1131  Weight: 99.791 kg (220 lb) 88 kg (194 lb 0.1 oz)     Exam:  General exam:  She is awake and alert, confused, disoriented  Respiratory system: She has bibasilar crackles, normal respiratory effort Cardiovascular system: S1 & S2 heard, RRR.  No JVD, murmurs, gallops, clicks or pedal edema. Telemetry: Sinus rhythm. Gastrointestinal system: Abdomen is nondistended, soft and nontender. Normal bowel sounds heard. Central nervous system: Alert and oriented only to self and partly to place. No focal neurological deficits. Extremities: Symmetric 5 x 5 power.   Data Reviewed: Basic Metabolic Panel:  Recent Labs Lab 03/31/15 1220 04/02/15 0510 04/04/15 0350 04/05/15 0335  NA 133* 134* 135 135  K 3.8 3.6 3.0* 3.7  CL 95* 98* 92* 91*  CO2 29 26 34* 33*  GLUCOSE 94 87 100* 101*  BUN CREATININE 0.94 1.01* 0.97 0.98  CALCIUM 9.2 8.5* 8.7* 9.0   Liver Function Tests:  Recent Labs Lab 03/31/15 1220  AST 31  ALT 18  ALKPHOS 35*  BILITOT 0.9  PROT 7.2  ALBUMIN 3.5   No results for input(s): LIPASE, AMYLASE in the last 168 hours.  Recent Labs Lab 04/01/15 1540  AMMONIA 20   CBC:  Recent Labs Lab 03/31/15 1220 04/02/15 0510 04/03/15 0525 04/04/15 0350  WBC 5.7 4.8 4.6 4.1  NEUTROABS 3.4  --   --   --   HGB 12.1 10.3* 10.6* 10.6*  HCT 36.8 32.1* 33.4* 32.6*  MCV 86.0 85.4 85.6 86.2  PLT 247 192 224 253   Cardiac Enzymes: No results for input(s): CKTOTAL, CKMB, CKMBINDEX, TROPONINI in the last 168 hours. BNP (last 3 results) No results for input(s): PROBNP in the last 8760 hours. CBG: No results for input(s): GLUCAP in the last 168 hours.  Recent Results (from the past 240 hour(s))  Culture, blood (routine x 2)     Status: None (Preliminary result)   Collection Time: 03/31/15 12:10 PM  Result Value Ref Range Status   Specimen Description BLOOD RIGHT ANTECUBITAL  Final   Special Requests BOTTLES DRAWN AEROBIC AND ANAEROBIC 5CC  Final   Culture   Final    NO GROWTH 4 DAYS Performed at [facility]    Report Status PENDING  Incomplete  Culture, blood (routine x 2)     Status: None (Preliminary result)   Collection Time: 03/31/15 12:13 PM  Result Value Ref Range Status   Specimen  Description BLOOD RIGHT FOREARM  Final   Special Requests BOTTLES DRAWN AEROBIC AND ANAEROBIC  Final   Culture   Final    NO GROWTH 4 DAYS Performed at [facility]    Report Status PENDING  Incomplete  Urine culture     Status: None   Collection Time: 03/31/15 12:38 PM  Result Value Ref Range Status   Specimen Description URINE, CLEAN CATCH  Final   Special Requests NONE  Final   Culture   Final    MULTIPLE SPECIES PRESENT, SUGGEST RECOLLECTION Performed at [facility]    Report Status 04/02/2015 FINAL  Final  Culture, sputum-assessment     Status: None   Collection Time: 03/31/15  4:13 PM  Result Value Ref Range Status   Specimen Description Expect. Sput  Final   Special Requests Normal  Final   Sputum evaluation   Final    THIS SPECIMEN IS ACCEPTABLE. RESPIRATORY CULTURE REPORT TO FOLLOW.   Report Status 03/31/2015 FINAL  Final  Culture, respiratory (NON-Expectorated)     Status: None   Collection Time: 03/31/15  5:00 PM  Result Value Ref Range Status  Specimen Description SPUTUM  Final   Special Requests NONE Performed at [facility]   Final   Gram Stain   Final    MODERATE WBC PRESENT,BOTH PMN AND MONONUCLEAR NO SQUAMOUS EPITHELIAL CELLS SEEN FEW GRAM NEGATIVE RODS FEW GRAM NEGATIVE COCCI Performed at [facility]    Culture   Final    MODERATE PSEUDOMONAS AERUGINOSA Performed at [facility]    Report Status 04/04/2015 FINAL  Final   Organism ID, Bacteria PSEUDOMONAS AERUGINOSA  Final      Susceptibility   Pseudomonas aeruginosa - MIC*    CEFEPIME 2 SENSITIVE Sensitive     CEFTAZIDIME 4 SENSITIVE Sensitive     CIPROFLOXACIN <=0.25 SENSITIVE Sensitive     GENTAMICIN <=1 SENSITIVE Sensitive     IMIPENEM 2 SENSITIVE Sensitive     PIP/TAZO 8 SENSITIVE Sensitive     TOBRAMYCIN <=1 SENSITIVE Sensitive     * MODERATE PSEUDOMONAS AERUGINOSA  MRSA PCR Screening     Status: None   Collection Time: 04/03/15 11:20 AM   Result Value Ref Range Status   MRSA by PCR NEGATIVE NEGATIVE Final    Comment:        The GeneXpert MRSA Assay (FDA approved for NASAL specimens only), is one component of a comprehensive MRSA colonization surveillance program. It is not intended to diagnose MRSA infection nor to guide or monitor treatment for MRSA infections.            Studies: Ct Head Wo Contrast  04/03/2015   CLINICAL DATA:  Altered mental status  EXAM: CT HEAD WITHOUT CONTRAST  TECHNIQUE: Contiguous axial images were obtained from the base of the skull through the vertex without intravenous contrast.  COMPARISON:  May 24, 2014  FINDINGS: Mild diffuse atrophy is stable. There is encephalomalacia in the right frontal -parietal junction immediately adjacent to the falx at the site of a prior hemorrhage. There is currently no hemorrhage on this study. There is no mass effect, extra-axial fluid collection, or midline shift. There is mild small vessel disease in the centra semiovale bilaterally. There is evidence of a prior small infarct in the anterior right parietal lobe at the gray-white junction, stable. No acute infarct is evident. Maxillary antrum with mild expansion medially. There is mucosal thickening in the left maxillary antrum.  IMPRESSION: Evidence of encephalomalacia at the site of a prior hemorrhage just to the right of the falx at the right frontal -parietal junction. Small infarct anterior right parietal lobe. Atrophy with mild small vessel disease. No acute infarct. No acute hemorrhage. Probable mucocele in the right maxillary antrum given medial expansion of the maxillary sinus wall.   Electronically Signed   By: Bretta Bang III M.D.   On: 04/03/2015 11:20   Dg Chest Port 1 View  04/03/2015   CLINICAL DATA:  69 year old female with pneumonia, sepsis. Initial encounter.  EXAM: PORTABLE CHEST - 1 VIEW  COMPARISON:  04/02/2015 and earlier.  FINDINGS: Portable AP semi upright view at 0948 hrs.  Stable lung volumes. Stable mediastinal contours. Azygos fissure re- identified. Continued streaky opacity at the left lung base, and increased streaky opacity at the right lung base since yesterday. No pneumothorax, pleural effusion or pulmonary edema identified.  IMPRESSION: Increased bibasilar streaky opacity since yesterday could relate collect atelectasis or infection. No pleural effusion identified.   Electronically Signed   By: Odessa Fleming M.D.   On: 04/03/2015 11:10        Scheduled Meds: . allopurinol  100 mg Oral Daily  . antiseptic oral rinse  7 mL Mouth Rinse BID  . budesonide (PULMICORT) nebulizer solution  0.25 mg Nebulization BID  . cholecalciferol  2,000 Units Oral Daily  . divalproex  250 mg Oral Daily  . divalproex  500 mg Oral QHS  . donepezil  10 mg Oral QHS  . DULoxetine  40 mg Oral Daily  . enoxaparin (LOVENOX) injection  40 mg Subcutaneous Q24H  . ezetimibe  10 mg Oral Daily  . famotidine  20 mg Oral Daily  . feeding supplement (ENSURE ENLIVE)  237 mL Oral BID BM  . fenofibrate  160 mg Oral Daily  . guaiFENesin  1,200 mg Oral BID  . ipratropium-albuterol  3 mL Nebulization TID  . levofloxacin (LEVAQUIN) IV  750 mg Intravenous Daily  . losartan  100 mg Oral Daily  . metoprolol succinate  100 mg Oral Daily  . multivitamin-prenatal  1 tablet Oral Q1200  . piperacillin-tazobactam (ZOSYN)  IV  3.375 g Intravenous Q8H  . QUEtiapine  12.5 mg Oral QHS   Continuous Infusions:    Principal Problem:   HCAP (healthcare-associated pneumonia) Active Problems:   Bipolar disorder   Essential hypertension   Moderate dementia   Acute encephalopathy   Hypokalemia    Time spent: 35 minutes.    Jeralyn Bennett, MD, FACP, FHM. [facility] Pager [phone]  If 7PM-7AM, please contact night-coverage [email or web address] Password [email or web address] 04/05/2015, 7:50 AM    LOS: 5 days

## 2015-04-06 DIAGNOSIS — I10 Essential (primary) hypertension: Secondary | ICD-10-CM

## 2015-04-06 LAB — BASIC METABOLIC PANEL
Anion gap: 8 (ref 5–15)
BUN: 17 mg/dL (ref 6–20)
CHLORIDE: 95 mmol/L — AB (ref 101–111)
CO2: 32 mmol/L (ref 22–32)
Calcium: 9.4 mg/dL (ref 8.9–10.3)
Creatinine, Ser: 0.88 mg/dL (ref 0.44–1.00)
Glucose, Bld: 87 mg/dL (ref 65–99)
POTASSIUM: 4.4 mmol/L (ref 3.5–5.1)
Sodium: 135 mmol/L (ref 135–145)

## 2015-04-06 LAB — CBC
HCT: 36.2 % (ref 36.0–46.0)
Hemoglobin: 11.8 g/dL — ABNORMAL LOW (ref 12.0–15.0)
MCH: 27.6 pg (ref 26.0–34.0)
MCHC: 32.6 g/dL (ref 30.0–36.0)
MCV: 84.6 fL (ref 78.0–100.0)
Platelets: 342 10*3/uL (ref 150–400)
RBC: 4.28 MIL/uL (ref 3.87–5.11)
RDW: 15.8 % — ABNORMAL HIGH (ref 11.5–15.5)
WBC: 5.2 10*3/uL (ref 4.0–10.5)

## 2015-04-06 MED ORDER — AMOXICILLIN-POT CLAVULANATE ER 1000-62.5 MG PO TB12
2.0000 | ORAL_TABLET | Freq: Two times a day (BID) | ORAL | Status: DC
  Administered 2015-04-07: 2 via ORAL
  Filled 2015-04-06 (×2): qty 2

## 2015-04-06 NOTE — Progress Notes (Signed)
Speech Language Pathology Treatment: Dysphagia  Patient Details Name: Deborah [name] MRN: [id number] DOB: 03-14-1946 Today's Date: 04/06/2015 Time: 1250-1310 SLP Time Calculation (min) (ACUTE ONLY): 20 min  Assessment / Plan / Recommendation Clinical Impression  Pt seen to assess tolerance of po diet and readiness for dietary advancement. Note pt CXR from yesterday improved.  Pt with apparent cognitive issues asking SLP why she is here and current location.  Nurse tech reports pt had asked these questions earlier today.  Nurse tech states pt consumed adequate intake without significant difficulties - cough x1 with breakfast.  RN reports pt tolerating medicine crushed with applesauce - pt is masticating pills that are not crushable per RN.    SLP assisted pt to consume Ensure, water and moistened graham cracker.  Cough x1 with sequential boluses of Ensure - improved tolerance of SMALL SINGLE boluses.  Adequate mastication of moist cracker noted.  Due to pt's mentation and poor dentition, recommend continue current diet with strict aspiration precautions.    HPI Other Pertinent Information: Deborah [name] is an 69 y.o. 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 The patient is a bit confused, and cannot provide a lot of details about her HPI, although she does endorse the recent onset of a productive cough, and is observed to be coughing up yellow/green sputum. Chest x ray revealed mid right lower lobe atelectasis.  CXR improving as of 04/05/15 per chart review.     Pertinent Vitals Pain Assessment: No/denies pain  SLP Plan  Continue with current plan of care    Recommendations Diet recommendations: Dysphagia 2 (fine chop);Thin liquid Liquids provided via: Straw;Cup Medication Administration: Crushed with puree Supervision: Staff to  assist with self feeding Compensations: Small sips/bites;Slow rate;Follow solids with liquid;Minimize environmental distractions (check for oral residuals) Postural Changes and/or Swallow Maneuvers: Seated upright 90 degrees              Oral Care Recommendations: Oral care BID Follow up Recommendations: Skilled Nursing facility Plan: Continue with current plan of care    GO    Donavan Burnet, MS [facility] SLP [phone]

## 2015-04-06 NOTE — Progress Notes (Addendum)
ANTIBIOTIC CONSULT NOTE - Follow Up  Pharmacy Consult for Levofloxacin Indication: Pneumonia (pseudomonas)  Allergies  Allergen Reactions  . Bee Venom Anaphylaxis  . Dicyclomine Hcl Anaphylaxis  . Omeprazole Other (See Comments)     GI upset.  . Other Other (See Comments)    Reaction to reductase inhibitors per MAR  . Statins Other (See Comments)    myalgias    Patient Measurements: Height: 5\' 4"  (162.6 cm) Weight: 194 lb 0.1 oz (88 kg) IBW/kg (Calculated) : 54.7  Vital Signs: Temp: 97.9 F (36.6 C) (08/04 0512) Temp Source: Axillary (08/04 0512) BP: 125/54 mmHg (08/04 0512) Pulse Rate: 89 (08/04 0512) Intake/Output from previous day: 08/03 0701 - 08/04 0700 In: 170 [P.O.:150] Out: 700 [Urine:700] Intake/Output from this shift: Total I/O In: 120 [P.O.:120] Out: 200 [Urine:200]  Labs:  Recent Labs  04/04/15 0350 04/05/15 0335 04/06/15 0515  WBC 4.1  --  5.2  HGB 10.6*  --  11.8*  PLT 253  --  342  CREATININE 0.97 0.98 0.88   Estimated Creatinine Clearance: 63.9 mL/min (by C-G formula based on Cr of 0.88). No results for input(s): VANCOTROUGH, VANCOPEAK, VANCORANDOM, GENTTROUGH, GENTPEAK, GENTRANDOM, TOBRATROUGH, TOBRAPEAK, TOBRARND, AMIKACINPEAK, AMIKACINTROU, AMIKACIN in the last 72 hours.   Microbiology: Recent Results (from the past 720 hour(s))  Culture, blood (routine x 2)     Status: None   Collection Time: 03/31/15 12:10 PM  Result Value Ref Range Status   Specimen Description BLOOD RIGHT ANTECUBITAL  Final   Special Requests BOTTLES DRAWN AEROBIC AND ANAEROBIC 5CC  Final   Culture   Final    NO GROWTH 5 DAYS Performed at [facility]    Report Status 04/05/2015 FINAL  Final  Culture, blood (routine x 2)     Status: None   Collection Time: 03/31/15 12:13 PM  Result Value Ref Range Status   Specimen Description BLOOD RIGHT FOREARM  Final   Special Requests BOTTLES DRAWN AEROBIC AND ANAEROBIC  Final   Culture   Final    NO GROWTH 5  DAYS Performed at [facility]    Report Status 04/05/2015 FINAL  Final  Urine culture     Status: None   Collection Time: 03/31/15 12:38 PM  Result Value Ref Range Status   Specimen Description URINE, CLEAN CATCH  Final   Special Requests NONE  Final   Culture   Final    MULTIPLE SPECIES PRESENT, SUGGEST RECOLLECTION Performed at [facility]    Report Status 04/02/2015 FINAL  Final  Culture, sputum-assessment     Status: None   Collection Time: 03/31/15  4:13 PM  Result Value Ref Range Status   Specimen Description Expect. Sput  Final   Special Requests Normal  Final   Sputum evaluation   Final    THIS SPECIMEN IS ACCEPTABLE. RESPIRATORY CULTURE REPORT TO FOLLOW.   Report Status 03/31/2015 FINAL  Final  Culture, respiratory (NON-Expectorated)     Status: None   Collection Time: 03/31/15  5:00 PM  Result Value Ref Range Status   Specimen Description SPUTUM  Final   Special Requests NONE Performed at [facility]   Final   Gram Stain   Final    MODERATE WBC PRESENT,BOTH PMN AND MONONUCLEAR NO SQUAMOUS EPITHELIAL CELLS SEEN FEW GRAM NEGATIVE RODS FEW GRAM NEGATIVE COCCI Performed at [facility]    Culture   Final    MODERATE PSEUDOMONAS AERUGINOSA Performed at [facility]  Report Status 04/04/2015 FINAL  Final   Organism ID, Bacteria PSEUDOMONAS AERUGINOSA  Final      Susceptibility   Pseudomonas aeruginosa - MIC*    CEFEPIME 2 SENSITIVE Sensitive     CEFTAZIDIME 4 SENSITIVE Sensitive     CIPROFLOXACIN <=0.25 SENSITIVE Sensitive     GENTAMICIN <=1 SENSITIVE Sensitive     IMIPENEM 2 SENSITIVE Sensitive     PIP/TAZO 8 SENSITIVE Sensitive     TOBRAMYCIN <=1 SENSITIVE Sensitive     * MODERATE PSEUDOMONAS AERUGINOSA  MRSA PCR Screening     Status: None   Collection Time: 04/03/15 11:20 AM  Result Value Ref Range Status   MRSA by PCR NEGATIVE NEGATIVE Final    Comment:        The GeneXpert MRSA Assay (FDA approved for NASAL  specimens only), is one component of a comprehensive MRSA colonization surveillance program. It is not intended to diagnose MRSA infection nor to guide or monitor treatment for MRSA infections.     Assessment: 69 y/oF from [facility] with PMH of bipolar disorder, HTN, IBS, hepatomegaly, peripheral neuropathy who presents to [facility] ED with confusion, fever, and cough. CXR shows mild right lower lobe atelectasis. Pharmacy consulted to dose levofloxacin for pseudomonas in sputum cx.   7/29 >> Zosyn >> 8/3 7/29 >> Vancomycin >>  8/2 8/1>> Levaquin >>   7/29 blood x 2: ngtd 7/29 urine: multiple species, suggest recollection (F) 7/29 sputum: moderate pseudomonas aeruginosa (pan-sens: cefepime, ceftaz, cipro, gent, imi, P/T, tob) 7/29 S. pneumo UAg: neg 7/29 Legionella UAg: neg  Renal: SCr WNL WBC WNL No fevers  Goal of Therapy:  Appropriate antibiotic dosing for renal function and indication Eradication of infection  Plan:  Day #4 levofloxacin (Day #7 effective abx therapy)  Continue levofloxacin  IV q24h as ordered  No adjustment needed based on labs  Change to PO when appropriate  Juliette Alcide, PharmD, BCPS.   Pager: [phone] 04/06/2015 10:23 AM

## 2015-04-06 NOTE — Progress Notes (Signed)
Nutrition Follow-up  DOCUMENTATION CODES:   Obesity unspecified  INTERVENTION:  - Continue Ensure Enlive BID - RD will continue to monitor for needs  NUTRITION DIAGNOSIS:   Inadequate oral intake related to lethargy/confusion as evidenced by meal completion < 50%. -improving   GOAL:   Patient will meet greater than or equal to 90% of their needs -variably met  MONITOR:   PO intake, Supplement acceptance, Labs, Weight trends, Skin, I & O's  ASSESSMENT:   69 y.o. 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 The patient is a bit confused, and cannot provide a lot of details about her HPI, although she does endorse the recent onset of a productive cough, and is observed to be coughing up yellow/green sputum.  Pt sleeping at time of visit and no family present in the room. Notes indicate pt with moderate dementia and that she has been confused and disoriented.   SLP was unable to see pt yesterday AM as pt was out of the room to xray.  Per chart review, pt has been eating 50-75% mainly since initial RD assessment 7/31. Unsure of supplement intakes at this time. Likely variably meeting needs.  Medications reviewed. Labs reviewed; Cl: 95 mmol/L.  Diet Order:  DIET DYS 2 Room service appropriate?: Yes; Fluid consistency:: Thin  Skin:  Reviewed, no issues  Last BM:  8/3  Height:   Ht Readings from Last 1 Encounters:  04/03/15 5' 4"  (1.626 m)    Weight:   Wt Readings from Last 1 Encounters:  04/03/15 194 lb 0.1 oz (88 kg)    Ideal Body Weight:  56.8 kg  BMI:  Body mass index is 33.28 kg/(m^2).  Estimated Nutritional Needs:   Kcal:  1500-1700  Protein:  65-75g  Fluid:  1.6L/day  EDUCATION NEEDS:   No education needs identified at this time     Jarome Matin, RD, LDN Inpatient Clinical  Dietitian Pager # [phone] After hours/weekend pager # [phone]

## 2015-04-06 NOTE — Progress Notes (Addendum)
PROGRESS NOTE    DAMON BAISCH ZOX:[id number] DOB: 1945-09-20 DOA: 03/31/2015 PCP: [name], MD  HPI/Brief narrative 69 y.o. 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 Patient was hospitalized for possible HCAP. On 8/1, patient noted to have respiratory distress and she was transferred to stepdown unit for close monitoring and management. CCM was consulted.   Assessment/Plan:  Principal Problem:  HCAP (healthcare-associated pneumonia) LLL Vs Aspiration PNA - Pseudomonas Aeruginosa - Patient is a SNF resident initially treated with vancomycin/Zosyn. - Blood cultures 2: Negative to date.  -Sputum culture positive for Pseudomonas, organism susceptible to fluoroquinolones - On 8/1, nurses reported patient's worsening dyspnea. Chest x-ray showed increasing bibasilar streaky opacity consistent with atelectasis or infiltrates. Due to concern for decline, patient was transferred to stepdown unit and CCM was consulted. Levofloxacin IV was added for atypical coverage. -Patient having MAXIMUM TEMPERATURE of 99.2 overnight -Remains hemodynamically stable, currently has an O2 sat of 98% -Repeat chest x-ray on 04/05/2015 showing partial clearing of subsegmental infiltrates -Stopped IV antibiotic therapy and started on Augmentin on 8//12/2014   Active Problems:  Sepsis secondary to healthcare associated versus aspiration pneumonia -She remains stable. Changed antimicrobial therapy to Augmentin on 04/06/2015 -Blood cultures showing no growth to date however sputum cultures grew pansensitive susceptible Pseudomonas    Bipolar disorder - Continue Depakote,  Seroquel (dose reduced) and Cymbalta (dose reduced). -Ammonia level normal. Marginally reduced Seroquel and Cymbalta doses. Monitor closely.   Essential hypertension - Continue clonidine, metoprolol and Cozaar. - Controlled   Moderate dementia - Continue Aricept. -Patient remains confused and disoriented  Acute encephalopathy - Most likely secondary to acute illness complicating underlying dementia, prior neuro deficits and bipolar disorder. No focal deficits seen. - Ammonia levels: 20 - Reduce Seroquel and Cymbalta doses marginally. - CT head without acute findings. No acute infarct or hemorrhage. Findings of an careful malacia in right frontal parietal region-area of prior hemorrhage.  Dysphagia - Speech therapy input appreciated. Started on dysphagia 2 diet and thin liquids.  Anemia - Stable  Hypokalemia - A.m. labs show potassium of 3.7 after receiving potassium replacement  DVT prophylaxis: - Lovenox Code Status: Full. Family Communication:  Disposition Plan: Anticipate discharge to SNF in the next 24 hours   Consultants:  CCM-signed off 8/2.  Procedures:  Foley catheter 8/1 >  Antibiotics:  IV vancomycin 7/29 > 8/2  IV Zosyn 7/29 >8/4  IV levofloxacin 8/1 >8/4  Subjective: Patient is confused and disoriented, appears weak.   Objective: Filed Vitals:   04/05/15 2058 04/06/15 0512 04/06/15 0852 04/06/15 0854  BP: 127/63 125/54    Pulse: 105 89    Temp: 98.7 F (37.1 C) 97.9 F (36.6 C)    TempSrc: Oral Axillary    Resp: 18 20    Height:      Weight:      SpO2: 94% 98% 96% 96%    Intake/Output Summary (Last 24 hours) at 04/06/15 1300 Last data filed at 04/06/15 1200  Gross per 24 hour  Intake    340 ml  Output    850 ml  Net   -510 ml   Filed Weights   03/31/15 1208 04/03/15 1131  Weight: 99.791 kg (220 lb)  88 kg (194 lb 0.1 oz)     Exam:  General exam: She is awake and alert, confused, disoriented  Respiratory system: She has  bibasilar crackles, normal respiratory effort Cardiovascular system: S1 & S2 heard, RRR. No JVD, murmurs, gallops, clicks or pedal edema. Telemetry: Sinus rhythm. Gastrointestinal system: Abdomen is nondistended, soft and nontender. Normal bowel sounds heard. Central nervous system: Alert and oriented only to self and partly to place. No focal neurological deficits. Extremities: Symmetric 5 x 5 power.   Data Reviewed: Basic Metabolic Panel:  Recent Labs Lab 03/31/15 1220 04/02/15 0510 04/04/15 0350 04/05/15 0335 04/06/15 0515  NA 133* 134* 135 135 135  K 3.8 3.6 3.0* 3.7 4.4  CL 95* 98* 92* 91* 95*  CO2 29 26 34* 33* 32  GLUCOSE 94 87 100* 101* 87  BUN 16 12 11 18 17   CREATININE 0.94 1.01* 0.97 0.98 0.88  CALCIUM 9.2 8.5* 8.7* 9.0 9.4   Liver Function Tests:  Recent Labs Lab 03/31/15 1220  AST 31  ALT 18  ALKPHOS 35*  BILITOT 0.9  PROT 7.2  ALBUMIN 3.5   No results for input(s): LIPASE, AMYLASE in the last 168 hours.  Recent Labs Lab 04/01/15 1540  AMMONIA 20   CBC:  Recent Labs Lab 03/31/15 1220 04/02/15 0510 04/03/15 0525 04/04/15 0350 04/06/15 0515  WBC 5.7 4.8 4.6 4.1 5.2  NEUTROABS 3.4  --   --   --   --   HGB 12.1 10.3* 10.6* 10.6* 11.8*  HCT 36.8 32.1* 33.4* 32.6* 36.2  MCV 86.0 85.4 85.6 86.2 84.6  PLT 247 192 224 253 342   Cardiac Enzymes: No results for input(s): CKTOTAL, CKMB, CKMBINDEX, TROPONINI in the last 168 hours. BNP (last 3 results) No results for input(s): PROBNP in the last 8760 hours. CBG: No results for input(s): GLUCAP in the last 168 hours.  Recent Results (from the past 240 hour(s))  Culture, blood (routine x 2)     Status: None   Collection Time: 03/31/15 12:10 PM  Result Value Ref Range Status   Specimen Description BLOOD RIGHT ANTECUBITAL  Final   Special Requests BOTTLES DRAWN AEROBIC AND ANAEROBIC 5CC  Final   Culture   Final    NO GROWTH 5 DAYS Performed at [facility] [place]    Report Status 04/05/2015  FINAL  Final  Culture, blood (routine x 2)     Status: None   Collection Time: 03/31/15 12:13 PM  Result Value Ref Range Status   Specimen Description BLOOD RIGHT FOREARM  Final   Special Requests BOTTLES DRAWN AEROBIC AND ANAEROBIC  Final   Culture   Final    NO GROWTH 5 DAYS Performed at [facility]    Report Status 04/05/2015 FINAL  Final  Urine culture     Status: None   Collection Time: 03/31/15 12:38 PM  Result Value Ref Range Status   Specimen Description URINE, CLEAN CATCH  Final   Special Requests NONE  Final   Culture   Final    MULTIPLE SPECIES PRESENT, SUGGEST RECOLLECTION Performed at [facility]    Report Status 04/02/2015 FINAL  Final  Culture, sputum-assessment     Status: None   Collection Time: 03/31/15  4:13 PM  Result Value Ref Range Status   Specimen Description Expect. Sput  Final   Special Requests Normal  Final   Sputum evaluation   Final    THIS SPECIMEN IS ACCEPTABLE. RESPIRATORY CULTURE REPORT TO FOLLOW.  Report Status 03/31/2015 FINAL  Final  Culture, respiratory (NON-Expectorated)     Status: None   Collection Time: 03/31/15  5:00 PM  Result Value Ref Range Status   Specimen Description SPUTUM  Final   Special Requests NONE Performed at [facility]   Final   Gram Stain   Final    MODERATE WBC PRESENT,BOTH PMN AND MONONUCLEAR NO SQUAMOUS EPITHELIAL CELLS SEEN FEW GRAM NEGATIVE RODS FEW GRAM NEGATIVE COCCI Performed at [facility]    Culture   Final    MODERATE PSEUDOMONAS AERUGINOSA Performed at [facility]    Report Status 04/04/2015 FINAL  Final   Organism ID, Bacteria PSEUDOMONAS AERUGINOSA  Final      Susceptibility   Pseudomonas aeruginosa - MIC*    CEFEPIME 2 SENSITIVE Sensitive     CEFTAZIDIME 4 SENSITIVE Sensitive     CIPROFLOXACIN <=0.25 SENSITIVE Sensitive     GENTAMICIN <=1 SENSITIVE Sensitive     IMIPENEM 2 SENSITIVE Sensitive     PIP/TAZO 8 SENSITIVE Sensitive      TOBRAMYCIN <=1 SENSITIVE Sensitive     * MODERATE PSEUDOMONAS AERUGINOSA  MRSA PCR Screening     Status: None   Collection Time: 04/03/15 11:20 AM  Result Value Ref Range Status   MRSA by PCR NEGATIVE NEGATIVE Final    Comment:        The GeneXpert MRSA Assay (FDA approved for NASAL specimens only), is one component of a comprehensive MRSA colonization surveillance program. It is not intended to diagnose MRSA infection nor to guide or monitor treatment for MRSA infections.            Studies: Dg Chest 2 View  04/05/2015   CLINICAL DATA:  Pneumonia.  Cough.  EXAM: CHEST  2 VIEW  COMPARISON:  08/01/ 2016, 04/02/2015.  FINDINGS: The mediastinum and hilar structures are normal. Cardiomegaly with normal pulmonary vascularity. Partial clearing of bibasilar subsegmental atelectasis and/or infiltrates. No pleural effusion or pneumothorax. No acute bony abnormality.  IMPRESSION: Partial clearing of bibasilar subsegmental atelectasis and/or infiltrates.   Electronically Signed   By: Maisie Fus  Register   On: 04/05/2015 11:42        Scheduled Meds: . allopurinol  100 mg Oral Daily  . antiseptic oral rinse  7 mL Mouth Rinse BID  . budesonide (PULMICORT) nebulizer solution  0.25 mg Nebulization BID  . cholecalciferol  2,000 Units Oral Daily  . divalproex  250 mg Oral Daily  . divalproex  500 mg Oral QHS  . donepezil  10 mg Oral QHS  . DULoxetine  40 mg Oral Daily  . enoxaparin (LOVENOX) injection  40 mg Subcutaneous Q24H  . ezetimibe  10 mg Oral Daily  . famotidine  20 mg Oral Daily  . feeding supplement (ENSURE ENLIVE)  237 mL Oral BID BM  . fenofibrate  160 mg Oral Daily  . guaiFENesin  1,200 mg Oral BID  . ipratropium-albuterol  3 mL Nebulization TID  . losartan  100 mg Oral Daily  . metoprolol succinate  100 mg Oral Daily  . multivitamin-prenatal  1 tablet Oral Q1200  . QUEtiapine  12.5 mg Oral QHS   Continuous Infusions:    Principal Problem:   HCAP  (healthcare-associated pneumonia) Active Problems:   Bipolar disorder   Essential hypertension   Moderate dementia   Acute encephalopathy   Hypokalemia    Time spent: 35 minutes.    Jeralyn Bennett, MD, FACP, FHM. [facility] Pager [phone]  If 7PM-7AM,  please contact night-coverage [email or web address] Password [email or web address] 04/06/2015, 1:00 PM    LOS: 6 days

## 2015-04-06 NOTE — Progress Notes (Signed)
Clinical Social Work  Per MD, patient might be ready to DC tomorrow. Per PT, patient can return to ALF with [facility] following. CSW spoke with son [name]) who prefers patient to return to ALF when stable.  CSW spoke with LaToya at ALF and explained DC plans. ALF reports they can accept patient back when stable and that ALF provides HH so CSW just needs to place orders on FL2. CSW can fax FL2 to [phone] tomorrow and ALF reports no face-to-face evaluation needed prior to return.  CSW will continue to follow and will assist with transfer once medically stable.  [place], [place] [phone]

## 2015-04-07 LAB — CREATININE, SERUM
Creatinine, Ser: 1.15 mg/dL — ABNORMAL HIGH (ref 0.44–1.00)
GFR calc non Af Amer: 47 mL/min — ABNORMAL LOW (ref >60)
GFR, EST AFRICAN AMERICAN: 55 mL/min — AB (ref >60)

## 2015-04-07 MED ORDER — AMOXICILLIN-POT CLAVULANATE ER 1000-62.5 MG PO TB12: 2.0000 | ORAL_TABLET | Freq: Two times a day (BID) | ORAL | Status: DC

## 2015-04-07 MED ORDER — LOSARTAN POTASSIUM 100 MG PO TABS: 50.0000 mg | ORAL_TABLET | Freq: Every day | ORAL | Status: DC

## 2015-04-07 MED ORDER — METOPROLOL TARTRATE 50 MG PO TABS: 50.0000 mg | ORAL_TABLET | Freq: Two times a day (BID) | ORAL | Status: DC

## 2015-04-07 NOTE — Progress Notes (Addendum)
SATURATION QUALIFICATIONS: (This note is used to comply with regulatory documentation for home oxygen)  Patient Saturations on Room Air at Rest =  88%  Patient Saturations on Room Air while Ambulating =  86%  Patient Saturations on 3 Liters of oxygen while Ambulating = 94%  Please briefly explain why patient needs home oxygen: O2 sats drop below 89% when off O2   Deborah [name], Bernette Redbird, RN

## 2015-04-07 NOTE — Progress Notes (Signed)
Clinical Social Work  CSW faxed DC summary and FL2 to [employer] and spoke with Melissa who is agreeable to accept patient today. Patient to have HH PT at ALF when returning. CSW placed orders on FL2 and ALF will arrange PT needs. Patient requires oxygen at DC and ALF reports that patient already has concentrator in room. CSW faxed HH orders for oxygen and ALF to arrange through [place]. CSW spoke with son [name]) via phone who is agreeable to [facility] for transportation and aware of no guarantee of payment. RN aware of DC plans. CSW prepared DC packet with FL2, DC summary and HH orders for oxygen.  PTAR arranged for 3:30 pm pick up. Request #: [id number].  CSW is signing off but available if needed.  [place], [place] [phone]

## 2015-04-07 NOTE — Care Management Important Message (Signed)
Important Message  Patient Details  Name: Deborah [name] MRN: [id number] Date of Birth: 1946/07/20   Medicare Important Message Given:  Yes-third notification given    Haskell Flirt 04/07/2015, 12:35 PMImportant Message  Patient Details  Name: Deborah [name] MRN: [id number] Date of Birth: [birth year]   Medicare Important Message Given:  Yes-third notification given    Haskell Flirt 04/07/2015, 12:35 PM

## 2015-04-07 NOTE — Progress Notes (Signed)
Physical Therapy Treatment Patient Details Name: Deborah [name] MRN: [id number] DOB: [birth year] Today's Date: 04/07/2015    History of Present Illness 69 y.o. 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   Follow Up Recommendations  Home health PT;Supervision/Assistance - 24 hour     Equipment Recommendations  None recommended by PT    Recommendations for Other Services       Precautions / Restrictions Precautions Precautions: Fall Precaution Comments: incontinence    Mobility  Bed Mobility   Bed Mobility: Rolling Rolling: Mod assist   Supine to sit: Mod assist;HOB elevated     General bed mobility comments: verbal cues and step by step instructions for pt to better self assist, patient initiated return to supine, assist with legs onto bed.  Transfers                 General transfer comment: nt  Ambulation/Gait                 Stairs            Wheelchair Mobility    Modified Rankin (Stroke Patients Only)       Balance Overall balance assessment: Needs assistance Sitting-balance support: Feet supported;Bilateral upper extremity supported Sitting balance-Leahy Scale: Poor                              Cognition Arousal/Alertness: Awake/alert Behavior During Therapy: WFL for tasks assessed/performed Overall Cognitive Status: No family/caregiver present to determine baseline cognitive functioning Area of Impairment: Orientation;Following commands Orientation Level: Disoriented to;Place;Time;Situation     Following Commands: Follows one step commands with increased  time            Exercises      General Comments        Pertinent Vitals/Pain Pain Assessment: No/denies pain    Home Living                      Prior Function            PT Goals (current goals can now be found in the care plan section) Progress towards PT goals: Progressing toward goals    Frequency  Min 2X/week    PT Plan Current plan remains appropriate    Co-evaluation             End of Session   Activity Tolerance: Patient tolerated treatment well Patient left: in bed;with call bell/phone within reach;with bed alarm set     Time: 4098-1191 PT Time Calculation (min) (ACUTE ONLY): 22 min  Charges:  $Therapeutic Activity: 8-22 mins                    G Codes:      Rada Hay 04/07/2015, 1:33 PM

## 2015-04-07 NOTE — Discharge Summary (Signed)
Physician Discharge Summary  [place] Dalporto Nordling ZOX:[id number] DOB: 06-09-1946 DOA: 03/31/2015  PCP: [name], MD  Admit date: 03/31/2015 Discharge date: 04/07/2015  Time spent: 35 minutes  Recommendations for Outpatient Follow-up:  1. Please follow up on blood pressures, she was hypotensive for which dose of antihypertensive agents were decreased 2. Follow up on HCAP, she was discharged on Augmentin with anticipated stop date: Apr 12, 2015 3. Patient discharged back to ALF  Discharge Diagnoses:  Principal Problem:   HCAP (healthcare-associated pneumonia) Active Problems:   Bipolar disorder   Essential hypertension   Moderate dementia   Acute encephalopathy   Hypokalemia   Discharge Condition: Stable/Improved  Diet recommendation: Heart Healthy  Filed Weights   03/31/15 1208 04/03/15 1131  Weight: 99.791 kg (220 lb) 88 kg (194 lb 0.1 oz)    History of present illness:  Deborah [name] is an 69 y.o. 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 The patient is a bit confused, and cannot provide a lot of details about her HPI, although she does endorse the recent onset of a productive cough, and is observed to be coughing up yellow/green sputum when I examine her. The patient denies shortness of breath but has felt more fatigued than usual. She is wheelchair bound at baseline. Spoke with the patient's son who tells me that the nursing home staff told him that his mother had been confused and weak for several days and that they were sending her to the ER for further evaluation. I reviewed the paperwork sent with her, but there is no documentation of her vital signs.  Hospital Course:    HCAP (healthcare-associated pneumonia) LLL Vs Aspiration PNA  - Patient is a ALF resident initially treated with  Vancomycin/Zosyn. - Blood cultures 2: Negative to date.  -Sputum culture positive for Pseudomonas, organism pansusceptible - On 8/1, nurses reported patient's worsening dyspnea. Chest x-ray showed increasing bibasilar streaky opacity consistent with atelectasis or infiltrates. Due to concern for decline, patient was transferred to stepdown unit and CCM was consulted. Levofloxacin IV was added for atypical coverage. -She showed clinical improvement and transferred to floor -Remains hemodynamically stable, currently has an O2 sat of 98% -Repeat chest x-ray on 04/05/2015 showing partial clearing of subsegmental infiltrates -Stopped IV antibiotic therapy and started on Augmentin on 8//12/2014   Sepsis secondary to healthcare associated versus aspiration pneumonia -She remains stable. Changed antimicrobial therapy to Augmentin on 04/06/2015 -Blood cultures showing no growth to date however sputum cultures grew pansensitive susceptible Pseudomonas   Bipolar disorder - Continue Depakote, Seroquel (dose reduced) and Cymbalta (dose reduced). -Ammonia level normal. Marginally reduced Seroquel and Cymbalta doses. Monitor closely.   Essential hypertension - Continue clonidine, metoprolol and Cozaar. - Controlled   Moderate dementia - Continue Aricept. -Patient remains confused and disoriented  Acute encephalopathy - Most likely secondary to acute illness complicating underlying dementia, prior neuro deficits and bipolar disorder. No focal deficits seen. - Ammonia levels: 20 - Reduce Seroquel and Cymbalta doses marginally. - CT head without acute findings. No acute infarct or hemorrhage. Findings of an careful malacia in right frontal parietal region-area of prior hemorrhage.  Dysphagia - Speech therapy input appreciated. Started on dysphagia 2 diet and thin liquids.  Anemia - Stable  Hypokalemia - A.m. labs show potassium of 3.7 after receiving potassium replacement    Discharge  Exam: Filed Vitals:   04/07/15 1031  BP: 93/51  Pulse:   Temp:   Resp:     General exam: She is awake and alert, confused, disoriented  Respiratory system: She has bibasilar crackles, normal respiratory effort Cardiovascular system: S1 & S2 heard, RRR. No JVD, murmurs, gallops, clicks or pedal edema. Telemetry: Sinus rhythm. Gastrointestinal system: Abdomen is nondistended, soft and nontender. Normal bowel sounds heard. Central nervous system: Alert and oriented only to self and partly to place. No focal neurological deficits. Extremities: Symmetric 5 x 5 power  Discharge Instructions   Discharge Instructions    Call MD for:  difficulty breathing, headache or visual disturbances    Complete by:  As directed      Call MD for:  extreme fatigue    Complete by:  As directed      Call MD for:  hives    Complete by:  As directed      Call MD for:  persistant dizziness or light-headedness    Complete by:  As directed      Call MD for:  persistant nausea and vomiting    Complete by:  As directed      Call MD for:  redness, tenderness, or signs of infection (pain, swelling, redness, odor or green/yellow discharge around incision site)    Complete by:  As directed      Call MD for:  severe uncontrolled pain    Complete by:  As directed      Call MD for:  temperature >100.4    Complete by:  As directed      Call MD for:    Complete by:  As directed      Diet - low sodium heart healthy    Complete by:  As directed      Increase activity slowly    Complete by:  As directed           Current Discharge Medication List    START taking these medications   Details  amoxicillin-clavulanate (AUGMENTIN XR) 1000-62.5 MG per tablet Take 2 tablets by mouth every 12 (twelve) hours. Qty: 8 tablet, Refills: 0    metoprolol (LOPRESSOR) 50 MG tablet Take 1 tablet (50 mg total) by mouth 2 (two) times daily. Qty: 30 tablet, Refills: 0      CONTINUE these medications which have CHANGED    Details  losartan (COZAAR) 100 MG tablet Take 0.5 tablets (50 mg total) by mouth daily. Qty: 30 tablet, Refills: 0      CONTINUE these medications which have NOT CHANGED   Details  acetaminophen (TYLENOL) 500 MG tablet Take 500 mg by mouth every 8 (eight) hours as needed (pain).    albuterol (VENTOLIN HFA) 108 (90 BASE) MCG/ACT inhaler Inhale 1 puff into the lungs every 6 (six) hours as needed for wheezing or shortness of breath (nocturnal coughing).    allopurinol (ZYLOPRIM) 100 MG tablet Take 100 mg by mouth daily.    azelastine (ASTELIN) 137 MCG/SPRAY nasal spray Place 1 spray into both nostrils 2 (two) times daily as needed for rhinitis. Use in each nostril as directed    Cholecalciferol (VITAMIN D) 2000 UNITS tablet Take 2,000 Units by mouth daily.    cloNIDine (CATAPRES) 0.1 MG tablet Take 0.1 mg by mouth daily as needed (SBP >180).     divalproex (DEPAKOTE ER) 250 MG 24 hr tablet Take 250-500 mg by mouth 2 (two) times daily. Pt takes one in the morning and two at  night.    donepezil (ARICEPT) 10 MG tablet Take 10 mg by mouth at bedtime.    DULoxetine (CYMBALTA) 60 MG capsule Take 60 mg by mouth daily.    ezetimibe (ZETIA) 10 MG tablet Take 10 mg by mouth daily.    famotidine (PEPCID) 20 MG tablet Take 20 mg by mouth daily.    fenofibrate 160 MG tablet Take 160 mg by mouth daily.    fluticasone (FLOVENT HFA) 44 MCG/ACT inhaler Inhale 1 puff into the lungs 2 (two) times daily. Rinse mouth after use    ipratropium-albuterol (DUONEB) 0.5-2.5 (3) MG/3ML SOLN Take 3 mLs by nebulization every 6 (six) hours. For shortness of breath    polyethylene glycol (MIRALAX / GLYCOLAX) packet Take 17 g by mouth daily as needed for mild constipation or moderate constipation.     Prenatal Vit-Fe Fumarate-FA (MULTIVITAMIN-PRENATAL) 27-0.8 MG TABS tablet Take 1 tablet by mouth daily at 12 noon. For anemia    QUEtiapine (SEROQUEL) 25 MG tablet Take 25 mg by mouth at bedtime.      STOP taking  these medications     LORazepam (ATIVAN) 0.5 MG tablet      metoprolol succinate (TOPROL-XL) 100 MG 24 hr tablet        Allergies  Allergen Reactions  . Bee Venom Anaphylaxis  . Dicyclomine Hcl Anaphylaxis  . Omeprazole Other (See Comments)     GI upset.  . Other Other (See Comments)    Reaction to reductase inhibitors per MAR  . Statins Other (See Comments)    myalgias   Follow-up Information    Follow up with METHENEY,CATHERINE, MD In 1 week.   Specialty:  Family Medicine   Contact information:   [address] [place] [address] [phone]        The results of significant diagnostics from this hospitalization (including imaging, microbiology, ancillary and laboratory) are listed below for reference.    Significant Diagnostic Studies: Dg Chest 2 View  04/05/2015   CLINICAL DATA:  Pneumonia.  Cough.  EXAM: CHEST  2 VIEW  COMPARISON:  08/01/ 2016, 04/02/2015.  FINDINGS: The mediastinum and hilar structures are normal. Cardiomegaly with normal pulmonary vascularity. Partial clearing of bibasilar subsegmental atelectasis and/or infiltrates. No pleural effusion or pneumothorax. No acute bony abnormality.  IMPRESSION: Partial clearing of bibasilar subsegmental atelectasis and/or infiltrates.   Electronically Signed   By: Maisie Fus  Register   On: 04/05/2015 11:42   Dg Chest 2 View  04/02/2015   CLINICAL DATA:  Pneumonia.  EXAM: CHEST  2 VIEW  COMPARISON:  03/31/2015  FINDINGS: Consolidation noted in the left lower lobe compatible with pneumonia. All no confluent opacity on the right. Heart is normal size. No effusions or acute bony abnormality.  IMPRESSION: Left lower lobe consolidation compatible with pneumonia. This is worsened since prior study.   Electronically Signed   By: Charlett Nose M.D.   On: 04/02/2015 09:59   Dg Chest 2 View  03/31/2015   CLINICAL DATA:  Sepsis  EXAM: CHEST  2 VIEW  COMPARISON:  05/14/2014  FINDINGS: Heart size is mildly enlarged.   Negative for heart failure.  Mild right lower lobe atelectasis. No definite pneumonia. Negative for effusion or mass.  IMPRESSION: Mild right lower lobe atelectasis.   Electronically Signed   By: Marlan Palau M.D.   On: 03/31/2015 13:52   Ct Head Wo Contrast  04/03/2015   CLINICAL DATA:  Altered mental status  EXAM: CT HEAD WITHOUT CONTRAST  TECHNIQUE: Contiguous axial images were obtained from the base of the skull through the vertex without intravenous contrast.  COMPARISON:  May 24, 2014  FINDINGS: Mild diffuse atrophy is stable. There is encephalomalacia in the right frontal -parietal junction immediately adjacent to the falx at the site of a prior hemorrhage. There is currently no hemorrhage on this study. There is no mass effect, extra-axial fluid collection, or midline shift. There is mild small vessel disease in the centra semiovale bilaterally. There is evidence of a prior small infarct in the anterior right parietal lobe at the gray-white junction, stable. No acute infarct is evident. Maxillary antrum with mild expansion medially. There is mucosal thickening in the left maxillary antrum.  IMPRESSION: Evidence of encephalomalacia at the site of a prior hemorrhage just to the right of the falx at the right frontal -parietal junction. Small infarct anterior right parietal lobe. Atrophy with mild small vessel disease. No acute infarct. No acute hemorrhage. Probable mucocele in the right maxillary antrum given medial expansion of the maxillary sinus wall.   Electronically Signed   By: Bretta Bang III M.D.   On: 04/03/2015 11:20   Dg Chest Port 1 View  04/03/2015   CLINICAL DATA:  69 year old female with pneumonia, sepsis. Initial encounter.  EXAM: PORTABLE CHEST - 1 VIEW  COMPARISON:  04/02/2015 and earlier.  FINDINGS: Portable AP semi upright view at 0948 hrs. Stable lung volumes. Stable mediastinal contours. Azygos fissure re- identified. Continued streaky opacity at the left lung base, and  increased streaky opacity at the right lung base since yesterday. No pneumothorax, pleural effusion or pulmonary edema identified.  IMPRESSION: Increased bibasilar streaky opacity since yesterday could relate collect atelectasis or infection. No pleural effusion identified.   Electronically Signed   By: Odessa Fleming M.D.   On: 04/03/2015 11:10    Microbiology: Recent Results (from the past 240 hour(s))  Culture, blood (routine x 2)     Status: None   Collection Time: 03/31/15 12:10 PM  Result Value Ref Range Status   Specimen Description BLOOD RIGHT ANTECUBITAL  Final   Special Requests BOTTLES DRAWN AEROBIC AND ANAEROBIC 5CC  Final   Culture   Final    NO GROWTH 5 DAYS Performed at [facility]    Report Status 04/05/2015 FINAL  Final  Culture, blood (routine x 2)     Status: None   Collection Time: 03/31/15 12:13 PM  Result Value Ref Range Status   Specimen Description BLOOD RIGHT FOREARM  Final   Special Requests BOTTLES DRAWN AEROBIC AND ANAEROBIC  Final   Culture   Final    NO GROWTH 5 DAYS Performed at [facility]    Report Status 04/05/2015 FINAL  Final  Urine culture     Status: None   Collection Time: 03/31/15 12:38 PM  Result Value Ref Range Status   Specimen Description URINE, CLEAN CATCH  Final   Special Requests NONE  Final   Culture   Final    MULTIPLE SPECIES PRESENT, SUGGEST RECOLLECTION Performed at [facility]    Report Status 04/02/2015 FINAL  Final  Culture, sputum-assessment     Status: None   Collection Time: 03/31/15  4:13 PM  Result Value Ref Range Status   Specimen Description Expect. Sput  Final   Special Requests Normal  Final   Sputum evaluation   Final    THIS SPECIMEN IS ACCEPTABLE. RESPIRATORY CULTURE REPORT TO FOLLOW.   Report Status 03/31/2015 FINAL  Final  Culture, respiratory (NON-Expectorated)     Status: None   Collection Time: 03/31/15  5:00 PM  Result Value Ref Range Status   Specimen Description SPUTUM  Final    Special Requests NONE Performed at [facility]   Final   Gram Stain   Final    MODERATE WBC PRESENT,BOTH PMN AND MONONUCLEAR NO SQUAMOUS EPITHELIAL CELLS SEEN FEW GRAM NEGATIVE RODS FEW GRAM NEGATIVE COCCI Performed at [facility]    Culture   Final    MODERATE PSEUDOMONAS AERUGINOSA Performed at [facility]    Report Status 04/04/2015 FINAL  Final   Organism ID, Bacteria PSEUDOMONAS AERUGINOSA  Final      Susceptibility   Pseudomonas aeruginosa - MIC*    CEFEPIME 2 SENSITIVE Sensitive     CEFTAZIDIME 4 SENSITIVE Sensitive     CIPROFLOXACIN <=0.25 SENSITIVE Sensitive     GENTAMICIN <=1 SENSITIVE Sensitive     IMIPENEM 2 SENSITIVE Sensitive     PIP/TAZO 8 SENSITIVE Sensitive     TOBRAMYCIN <=1 SENSITIVE Sensitive     * MODERATE PSEUDOMONAS AERUGINOSA  MRSA PCR Screening     Status: None   Collection Time: 04/03/15 11:20 AM  Result Value Ref Range Status   MRSA by PCR NEGATIVE NEGATIVE Final    Comment:        The GeneXpert MRSA Assay (FDA approved for NASAL specimens only), is one component of a comprehensive MRSA colonization surveillance program. It is not intended to diagnose MRSA infection nor to guide or monitor treatment for MRSA infections.      Labs: Basic Metabolic Panel:  Recent Labs Lab 03/31/15 1220 04/02/15 0510 04/04/15 0350 04/05/15 0335 04/06/15 0515 04/07/15 0519  NA 133* 134* 135 135 135  --   K 3.8 3.6 3.0* 3.7 4.4  --   CL 95* 98* 92* 91* 95*  --   CO2 29 26 34* 33* 32  --   GLUCOSE 94 87 100* 101* 87  --   BUN 16 12 11 18 17   --   CREATININE 0.94 1.01* 0.97 0.98 0.88 1.15*  CALCIUM 9.2 8.5* 8.7* 9.0 9.4  --    Liver Function Tests:  Recent Labs Lab 03/31/15 1220  AST 31  ALT 18  ALKPHOS 35*  BILITOT 0.9  PROT 7.2  ALBUMIN 3.5   No results for input(s): LIPASE, AMYLASE in the last 168 hours.  Recent Labs Lab 04/01/15 1540  AMMONIA 20   CBC:  Recent Labs Lab 03/31/15 1220  04/02/15 0510 04/03/15 0525 04/04/15 0350 04/06/15 0515  WBC 5.7 4.8 4.6 4.1 5.2  NEUTROABS 3.4  --   --   --   --   HGB 12.1 10.3* 10.6* 10.6* 11.8*  HCT 36.8 32.1* 33.4* 32.6* 36.2  MCV 86.0 85.4 85.6 86.2 84.6  PLT 247 192 224 253 342   Cardiac Enzymes: No results for input(s): CKTOTAL, CKMB, CKMBINDEX, TROPONINI in the last 168 hours. BNP: BNP (last 3 results) No results for input(s): BNP in the last 8760 hours.  ProBNP (last 3 results) No results for input(s): PROBNP in the last 8760 hours.  CBG: No results for input(s): GLUCAP in the last 168 hours.     SignedJeralyn Bennett  [facility] 04/07/2015, 11:02 AM

## 2016-01-31 ENCOUNTER — Encounter (HOSPITAL_COMMUNITY): Payer: Self-pay | Admitting: Emergency Medicine

## 2016-01-31 ENCOUNTER — Emergency Department (HOSPITAL_COMMUNITY): Payer: Medicare Other

## 2016-01-31 ENCOUNTER — Emergency Department (HOSPITAL_COMMUNITY)
Admission: EM | Admit: 2016-01-31 | Discharge: 2016-02-01 | Disposition: A | Discharge status: Home / Self Care | Payer: Medicare Other | Attending: Emergency Medicine | Admitting: Emergency Medicine

## 2016-01-31 DIAGNOSIS — F0390 Unspecified dementia without behavioral disturbance: Secondary | ICD-10-CM | POA: Diagnosis not present

## 2016-01-31 DIAGNOSIS — R410 Disorientation, unspecified: Secondary | ICD-10-CM | POA: Diagnosis not present

## 2016-01-31 DIAGNOSIS — Z79899 Other long term (current) drug therapy: Secondary | ICD-10-CM | POA: Insufficient documentation

## 2016-01-31 DIAGNOSIS — F319 Bipolar disorder, unspecified: Secondary | ICD-10-CM | POA: Diagnosis not present

## 2016-01-31 DIAGNOSIS — J449 Chronic obstructive pulmonary disease, unspecified: Secondary | ICD-10-CM | POA: Insufficient documentation

## 2016-01-31 DIAGNOSIS — I10 Essential (primary) hypertension: Secondary | ICD-10-CM | POA: Insufficient documentation

## 2016-01-31 DIAGNOSIS — R4182 Altered mental status, unspecified: Secondary | ICD-10-CM | POA: Diagnosis present

## 2016-01-31 LAB — URINALYSIS, ROUTINE W REFLEX MICROSCOPIC
Glucose, UA: NEGATIVE mg/dL
Hgb urine dipstick: NEGATIVE
Ketones, ur: 15 mg/dL — AB
Leukocytes, UA: NEGATIVE
NITRITE: NEGATIVE
PROTEIN: NEGATIVE mg/dL
SPECIFIC GRAVITY, URINE: 1.031 — AB (ref 1.005–1.030)
pH: 5.5 (ref 5.0–8.0)

## 2016-01-31 LAB — CBC
HCT: 43.7 % (ref 36.0–46.0)
HEMOGLOBIN: 14.2 g/dL (ref 12.0–15.0)
MCH: 28.9 pg (ref 26.0–34.0)
MCHC: 32.5 g/dL (ref 30.0–36.0)
MCV: 88.8 fL (ref 78.0–100.0)
PLATELETS: 408 10*3/uL — AB (ref 150–400)
RBC: 4.92 MIL/uL (ref 3.87–5.11)
RDW: 14.4 % (ref 11.5–15.5)
WBC: 9.3 10*3/uL (ref 4.0–10.5)

## 2016-01-31 LAB — BASIC METABOLIC PANEL
ANION GAP: 9 (ref 5–15)
BUN: 12 mg/dL (ref 6–20)
CALCIUM: 9 mg/dL (ref 8.9–10.3)
CO2: 26 mmol/L (ref 22–32)
CREATININE: 0.76 mg/dL (ref 0.44–1.00)
Chloride: 101 mmol/L (ref 101–111)
GFR calc non Af Amer: >60 mL/min (ref >60)
Glucose, Bld: 101 mg/dL — ABNORMAL HIGH (ref 65–99)
Potassium: 4.2 mmol/L (ref 3.5–5.1)
Sodium: 136 mmol/L (ref 135–145)

## 2016-01-31 LAB — CBG MONITORING, ED: Glucose-Capillary: 98 mg/dL (ref 65–99)

## 2016-01-31 MED ORDER — CLONIDINE HCL 0.1 MG PO TABS
0.1000 mg | ORAL_TABLET | Freq: Once | ORAL | Status: AC
  Administered 2016-01-31: 0.1 mg via ORAL
  Filled 2016-01-31: qty 1

## 2016-01-31 MED ORDER — METOPROLOL TARTRATE 25 MG PO TABS
50.0000 mg | ORAL_TABLET | Freq: Once | ORAL | Status: AC
  Administered 2016-01-31: 50 mg via ORAL
  Filled 2016-01-31: qty 2

## 2016-01-31 NOTE — ED Notes (Signed)
[facility]

## 2016-01-31 NOTE — ED Notes (Signed)
[facility] today after staff states that she has dementia but has been more lethargic since Sunday. Alert. Normally on 2L [place].

## 2016-01-31 NOTE — ED Notes (Signed)
[facility] contacted for transport 

## 2016-01-31 NOTE — ED Provider Notes (Signed)
CSN: [id number]     Arrival date & time 01/31/16  1708 History   First MD Initiated Contact with Patient 01/31/16 1735     Chief Complaint  Patient presents with  . Altered Mental Status     (Consider location/radiation/quality/duration/timing/severity/associated sxs/prior Treatment) HPI Patient is sent from [facility] for increased lethargy for the past 3-4 days. Patient does have dementia. At baseline the reports she is normally more alert and is on 2 L of nasal cannula. There is no report of fevers, vomiting, diarrhea. Patient is a poor historian. She is denying any localizing pain. Her speech consistent with dementia with comments unrelated to the situation. She is not endorsing any type of pain or distress. She is very vague with her comments and smiles intermittently. Past Medical History  Diagnosis Date  . Bipolar disorder (HCC)     [name], NP  . Depression   . Hyperlipidemia   . Hypertension   . Hiatal hernia   . Esophageal reflux   . Other specified disorders of liver   . Headache(784.0)   . Hepatomegaly   . Other chronic nonalcoholic liver disease   . Cardiomegaly   . Irritable bowel syndrome   . Diverticulosis of colon (without mention of hemorrhage)   . COPD (chronic obstructive pulmonary disease) (HCC)   . Nausea   . Family history of colon cancer   . Gout flare     Left toe  . Dementia   . Chronic headache   . Peripheral neuropathy (HCC)   . PLMD (periodic limb movement disorder)   . Abscess of face 05/06/2014   Past Surgical History  Procedure Laterality Date  . Cholecystectomy    . Abdominal hysterectomy      partial  . Ovarian cyst removal    . Removal of cyst on chest wall    . Appendectomy     Family History  Problem Relation Age of Onset  . Emphysema Mother   . Heart disease Father   . Mental illness Father     Bipolar disorder  . Colon cancer Maternal Grandfather   . Alzheimer's disease Brother   . Bipolar disorder Brother    Social History   Substance Use Topics  . Smoking status: Never Smoker   . Smokeless tobacco: Never Used  . Alcohol Use: No   OB History    No data available     Review of SystemsCannot obtain review of systems level V caveat.  Allergies  Bee venom; Dicyclomine hcl; Omeprazole; Other; and Statins  Home Medications   Prior to Admission medications   Medication Sig Start Date End Date Taking? Authorizing Provider  allopurinol (ZYLOPRIM) 100 MG tablet Take 100 mg by mouth daily.   Yes Historical Provider, MD  atropine 1 % ophthalmic solution Place 4 drops into both eyes every 2 (two) hours as needed (terminal secretions).   Yes Historical Provider, MD  Cholecalciferol (VITAMIN D) 2000 UNITS tablet Take 2,000 Units by mouth daily.   Yes Historical Provider, MD  divalproex (DEPAKOTE ER) 250 MG 24 hr tablet Take 250-500 mg by mouth 2 (two) times daily. Pt takes one in the morning and two at night.   Yes Historical Provider, MD  DULoxetine (CYMBALTA) 60 MG capsule Take 60 mg by mouth daily.   Yes Historical Provider, MD  ENSURE PLUS (ENSURE PLUS) LIQD Take 237 mLs by mouth 2 (two) times daily between meals.   Yes Historical Provider, MD  famotidine (PEPCID) 20 MG tablet Take  20 mg by mouth daily.   Yes Historical Provider, MD  fluticasone (FLOVENT HFA) 44 MCG/ACT inhaler Inhale 1 puff into the lungs 2 (two) times daily. Rinse mouth after use   Yes Historical Provider, MD  guaifenesin (ROBITUSSIN CHEST CONGESTION) 100 MG/5ML syrup Take 200 mg by mouth 3 (three) times daily as needed for cough or congestion.   Yes Historical Provider, MD  ipratropium-albuterol (DUONEB) 0.5-2.5 (3) MG/3ML SOLN Take 3 mLs by nebulization every 6 (six) hours. For shortness of breath 07/05/11  Yes Agapito Games, MD  loperamide (IMODIUM) 2 MG capsule Take 2 mg by mouth as needed for diarrhea or loose stools.   Yes Historical Provider, MD  LORazepam (ATIVAN) 0.5 MG tablet Take 0.5 mg by mouth every 8 (eight) hours as needed for  anxiety.   Yes Historical Provider, MD  losartan (COZAAR) 100 MG tablet Take 0.5 tablets (50 mg total) by mouth daily. 04/07/15  Yes Jeralyn Bennett, MD  metoprolol (LOPRESSOR) 50 MG tablet Take 1 tablet (50 mg total) by mouth 2 (two) times daily. 04/07/15  Yes Jeralyn Bennett, MD  oxyCODONE (OXY IR/ROXICODONE) 5 MG immediate release tablet Take 5 mg by mouth every 4 (four) hours as needed for severe pain.   Yes Historical Provider, MD  Prenatal Vit-Fe Fumarate-FA (MULTIVITAMIN-PRENATAL) 27-0.8 MG TABS tablet Take 1 tablet by mouth daily at 12 noon. For anemia   Yes Historical Provider, MD  promethazine (PHENERGAN) 25 MG suppository Place 25 mg rectally every 6 (six) hours as needed for nausea or vomiting.   Yes Historical Provider, MD  promethazine (PHENERGAN) 25 MG tablet Take 25 mg by mouth every 4 (four) hours as needed for nausea or vomiting.   Yes Historical Provider, MD  QUEtiapine (SEROQUEL) 25 MG tablet Take 25 mg by mouth at bedtime.   Yes Historical Provider, MD  acetaminophen (TYLENOL) 500 MG tablet Take 500 mg by mouth every 8 (eight) hours as needed (pain).    Historical Provider, MD  albuterol (VENTOLIN HFA) 108 (90 BASE) MCG/ACT inhaler Inhale 1 puff into the lungs every 6 (six) hours as needed for wheezing or shortness of breath (nocturnal coughing).    Historical Provider, MD  amoxicillin-clavulanate (AUGMENTIN XR) 1000-62.5 MG per tablet Take 2 tablets by mouth every 12 (twelve) hours. Patient not taking: Reported on 01/31/2016 04/07/15   Jeralyn Bennett, MD  cloNIDine (CATAPRES) 0.1 MG tablet Take 0.1 mg by mouth daily as needed (SBP >180).     Historical Provider, MD  polyethylene glycol (MIRALAX / GLYCOLAX) packet Take 17 g by mouth daily as needed for mild constipation or moderate constipation.     Historical Provider, MD   BP 164/100 mmHg  Pulse 102  Temp(Src) 98.5 F (36.9 C) (Rectal)  Resp 24  SpO2 95% Physical Exam  Constitutional:  Patient is nontoxic. She has no  respiratory distress. Color is good. They said her bedside, she gazes at me and tries to make responses to questions.  HENT:  Head: Normocephalic and atraumatic.  Nose: Nose normal.  Mouth/Throat: Oropharynx is clear and moist.  Eyes: EOM are normal. Pupils are equal, round, and reactive to light.  Neck: Neck supple.  Cardiovascular: Normal rate, regular rhythm, normal heart sounds and intact distal pulses.   Pulmonary/Chest: Effort normal and breath sounds normal.  Abdominal: Soft. Bowel sounds are normal. She exhibits no distension. There is no tenderness.  Musculoskeletal: Normal range of motion. She exhibits no edema or tenderness.  Patient is not skilled and following commands.  She however will assist in gripping my hands to move forward. She does not seem to have any pain with range of motion using both upper extremities. As she was rolled on her side for examination of her back,  she moves both legs at will uses her arms to grip the bed rails. Patient's backside is examined. There are no areas of erythema, skin breakdown or decubitus. Buttock and peri anal region are normal in appearance.  Neurological:  The patient is awake as I interact with her. She is not oriented to place or time. She is confused by requests to perform neurologic examination but once my hands are placed in her finger she will hold onto them. As I examine her legs and pick them up she also moves them about.  Skin: Skin is warm and dry. No rash noted. No pallor.  Psychiatric:  Patient is calm.    ED Course  Procedures (including critical care time) Labs Review Labs Reviewed  BASIC METABOLIC PANEL - Abnormal; Notable for the following:    Glucose, Bld 101 (*)    All other components within normal limits  CBC - Abnormal; Notable for the following:    Platelets 408 (*)    All other components within normal limits  URINALYSIS, ROUTINE W REFLEX MICROSCOPIC (NOT AT [facility]) - Abnormal; Notable for the following:     APPearance CLOUDY (*)    Specific Gravity, Urine 1.031 (*)    Bilirubin Urine SMALL (*)    Ketones, ur 15 (*)    All other components within normal limits  CBG MONITORING, ED    Imaging Review Ct Head Wo Contrast  01/31/2016  CLINICAL DATA:  Mental status change. Dementia patient with increased lethargy. EXAM: CT HEAD WITHOUT CONTRAST TECHNIQUE: Contiguous axial images were obtained from the base of the skull through the vertex without intravenous contrast. COMPARISON:  Head CT 04/03/2015 FINDINGS: No intracranial hemorrhage, mass effect, or midline shift. Stable atrophy. Unchanged encephalomalacia in the right frontoparietal junction adjacent to the falx, sequela of prior hemorrhage. No hydrocephalus. The basilar cisterns are patent. Mild chronic small vessel ischemia. No evidence of territorial infarct. No intracranial fluid collection. Calvarium is intact. Chronic opacification of right maxillary sinus. Chronic mucosal thickening of left maxillary sinus. Mastoid air cells are well aerated. IMPRESSION: 1.  No acute intracranial abnormality. 2. Stable atrophy, chronic small vessel ischemia, and sequela of remote prior hemorrhage. Electronically Signed   By: Rubye Oaks M.D.   On: 01/31/2016 21:16   Dg Chest Port 1 View  01/31/2016  CLINICAL DATA:  Dementia. EXAM: PORTABLE CHEST 1 VIEW COMPARISON:  April 05, 2015 FINDINGS: There is a probable hiatal hernia. The heart, hila, and mediastinum are unchanged. No pulmonary nodules or masses. No focal infiltrates are identified. No overt edema. IMPRESSION: No active disease. Electronically Signed   By: Gerome Sam III M.D   On: 01/31/2016 18:22   I have personally reviewed and evaluated these images and lab results as part of my medical decision-making.   EKG Interpretation   Date/Time:  Wednesday Jan 31 2016 17:23:16 EDT Ventricular Rate:  102 PR Interval:  127 QRS Duration: 80 QT Interval:  345 QTC Calculation: 449 R Axis:   75 Text  Interpretation:  Sinus tachycardia Borderline repolarization  abnormality Confirmed by Donnald Garre, MD, Lebron Conners [phone]) on 01/31/2016 10:44:04  PM      MDM   Final diagnoses:  Disorientation  Dementia, without behavioral disturbance   Diagnostic evaluation does not identify a specific  cause for the patient's mental status changes reported per [facility]. Patient does have a history of dementia. I reviewed prior EMR. At time of discharge hospitalization [phone], patient was confused and disoriented. At this point, it appears the patient has underlying dementia which may be exacerbated by intercurrent illness however no sign of acute infection is identified. Skin surfaces identified and no evidence of cellulitis. Urinalysis does not show UTI and patient does not have leukocytosis. Chest x-ray does not show infiltrate and patient is not coughing or showing signs of acute pneumonia. Temperature does not reveal fever. CT head does not identify intracranial injury or acute finding. At this time [facility] monitoring and continuation patient's regular medications. Be rechecked by facility physician and return if symptoms progress or new symptoms develop.     Arby BarretteMarcy Refugia Laneve, MD 01/31/16 [phone]

## 2016-01-31 NOTE — Discharge Instructions (Signed)
Confusion At this time, there is not a clear cause for the patient's increased level of confusion. No infection was identified. CT scan of the head does not show evidence of stroke or other intracranial injury at this time. Continue the patient's regularly scheduled medications and have recheck with the facility physician within one to 2 days. Confusion is the inability to think with your usual speed or clarity. Confusion may come on quickly or slowly over time. How quickly the confusion comes on depends on the cause. Confusion can be due to any number of causes. CAUSES   Concussion, head injury, or head trauma.  Seizures.  Stroke.  Fever.  Brain tumor.  Age related decreased brain function (dementia).  Heightened emotional states like rage or terror.  Mental illness in which the person loses the ability to determine what is real and what is not (hallucinations).  Infections such as a urinary tract infection (UTI).  Toxic effects from alcohol, drugs, or prescription medicines.  Dehydration and an imbalance of salts in the body (electrolytes).  Lack of sleep.  Low blood sugar (diabetes).  Low levels of oxygen from conditions such as chronic lung disorders.  Drug interactions or other medicine side effects.  Nutritional deficiencies, especially niacin, thiamine, vitamin C, or vitamin B.  Sudden drop in body temperature (hypothermia).  Change in routine, such as when traveling or hospitalized. SIGNS AND SYMPTOMS  People often describe their thinking as cloudy or unclear when they are confused. Confusion can also include feeling disoriented. That means you are unaware of where or who you are. You may also not know what the date or time is. If confused, you may also have difficulty paying attention, remembering, and making decisions. Some people also act aggressively when they are confused.  DIAGNOSIS  The medical evaluation of confusion may include:  Blood and urine  tests.  X-rays.  Brain and nervous system tests.  Analyzing your brain waves (electroencephalogram or EEG).  Magnetic resonance imaging (MRI) of your head.  Computed tomography (CT) scan of your head.  Mental status tests in which your health care provider may ask many questions. Some of these questions may seem silly or strange, but they are a very important test to help diagnose and treat confusion. TREATMENT  An admission to the hospital may not be needed, but a person with confusion should not be left alone. Stay with a family member or friend until the confusion clears. Avoid alcohol, pain relievers, or sedative drugs until you have fully recovered. Do not drive until directed by your health care provider. HOME CARE INSTRUCTIONS  What family and friends can do:  To find out if someone is confused, ask the person to state his or her name, age, and the date. If the person is unsure or answers incorrectly, he or she is confused.  Always introduce yourself, no matter how well the person knows you.  Often remind the person of his or her location.  Place a calendar and clock near the confused person.  Help the person with his or her medicines. You may want to use a pill box, an alarm as a reminder, or give the person each dose as prescribed.  Talk about current events and plans for the day.  Try to keep the environment calm, quiet, and peaceful.  Make sure the person keeps follow-up visits with his or her health care provider. PREVENTION  Ways to prevent confusion:  Avoid alcohol.  Eat a balanced diet.  Get enough sleep.  Take medicine only as directed by your health care provider.  Do not become isolated. Spend time with other people and make plans for your days.  Keep careful watch on your blood sugar levels if you are diabetic. SEEK IMMEDIATE MEDICAL CARE IF:   You develop severe headaches, repeated vomiting, seizures, blackouts, or slurred speech.  There is  increasing confusion, weakness, numbness, restlessness, or personality changes.  You develop a loss of balance, have marked dizziness, feel uncoordinated, or fall.  You have delusions, hallucinations, or develop severe anxiety.  Your family members think you need to be rechecked.   This information is not intended to replace advice given to you by your health care provider. Make sure you discuss any questions you have with your health care provider.   Document Released: 09/26/2004 Document Revised: 09/09/2014 Document Reviewed: 09/24/2013 Elsevier Interactive Patient Education [employer].

## 2016-01-31 NOTE — ED Notes (Signed)
Bed: WA18 Expected date:  Expected time:  Means of arrival:  Comments: [facility] 

## 2016-01-31 NOTE — ED Notes (Signed)
Pt transported to CT ?

## 2016-08-12 IMAGING — CT CT HEAD W/O CM
2 of 3 series · 16 of 30 positions shown, 18 images · non-contrast
Comparison: May 24, 2014

CLINICAL DATA: Altered mental status

EXAM:
CT HEAD WITHOUT CONTRAST
TECHNIQUE: Contiguous axial images were obtained from the base of the skull
through the vertex without intravenous contrast.

[Series 2: headseq 4.8 h45s · axial · 0.43mm/px · z∈[-112,-6]mm · 8 of 30 slices shown, 10 images (1 of 2)]
[im 4/30  brain]
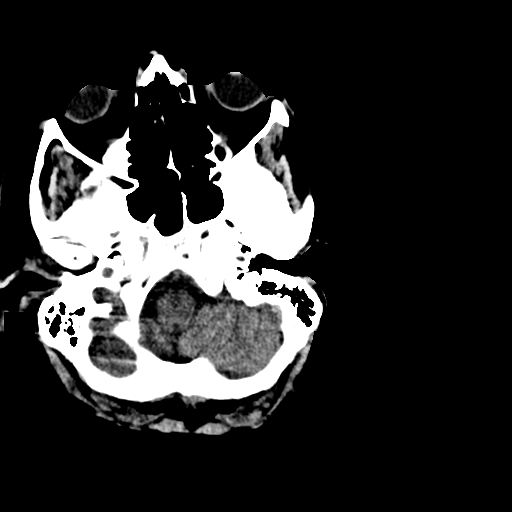
[im 4/30  bone]
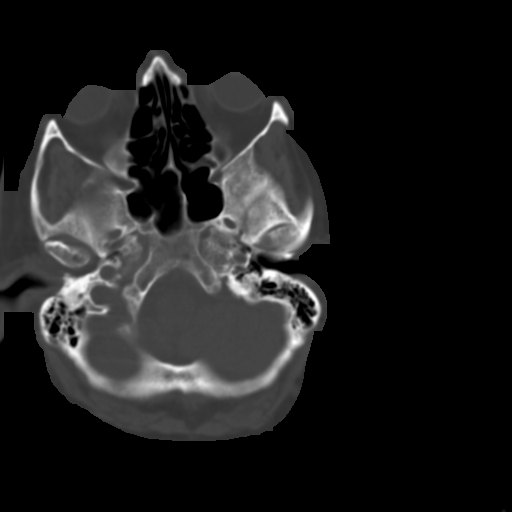
[im 7/30  brain]
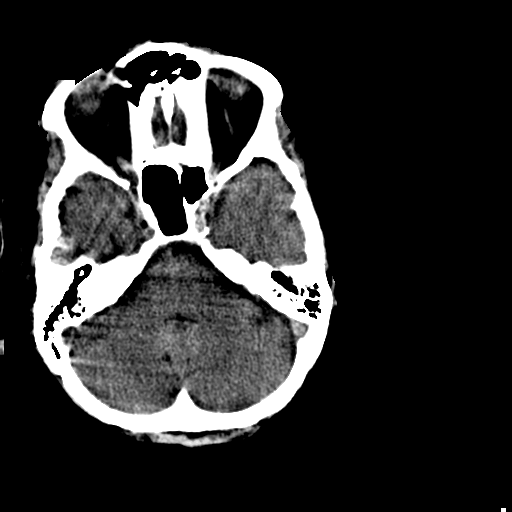
[im 10/30  brain]
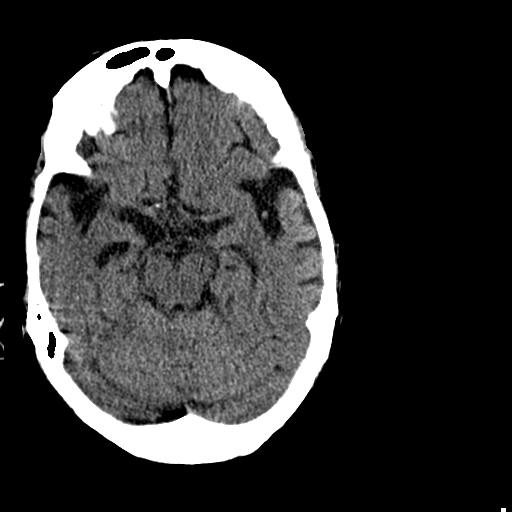
[im 13/30  brain]
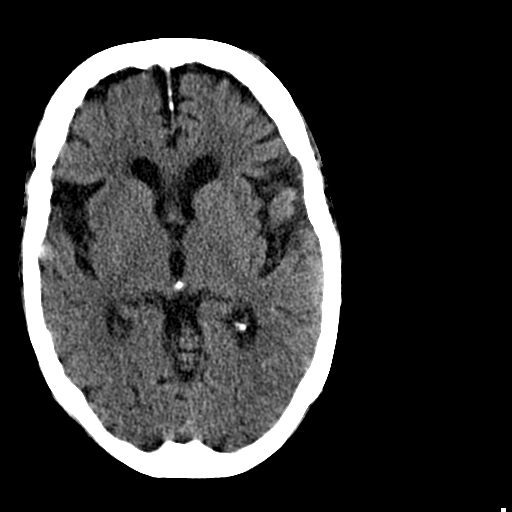
[im 17/30  brain]
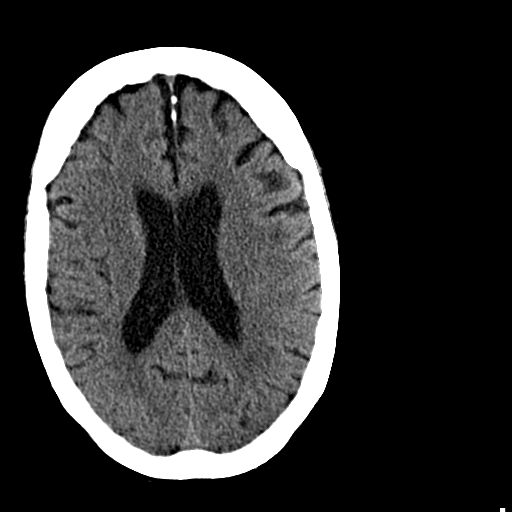
[im 17/30  bone]
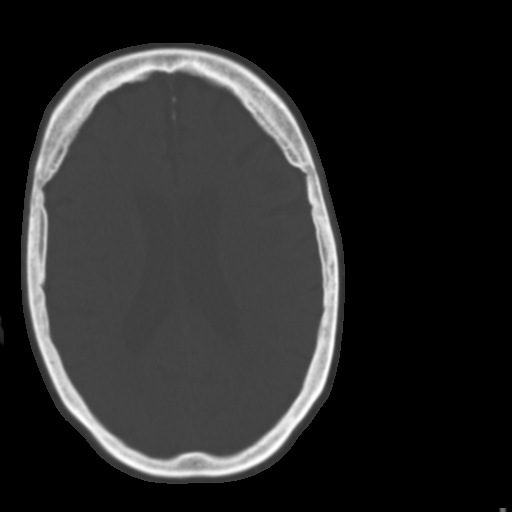
[im 20/30  brain]
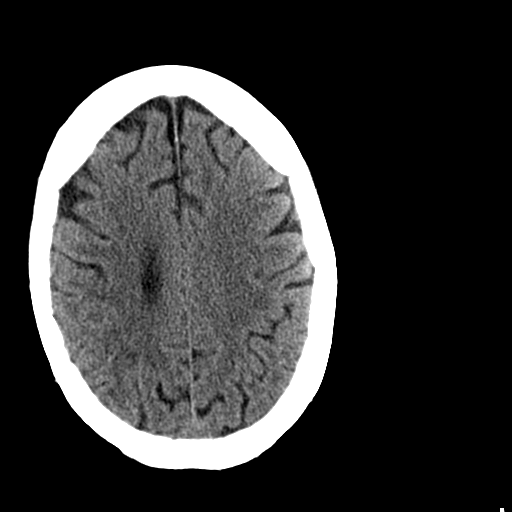
[im 23/30  brain]
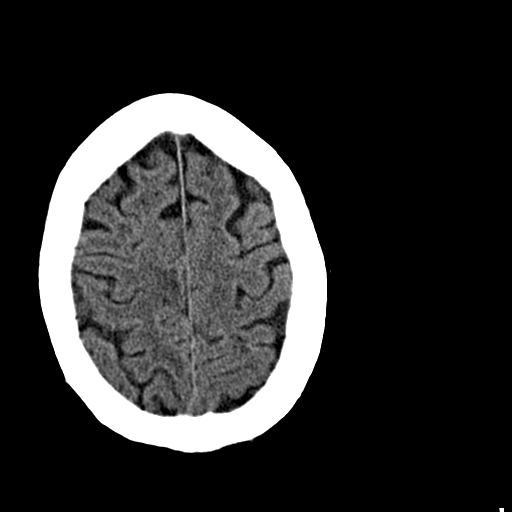
[im 26/30  brain]
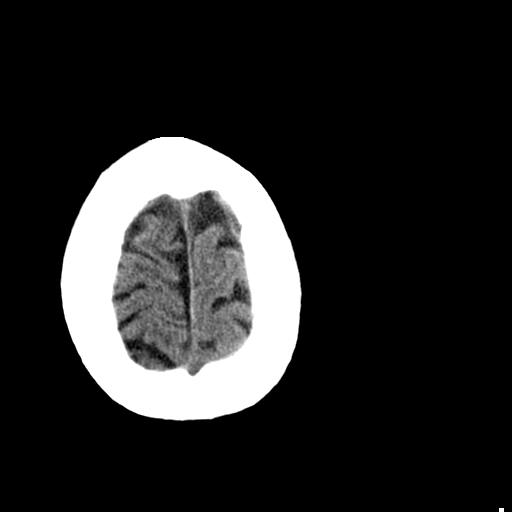

[Series 5: headseq 4.8 h45s · axial · 0.43mm/px · z∈[-115,-8]mm · 8 of 30 slices shown (2 of 2)]
[im 4/30  brain]
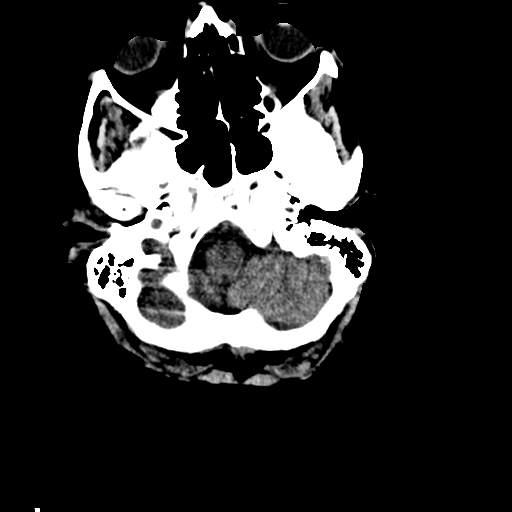
[im 7/30  brain]
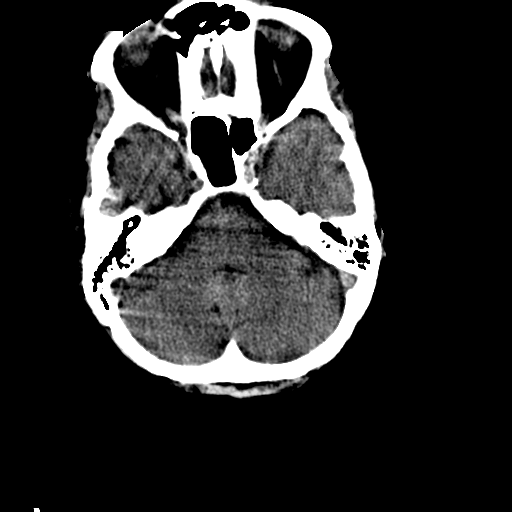
[im 10/30  brain]
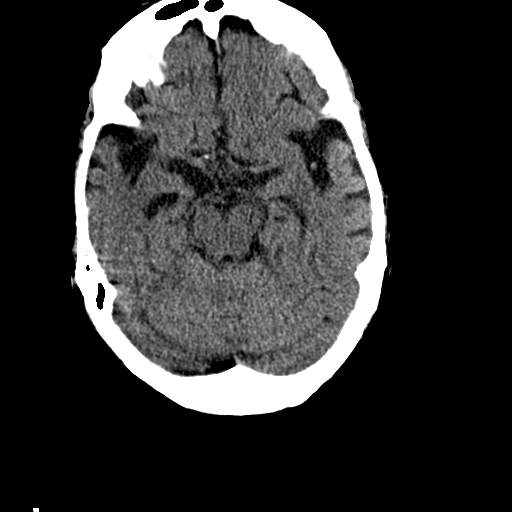
[im 13/30  brain]
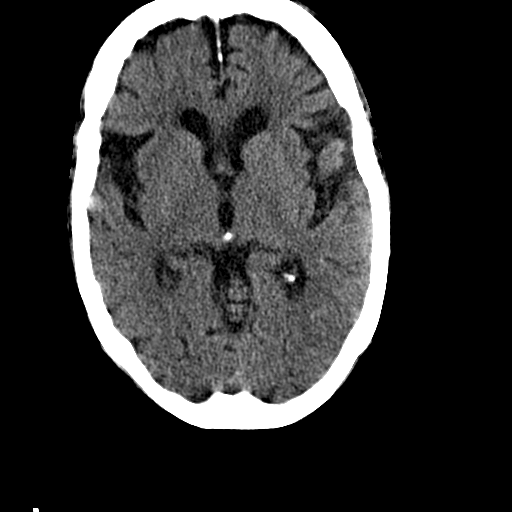
[im 17/30  brain]
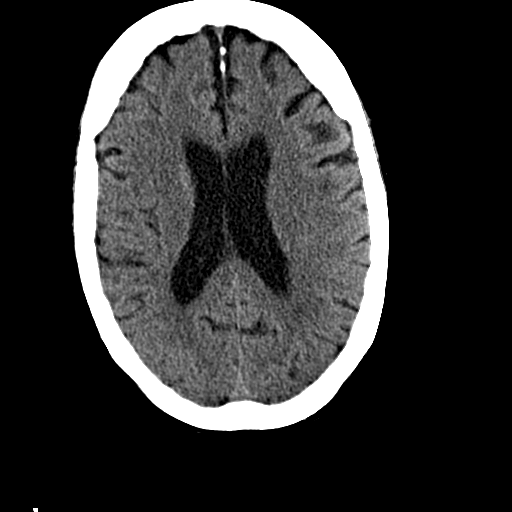
[im 20/30  brain]
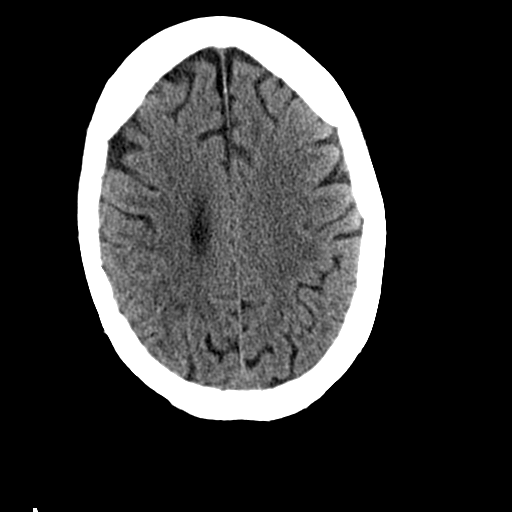
[im 23/30  brain]
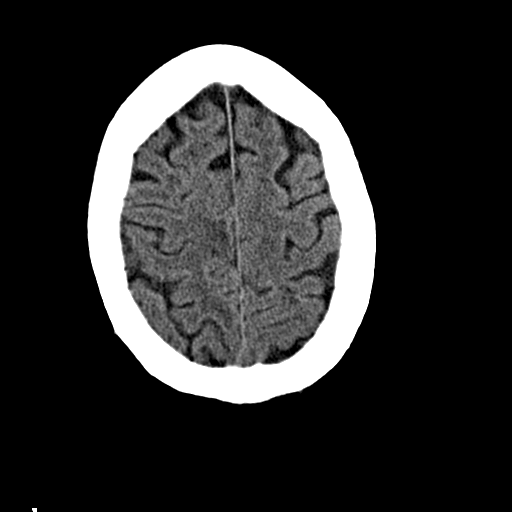
[im 26/30  brain]
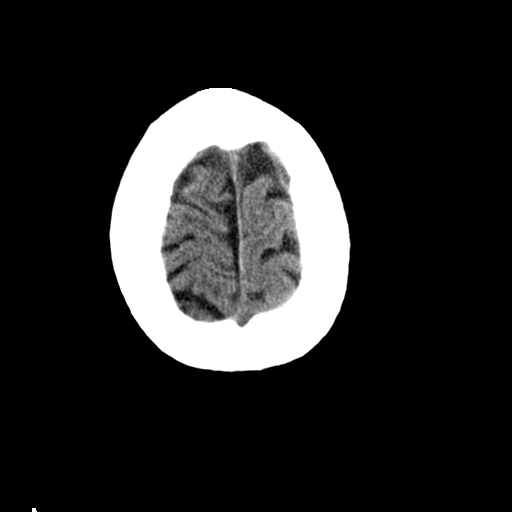

[16 of 30 positions shown; findings below may reference images not displayed]

FINDINGS: Mild diffuse atrophy is stable. There is encephalomalacia in the
right frontal -parietal junction immediately adjacent to the falx at
the site of a prior hemorrhage. There is currently no hemorrhage on
this study. There is no mass effect, extra-axial fluid collection,
or midline shift. There is mild small vessel disease in the centra
semiovale bilaterally. There is evidence of a prior small infarct in
the anterior right parietal lobe at the gray-white junction, stable.
No acute infarct is evident. Maxillary antrum with mild expansion
medially. There is mucosal thickening in the left maxillary antrum.
IMPRESSION: Evidence of encephalomalacia at the site of a prior hemorrhage just
to the right of the falx at the right frontal -parietal junction.
Small infarct anterior right parietal lobe. Atrophy with mild small
vessel disease. No acute infarct. No acute hemorrhage. Probable
mucocele in the right maxillary antrum given medial expansion of the
maxillary sinus wall.

## 2016-12-05 ENCOUNTER — Encounter (HOSPITAL_COMMUNITY): Payer: Self-pay

## 2016-12-05 ENCOUNTER — Emergency Department (HOSPITAL_COMMUNITY): Payer: Medicare Other

## 2016-12-05 ENCOUNTER — Observation Stay (HOSPITAL_COMMUNITY)
Admission: EM | Admit: 2016-12-05 | Discharge: 2016-12-07 | Disposition: A | Discharge status: Skilled Nursing Facility | Payer: Medicare Other | Attending: Internal Medicine | Admitting: Internal Medicine

## 2016-12-05 DIAGNOSIS — E785 Hyperlipidemia, unspecified: Secondary | ICD-10-CM | POA: Diagnosis not present

## 2016-12-05 DIAGNOSIS — Z818 Family history of other mental and behavioral disorders: Secondary | ICD-10-CM | POA: Diagnosis not present

## 2016-12-05 DIAGNOSIS — D62 Acute posthemorrhagic anemia: Secondary | ICD-10-CM | POA: Insufficient documentation

## 2016-12-05 DIAGNOSIS — Z8673 Personal history of transient ischemic attack (TIA), and cerebral infarction without residual deficits: Secondary | ICD-10-CM | POA: Insufficient documentation

## 2016-12-05 DIAGNOSIS — Z79899 Other long term (current) drug therapy: Secondary | ICD-10-CM | POA: Insufficient documentation

## 2016-12-05 DIAGNOSIS — K760 Fatty (change of) liver, not elsewhere classified: Secondary | ICD-10-CM | POA: Insufficient documentation

## 2016-12-05 DIAGNOSIS — Z800 Family history of malignant neoplasm of digestive organs: Secondary | ICD-10-CM | POA: Insufficient documentation

## 2016-12-05 DIAGNOSIS — D509 Iron deficiency anemia, unspecified: Secondary | ICD-10-CM | POA: Diagnosis not present

## 2016-12-05 DIAGNOSIS — Z836 Family history of other diseases of the respiratory system: Secondary | ICD-10-CM | POA: Insufficient documentation

## 2016-12-05 DIAGNOSIS — J449 Chronic obstructive pulmonary disease, unspecified: Secondary | ICD-10-CM | POA: Insufficient documentation

## 2016-12-05 DIAGNOSIS — F0390 Unspecified dementia without behavioral disturbance: Secondary | ICD-10-CM | POA: Diagnosis not present

## 2016-12-05 DIAGNOSIS — E7800 Pure hypercholesterolemia, unspecified: Secondary | ICD-10-CM | POA: Diagnosis not present

## 2016-12-05 DIAGNOSIS — M109 Gout, unspecified: Secondary | ICD-10-CM | POA: Insufficient documentation

## 2016-12-05 DIAGNOSIS — I119 Hypertensive heart disease without heart failure: Secondary | ICD-10-CM | POA: Insufficient documentation

## 2016-12-05 DIAGNOSIS — K219 Gastro-esophageal reflux disease without esophagitis: Secondary | ICD-10-CM | POA: Diagnosis not present

## 2016-12-05 DIAGNOSIS — G9340 Encephalopathy, unspecified: Secondary | ICD-10-CM | POA: Insufficient documentation

## 2016-12-05 DIAGNOSIS — F329 Major depressive disorder, single episode, unspecified: Secondary | ICD-10-CM | POA: Insufficient documentation

## 2016-12-05 DIAGNOSIS — D649 Anemia, unspecified: Secondary | ICD-10-CM | POA: Diagnosis present

## 2016-12-05 DIAGNOSIS — N393 Stress incontinence (female) (male): Secondary | ICD-10-CM | POA: Diagnosis not present

## 2016-12-05 DIAGNOSIS — F03B0 Unspecified dementia, moderate, without behavioral disturbance, psychotic disturbance, mood disturbance, and anxiety: Secondary | ICD-10-CM | POA: Diagnosis present

## 2016-12-05 DIAGNOSIS — I10 Essential (primary) hypertension: Secondary | ICD-10-CM | POA: Diagnosis not present

## 2016-12-05 DIAGNOSIS — G629 Polyneuropathy, unspecified: Secondary | ICD-10-CM | POA: Diagnosis not present

## 2016-12-05 DIAGNOSIS — F319 Bipolar disorder, unspecified: Secondary | ICD-10-CM | POA: Diagnosis not present

## 2016-12-05 LAB — BASIC METABOLIC PANEL
Anion gap: 8 (ref 5–15)
BUN: 16 mg/dL (ref 6–20)
CALCIUM: 8.5 mg/dL — AB (ref 8.9–10.3)
CO2: 25 mmol/L (ref 22–32)
Chloride: 103 mmol/L (ref 101–111)
Creatinine, Ser: 0.77 mg/dL (ref 0.44–1.00)
GFR calc Af Amer: >60 mL/min (ref >60)
GLUCOSE: 95 mg/dL (ref 65–99)
Potassium: 4.6 mmol/L (ref 3.5–5.1)
Sodium: 136 mmol/L (ref 135–145)

## 2016-12-05 LAB — CBC WITH DIFFERENTIAL/PLATELET
BASOS ABS: 0 10*3/uL (ref 0.0–0.1)
BASOS PCT: 1 %
EOS ABS: 0.3 10*3/uL (ref 0.0–0.7)
Eosinophils Relative: 4 %
HEMATOCRIT: 16.4 % — AB (ref 36.0–46.0)
HEMOGLOBIN: 4.5 g/dL — AB (ref 12.0–15.0)
Lymphocytes Relative: 35 %
Lymphs Abs: 2.2 10*3/uL (ref 0.7–4.0)
MCH: 17.1 pg — ABNORMAL LOW (ref 26.0–34.0)
MCHC: 27.4 g/dL — AB (ref 30.0–36.0)
MCV: 62.4 fL — AB (ref 78.0–100.0)
MONOS PCT: 15 %
Monocytes Absolute: 1 10*3/uL (ref 0.1–1.0)
NEUTROS ABS: 2.9 10*3/uL (ref 1.7–7.7)
Neutrophils Relative %: 45 %
Platelets: 442 10*3/uL — ABNORMAL HIGH (ref 150–400)
RBC: 2.63 MIL/uL — ABNORMAL LOW (ref 3.87–5.11)
RDW: 18.1 % — ABNORMAL HIGH (ref 11.5–15.5)
WBC: 6.4 10*3/uL (ref 4.0–10.5)

## 2016-12-05 LAB — PREPARE RBC (CROSSMATCH)

## 2016-12-05 LAB — POC OCCULT BLOOD, ED: FECAL OCCULT BLD: NEGATIVE

## 2016-12-05 LAB — ABO/RH: ABO/RH(D): A POS

## 2016-12-05 LAB — MRSA PCR SCREENING: MRSA BY PCR: NEGATIVE

## 2016-12-05 MED ORDER — LOPERAMIDE HCL 2 MG PO CAPS: 2.0000 mg | ORAL_CAPSULE | ORAL | Status: DC | PRN

## 2016-12-05 MED ORDER — ALBUTEROL SULFATE (2.5 MG/3ML) 0.083% IN NEBU: 2.5000 mg | INHALATION_SOLUTION | Freq: Four times a day (QID) | RESPIRATORY_TRACT | Status: DC | PRN

## 2016-12-05 MED ORDER — ALLOPURINOL 100 MG PO TABS
100.0000 mg | ORAL_TABLET | Freq: Every day | ORAL | Status: DC
  Administered 2016-12-06 – 2016-12-07 (×2): 100 mg via ORAL
  Filled 2016-12-05 (×2): qty 1

## 2016-12-05 MED ORDER — PROMETHAZINE HCL 25 MG PO TABS: 25.0000 mg | ORAL_TABLET | ORAL | Status: DC | PRN

## 2016-12-05 MED ORDER — POLYETHYLENE GLYCOL 3350 17 G PO PACK: 17.0000 g | PACK | Freq: Every day | ORAL | Status: DC | PRN

## 2016-12-05 MED ORDER — SODIUM CHLORIDE 0.9% FLUSH
3.0000 mL | Freq: Two times a day (BID) | INTRAVENOUS | Status: DC
  Administered 2016-12-05 – 2016-12-07 (×3): 3 mL via INTRAVENOUS

## 2016-12-05 MED ORDER — ACETAMINOPHEN 500 MG PO TABS: 500.0000 mg | ORAL_TABLET | Freq: Three times a day (TID) | ORAL | Status: DC | PRN

## 2016-12-05 MED ORDER — ATROPINE SULFATE 1 % OP SOLN: 4.0000 [drp] | OPHTHALMIC | Status: DC | PRN

## 2016-12-05 MED ORDER — SODIUM CHLORIDE 0.9 % IV SOLN: Freq: Once | INTRAVENOUS | Status: DC

## 2016-12-05 MED ORDER — LORAZEPAM 0.5 MG PO TABS: 0.5000 mg | ORAL_TABLET | Freq: Three times a day (TID) | ORAL | Status: DC | PRN

## 2016-12-05 MED ORDER — MORPHINE SULFATE (CONCENTRATE) 10 MG/0.5ML PO SOLN: 10.0000 mg | ORAL | Status: DC | PRN

## 2016-12-05 MED ORDER — DULOXETINE HCL 60 MG PO CPEP
60.0000 mg | ORAL_CAPSULE | Freq: Every day | ORAL | Status: DC
  Administered 2016-12-06 – 2016-12-07 (×2): 60 mg via ORAL
  Filled 2016-12-05 (×2): qty 1

## 2016-12-05 MED ORDER — FLUTICASONE PROPIONATE HFA 44 MCG/ACT IN AERO: 1.0000 | INHALATION_SPRAY | Freq: Two times a day (BID) | RESPIRATORY_TRACT | Status: DC

## 2016-12-05 MED ORDER — OXYCODONE HCL 5 MG PO TABS: 5.0000 mg | ORAL_TABLET | ORAL | Status: DC | PRN

## 2016-12-05 MED ORDER — SODIUM CHLORIDE 0.9 % IV SOLN
INTRAVENOUS | Status: AC
  Administered 2016-12-05: 23:00:00 via INTRAVENOUS

## 2016-12-05 MED ORDER — FAMOTIDINE 20 MG PO TABS
20.0000 mg | ORAL_TABLET | Freq: Every day | ORAL | Status: DC
  Administered 2016-12-05 – 2016-12-07 (×3): 20 mg via ORAL
  Filled 2016-12-05 (×3): qty 1

## 2016-12-05 MED ORDER — PROMETHAZINE HCL 25 MG RE SUPP: 25.0000 mg | Freq: Four times a day (QID) | RECTAL | Status: DC | PRN

## 2016-12-05 MED ORDER — BUDESONIDE 0.25 MG/2ML IN SUSP
0.2500 mg | Freq: Two times a day (BID) | RESPIRATORY_TRACT | Status: DC
  Administered 2016-12-06 – 2016-12-07 (×3): 0.25 mg via RESPIRATORY_TRACT
  Filled 2016-12-05 (×3): qty 2

## 2016-12-05 NOTE — ED Notes (Signed)
This RN called Mickeal Skinner (son/POA) and gotten verbal consent to receive a blood transfusion. Lowella Bandy, RN also spoke with Mickeal Skinner for a second witness.

## 2016-12-05 NOTE — ED Triage Notes (Signed)
Pt from [facility] memory care unit with ems. Staff did routine blood work this morning and the pts hemoglobin was 4.6 hematocrit 16.4 and RBC 2.64. No other complaints. Pt at baseline orientation. VSS 135/87 99% room air, HR 88 NAD

## 2016-12-05 NOTE — ED Notes (Signed)
Portable xray at bedside.

## 2016-12-05 NOTE — ED Provider Notes (Signed)
n [facility] DEPT Provider Note   CSN: [id number] Arrival date & time: 12/05/16  1550     History   Chief Complaint Chief Complaint  Patient presents with  . Abnormal Labs    HPI Deborah [name] is a 71 y.o. female.  The history is provided by the nursing home. No language interpreter was used.    Deborah [name] is a 71 y.o. female who presents to the Emergency Department complaining of abnormal labs.  Level V caveat due to dementia.  He presents from [facility] care [facility]. She had a lab draw this morning that demonstrated a hemoglobin of 4.6 and was referred to the emergency department for further evaluation. She is currently on Hospice. Past Medical History:  Diagnosis Date  . Abscess of face 05/06/2014  . Bipolar disorder (HCC)    [name], NP  . Cardiomegaly   . Chronic headache   . COPD (chronic obstructive pulmonary disease) (HCC)   . Dementia   . Depression   . Diverticulosis of colon (without mention of hemorrhage)   . Esophageal reflux   . Family history of colon cancer   . Gout flare    Left toe  . Headache(784.0)   . Hepatomegaly   . Hiatal hernia   . Hyperlipidemia   . Hypertension   . Irritable bowel syndrome   . Nausea   . Other chronic nonalcoholic liver disease   . Other specified disorders of liver   . Peripheral neuropathy (HCC)   . PLMD (periodic limb movement disorder)     Patient Active Problem List   Diagnosis Date Noted  . Acute encephalopathy   . Hypokalemia   . HCAP (healthcare-associated pneumonia) 03/31/2015  . HLD (hyperlipidemia) 05/18/2014  . Stroke due to intracerebral hemorrhage (HCC) 05/14/2014  . Stroke (HCC) 05/14/2014  . Stress incontinence 04/20/2013  . Moderate dementia 04/20/2013  . PLMD (periodic limb movement disorder) 03/01/2013  . Hypoxia, sleep related 07/05/2011  . ACQUIRED HEMOLYTIC ANEMIA UNSPECIFIED 08/08/2010  . HYPERGLYCEMIA 06/01/2010  . HYPONATREMIA 04/11/2010  .  HYPERLIPIDEMIA 03/24/2010  . PULMONARY NODULE 11/24/2009  . FATTY LIVER DISEASE 03/01/2009  . HEPATOMEGALY 03/01/2009  . 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 ASTHMA 09/17/2008  . 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 HYPERCHOLESTEROLEMIA 10/28/2007  . Bipolar disorder (HCC) 10/28/2007  . Essential hypertension 10/28/2007  . CARDIOMEGALY 10/28/2007  . ALLERGIC RHINITIS CAUSE UNSPECIFIED 10/28/2007  . COPD 10/28/2007  . HIATAL HERNIA 10/28/2007  . DIVERTICULAR DISEASE 10/28/2007  . IRRITABLE BOWEL SYNDROME 10/28/2007  . HEPATIC CYST 10/28/2007  . DEGENERATIVE JOINT DISEASE 10/28/2007  . HEADACHE, CHRONIC 10/28/2007  . ARRHYTHMIA, HX OF 10/28/2007    Past Surgical History:  Procedure Laterality Date  . ABDOMINAL HYSTERECTOMY     partial  . APPENDECTOMY    . CHOLECYSTECTOMY    . OVARIAN CYST REMOVAL    . removal of cyst on chest wall      OB History    No data available       Home Medications    Prior to Admission medications   Medication Sig Start Date End Date Taking? Authorizing Provider  acetaminophen (TYLENOL) 500 MG tablet Take 500 mg by mouth every 8 (eight) hours as needed (pain).    Historical Provider, MD  albuterol (VENTOLIN HFA) 108 (90 BASE) MCG/ACT inhaler Inhale 1 puff into the lungs every 6 (six) hours as needed for wheezing or shortness of breath (nocturnal coughing).    Historical  Provider, MD  allopurinol (ZYLOPRIM) 100 MG tablet Take 100 mg by mouth daily.    Historical Provider, MD  amoxicillin-clavulanate (AUGMENTIN XR) 1000-62.5 MG per tablet Take 2 tablets by mouth every 12 (twelve) hours. Patient not taking: Reported on 01/31/2016 04/07/15   Jeralyn Bennett, MD  atropine 1 % ophthalmic solution Place 4 drops into both eyes every 2 (two) hours as needed (terminal secretions).    Historical Provider, MD  Cholecalciferol (VITAMIN D) 2000 UNITS tablet Take 2,000 Units by mouth daily.    Historical Provider, MD  cloNIDine (CATAPRES) 0.1 MG tablet Take  0.1 mg by mouth daily as needed (SBP >180).     Historical Provider, MD  divalproex (DEPAKOTE ER) 250 MG 24 hr tablet Take 250-500 mg by mouth 2 (two) times daily. Pt takes one in the morning and two at night.    Historical Provider, MD  DULoxetine (CYMBALTA) 60 MG capsule Take 60 mg by mouth daily.    Historical Provider, MD  ENSURE PLUS (ENSURE PLUS) LIQD Take 237 mLs by mouth 2 (two) times daily between meals.    Historical Provider, MD  famotidine (PEPCID) 20 MG tablet Take 20 mg by mouth daily.    Historical Provider, MD  fluticasone (FLOVENT HFA) 44 MCG/ACT inhaler Inhale 1 puff into the lungs 2 (two) times daily. Rinse mouth after use    Historical Provider, MD  guaifenesin (ROBITUSSIN CHEST CONGESTION) 100 MG/5ML syrup Take 200 mg by mouth 3 (three) times daily as needed for cough or congestion.    Historical Provider, MD  ipratropium-albuterol (DUONEB) 0.5-2.5 (3) MG/3ML SOLN Take 3 mLs by nebulization every 6 (six) hours. For shortness of breath 07/05/11   Agapito Games, MD  loperamide (IMODIUM) 2 MG capsule Take 2 mg by mouth as needed for diarrhea or loose stools.    Historical Provider, MD  LORazepam (ATIVAN) 0.5 MG tablet Take 0.5 mg by mouth every 8 (eight) hours as needed for anxiety.    Historical Provider, MD  losartan (COZAAR) 100 MG tablet Take 0.5 tablets (50 mg total) by mouth daily. 04/07/15   Jeralyn Bennett, MD  metoprolol (LOPRESSOR) 50 MG tablet Take 1 tablet (50 mg total) by mouth 2 (two) times daily. 04/07/15   Jeralyn Bennett, MD  oxyCODONE (OXY IR/ROXICODONE) 5 MG immediate release tablet Take 5 mg by mouth every 4 (four) hours as needed for severe pain.    Historical Provider, MD  polyethylene glycol (MIRALAX / GLYCOLAX) packet Take 17 g by mouth daily as needed for mild constipation or moderate constipation.     Historical Provider, MD  Prenatal Vit-Fe Fumarate-FA (MULTIVITAMIN-PRENATAL) 27-0.8 MG TABS tablet Take 1 tablet by mouth daily at 12 noon. For anemia     Historical Provider, MD  promethazine (PHENERGAN) 25 MG suppository Place 25 mg rectally every 6 (six) hours as needed for nausea or vomiting.    Historical Provider, MD  promethazine (PHENERGAN) 25 MG tablet Take 25 mg by mouth every 4 (four) hours as needed for nausea or vomiting.    Historical Provider, MD  QUEtiapine (SEROQUEL) 25 MG tablet Take 25 mg by mouth at bedtime.    Historical Provider, MD    Family History Family History  Problem Relation Age of Onset  . Emphysema Mother   . Heart disease Father   . Mental illness Father     Bipolar disorder  . Alzheimer's disease Brother   . Colon cancer Maternal Grandfather   . Bipolar disorder Brother  Social History Social History  Substance Use Topics  . Smoking status: Never Smoker  . Smokeless tobacco: Never Used  . Alcohol use No     Allergies   Bee venom; Dicyclomine hcl; Omeprazole; Other; and Statins   Review of Systems Review of Systems  Unable to perform ROS: Dementia     Physical Exam Updated Vital Signs BP (!) 100/54   Pulse 87   Temp 98.4 F (36.9 C) (Oral)   Resp 18   Ht  (1.626 m)   Wt 170 lb (77.1 kg)   SpO2 100%   BMI 29.18 kg/m   Physical Exam  Constitutional: She appears well-developed and well-nourished.  HENT:  Head: Normocephalic and atraumatic.  Cardiovascular: Normal rate and regular rhythm.   No murmur heard. Pulmonary/Chest: Effort normal and breath sounds normal. No respiratory distress.  Abdominal: Soft. There is no tenderness. There is no rebound and no guarding.  Musculoskeletal: She exhibits no edema or tenderness.  Neurological: She is alert.  confused  Skin: Skin is warm and dry. There is pallor.  Psychiatric: She has a normal mood and affect. Her behavior is normal.  Nursing note and vitals reviewed.    ED Treatments / Results  Labs (all labs ordered are listed, but only abnormal results are displayed) Labs Reviewed  BASIC METABOLIC PANEL - Abnormal;  Notable for the following:       Result Value   Calcium 8.5 (*)    All other components within normal limits  CBC WITH DIFFERENTIAL/PLATELET - Abnormal; Notable for the following:    RBC 2.63 (*)    Hemoglobin 4.5 (*)    HCT 16.4 (*)    MCV 62.4 (*)    MCH 17.1 (*)    MCHC 27.4 (*)    RDW 18.1 (*)    Platelets 442 (*)    All other components within normal limits  POC OCCULT BLOOD, ED  PREPARE RBC (CROSSMATCH)  TYPE AND SCREEN    EKG  EKG Interpretation None       Radiology Dg Chest Port 1 View  Result Date: 12/05/2016 CLINICAL DATA:  Anemia and weakness. EXAM: PORTABLE CHEST 1 VIEW COMPARISON:  01/31/2016 FINDINGS: 1759 hours. Patient is rotated to the left. The cardio pericardial silhouette is enlarged. No evidence for focal airspace consolidation or overt pulmonary edema. No substantial pleural effusion. Bones are demineralized. Telemetry leads overlie the chest. IMPRESSION: Rotated film with cardiomegaly.  No acute cardiopulmonary findings. Electronically Signed   By: Kennith Center M.D.   On: 12/05/2016 18:23    Procedures Procedures (including critical care time)  Medications Ordered in ED Medications  0.9 %  sodium chloride infusion (not administered)     Initial Impression / Assessment and Plan / ED Course  I have reviewed the triage vital signs and the nursing notes.  Pertinent labs & imaging results that were available during my care of the patient were reviewed by me and considered in my medical decision making (see chart for details).   patient from memory care unit here for evaluation of anemia on outpatient lab draw. She is pale on examination but with no complaints. She is confused and at her baseline. Discussed with hospice nurse, Morrie Sheldon, as well as the patient's son, [name]. Given the findings of anemia family would like the patient transfused for comfort with return back to facility after transfusion and reinstate hospice at that time. Hospitalist contacted  for admission for transfusion.  Final Clinical Impressions(s) / ED Diagnoses  Final diagnoses:  None    New Prescriptions New Prescriptions   No medications on file     Tilden Fossa, MD 12/05/16 [phone]

## 2016-12-05 NOTE — ED Notes (Signed)
Attempted report x1. 

## 2016-12-05 NOTE — H&P (Signed)
History and Physical    Deborah [name] [name] QMV:[id number] DOB: [birth year] DOA: 12/05/2016  PCP: [name], MD Also [facility] Patient coming from: SNF   Chief Complaint: Low Hgb  HPI: Deborah [name] Deborah [name] is a 71 y.o. female with medical history significant of HTN, HLD, Dementia, depression, COPD, bipolar disorder who is currently in a memory care facility and on hospice for dementia.  Patient was unable to provide any additional history due to being confused and not oriented to person, place or time.  Most of history obtained from discussion with EDP and son Deborah [name] [name] on the phone.  Per chart review, patient was at her facility today and labs were drawn, no clear reason for labs were given at the time of her admission.  At that time, her Hgb was found to be very low around 4.  There had been no report of bleeding or change in status.  The patient denies any symptoms to me including chest pain, dizziness or blood loss.  Discussion with son based on these findings, would like to rescind hospice so that she can get a blood transfusion and then have her return to hospice once blood counts improve.  She will remain DNR.  I confirmed this with the son.     ED Course: Hgb confirmed low at 4.5 with a Hct of 62.4 and an MCV of 62.4.  Hemocult in the ED was negative.  Smear showed elliptocytes and tear drop cells, but other cell lines were WNL (WBC of 6.4 and Plt of 442).     Review of Systems: As per HPI otherwise 10 point review of systems negative, though patient with dementia and reporting negative to everything.    Some reviewed with family, some unknown to family.  Past Medical History:  Diagnosis Date  . Abscess of face 05/06/2014  . Bipolar disorder (HCC)    Deborah Fordyce, NP  . Cardiomegaly   . Chronic headache   . COPD (chronic obstructive pulmonary disease) (HCC)   . Dementia   . Depression   . Diverticulosis of colon (without mention of hemorrhage)   . Esophageal reflux     . Family history of colon cancer   . Gout flare    Left toe  . Headache(784.0)   . Hepatomegaly   . Hiatal hernia   . Hyperlipidemia   . Hypertension   . Irritable bowel syndrome   . Nausea   . Other chronic nonalcoholic liver disease   . Other specified disorders of liver   . Peripheral neuropathy (HCC)   . PLMD (periodic limb movement disorder)     Past Surgical History:  Procedure Laterality Date  . ABDOMINAL HYSTERECTOMY     partial  . APPENDECTOMY    . CHOLECYSTECTOMY    . OVARIAN CYST REMOVAL    . removal of cyst on chest wall     Reviewed  reports that she has never smoked. She has never used smokeless tobacco. She reports that she does not drink alcohol or use drugs.  Allergies  Allergen Reactions  . Bee Venom Anaphylaxis  . Dicyclomine Hcl Anaphylaxis  . Omeprazole Other (See Comments)     GI upset.  . Other Other (See Comments)    Reaction to reductase inhibitors per MAR  . Statins Other (See Comments)    Myalgias    Attempted to review with family, some was known, but others unknown to family.  Son Deborah Hua could not confirm colon cancer in a family member.  Family History  Problem Relation Age of Onset  . Emphysema Mother   . Heart disease Father   . Mental illness Father     Bipolar disorder  . Alzheimer's disease Brother   . Colon cancer Maternal Grandfather   . Bipolar disorder Brother     Prior to Admission medications   Medication Sig Start Date End Date Taking? Authorizing Provider  acetaminophen (TYLENOL) 500 MG tablet Take 500 mg by mouth every 8 (eight) hours as needed (pain).    Historical Provider, MD  albuterol (VENTOLIN HFA) 108 (90 BASE) MCG/ACT inhaler Inhale 1 puff into the lungs every 6 (six) hours as needed for wheezing or shortness of breath (nocturnal coughing).    Historical Provider, MD  allopurinol (ZYLOPRIM) 100 MG tablet Take 100 mg by mouth daily.    Historical Provider, MD         atropine 1 % ophthalmic solution Place 4  drops into both eyes every 2 (two) hours as needed (terminal secretions).    Historical Provider, MD  Cholecalciferol (VITAMIN D) 2000 UNITS tablet Take 2,000 Units by mouth daily.    Historical Provider, MD         divalproex (DEPAKOTE ER) 250 MG 24 hr tablet Take 250-500 mg by mouth 2 (two) times daily. Pt takes one in the morning and two at night.    Historical Provider, MD  DULoxetine (CYMBALTA) 60 MG capsule Take 60 mg by mouth daily.    Historical Provider, MD         famotidine (PEPCID) 20 MG tablet Take 20 mg by mouth daily.    Historical Provider, MD  fluticasone (FLOVENT HFA) 44 MCG/ACT inhaler Inhale 1 puff into the lungs 2 (two) times daily. Rinse mouth after use    Historical Provider, MD                loperamide (IMODIUM) 2 MG capsule Take 2 mg by mouth as needed for diarrhea or loose stools.    Historical Provider, MD  LORazepam (ATIVAN) 0.5 MG tablet Take 0.5 mg by mouth every 8 (eight) hours as needed for anxiety.    Historical Provider, MD  losartan (COZAAR) 100 MG tablet Take 0.5 tablets (50 mg total) by mouth daily. 04/07/15   Jeralyn Bennett, MD  metoprolol (LOPRESSOR) 50 MG tablet Take 1 tablet (50 mg total) by mouth 2 (two) times daily. 04/07/15   Jeralyn Bennett, MD  oxyCODONE (OXY IR/ROXICODONE) 5 MG immediate release tablet Take 5 mg by mouth every 4 (four) hours as needed for severe pain.    Historical Provider, MD  polyethylene glycol (MIRALAX / GLYCOLAX) packet Take 17 g by mouth daily as needed for mild constipation or moderate constipation.     Historical Provider, MD         promethazine (PHENERGAN) 25 MG suppository Place 25 mg rectally every 6 (six) hours as needed for nausea or vomiting.    Historical Provider, MD  promethazine (PHENERGAN) 25 MG tablet Take 25 mg by mouth every 4 (four) hours as needed for nausea or vomiting.    Historical Provider, MD           Physical Exam: Vitals:   12/05/16 1900 12/05/16 1915 12/05/16 1918 12/05/16 2000  BP: (!) 101/57  115/71 115/71 103/76  Pulse: 91 93 94 93  Resp: Temp:   98.5 F (36.9 C)   TempSrc:   Oral   SpO2: 100% 100% 100% 100%  Weight:  Height:        Constitutional: Elderly woman, no acute distress Vitals:   12/05/16 1900 12/05/16 1915 12/05/16 1918 12/05/16 2000  BP: (!) 101/57 115/71 115/71 103/76  Pulse: 91 93 94 93  Resp: Temp:   98.5 F (36.9 C)   TempSrc:   Oral   SpO2: 100% 100% 100% 100%  Weight:      Height:       Eyes: PERRL, + conjunctival pallor, no scleral icterus ENMT: Mucous membranes are mildly dry. Poor dentition Respiratory: CTAB, no wheezing, no crackles. Normal respiratory effort. N Cardiovascular: RR, NR, no murmurs.  1 + pitting edema to bilateral LE to mid calf Abdomen: Non tender, + BS, no distention Musculoskeletal: + delayed cap refill, normal muscle tone for age.  She has some chronic arthritic changes with ulnar deviation to the left hand Skin: no rashes, lesions, ulcers. No petechiae in LE, she has some dried blood on fingers, which she could not tell me where it was from.  Neurologic: Moving all extremities, some issues obeying commands and getting confused easily.  Psychiatric: Not oriented to person (when I spoke her name, she said I was speaking "baby talk to her."), place or time.  She was pleasant and not distressed by this.   Labs on Admission: I have personally reviewed following labs and imaging studies  CBC:  Recent Labs Lab 12/05/16 1650  WBC 6.4  NEUTROABS 2.9  HGB 4.5*  HCT 16.4*  MCV 62.4*  PLT 442*   Basic Metabolic Panel:  Recent Labs Lab 12/05/16 1650  NA 136  K 4.6  CL 103  CO2 25  GLUCOSE 95  BUN 16  CREATININE 0.77  CALCIUM 8.5*   GFR: Estimated Creatinine Clearance: 63.9 mL/min (by C-G formula based on SCr of 0.77 mg/dL).  Urine analysis:    Component Value Date/Time   COLORURINE YELLOW 01/31/2016 1800   APPEARANCEUR CLOUDY (A) 01/31/2016 1800   LABSPEC 1.031 (H) 01/31/2016  1800   PHURINE 5.5 01/31/2016 1800   GLUCOSEU NEGATIVE 01/31/2016 1800   HGBUR NEGATIVE 01/31/2016 1800   HGBUR negative 04/23/2010 1050   BILIRUBINUR SMALL (A) 01/31/2016 1800   BILIRUBINUR neg 04/20/2013 1213   KETONESUR 15 (A) 01/31/2016 1800   PROTEINUR NEGATIVE 01/31/2016 1800   UROBILINOGEN 1.0 03/31/2015 1238   NITRITE NEGATIVE 01/31/2016 1800   LEUKOCYTESUR NEGATIVE 01/31/2016 1800    Radiological Exams on Admission: Dg Chest Port 1 View  Result Date: 12/05/2016 CLINICAL DATA:  Anemia and weakness. EXAM: PORTABLE CHEST 1 VIEW COMPARISON:  01/31/2016 FINDINGS: 1759 hours. Patient is rotated to the left. The cardio pericardial silhouette is enlarged. No evidence for focal airspace consolidation or overt pulmonary edema. No substantial pleural effusion. Bones are demineralized. Telemetry leads overlie the chest. IMPRESSION: Rotated film with cardiomegaly.  No acute cardiopulmonary findings. Electronically Signed   By: Kennith Center M.D.   On: 12/05/2016 18:23     Assessment/Plan Acute microcytic anemia - H/O anemia in the past, previously on vitamins but not any longer per current MAR - Hemocult negative, other cell lines WNL (plts mildly elevated) - Patient is currently in hospice care, family has consented to blood transfusions - Check iron panel, consider restarting oral iron after tranfusion - In this case, would not pursue further aggressive work up if blood counts respond as expected    Essential hypertension - BP on the soft side while in the ED, 90s/50s.   - Hold anti-hypertensives, metoprolol,  losartan - Monitor in SDU given acute anemia as well    Dementia - Patient with apparent severe dementia, on hospice - Will need frequent monitoring and redirecting  Gout - Continue allopurinol    COPD (chronic obstructive pulmonary disease) - Continue home albuterol PRN nebs for worsening SOB  DVT prophylaxis: SCDs due to acute bleeding Code Status: DNR, confirmed  with son Deborah Hua Family Communication: Son, Deborah [name] [name]  Disposition Plan: Admit for blood transfusion, likely discharge tmw Consults called: None Admission status: SDU, Tyrone Nine MD [facility] Pager [phone]  If 7PM-7AM, please contact night-coverage [email or web address] Password [facility]  12/05/2016, 8:13 PM

## 2016-12-06 DIAGNOSIS — D62 Acute posthemorrhagic anemia: Secondary | ICD-10-CM | POA: Diagnosis not present

## 2016-12-06 DIAGNOSIS — F0390 Unspecified dementia without behavioral disturbance: Secondary | ICD-10-CM | POA: Diagnosis not present

## 2016-12-06 DIAGNOSIS — D509 Iron deficiency anemia, unspecified: Secondary | ICD-10-CM | POA: Diagnosis not present

## 2016-12-06 LAB — CBC
HCT: 23.6 % — ABNORMAL LOW (ref 36.0–46.0)
Hemoglobin: 7.3 g/dL — ABNORMAL LOW (ref 12.0–15.0)
MCH: 20.8 pg — ABNORMAL LOW (ref 26.0–34.0)
MCHC: 30.9 g/dL (ref 30.0–36.0)
MCV: 67.2 fL — AB (ref 78.0–100.0)
Platelets: 398 10*3/uL (ref 150–400)
RBC: 3.51 MIL/uL — ABNORMAL LOW (ref 3.87–5.11)
RDW: 22.6 % — ABNORMAL HIGH (ref 11.5–15.5)
WBC: 7.1 10*3/uL (ref 4.0–10.5)

## 2016-12-06 LAB — IRON AND TIBC
Iron: 57 ug/dL (ref 28–170)
SATURATION RATIOS: 12 % (ref 10.4–31.8)
TIBC: 494 ug/dL — AB (ref 250–450)
UIBC: 437 ug/dL

## 2016-12-06 LAB — FERRITIN: Ferritin: 2 ng/mL — ABNORMAL LOW (ref 11–307)

## 2016-12-06 LAB — PREPARE RBC (CROSSMATCH)

## 2016-12-06 MED ORDER — SODIUM CHLORIDE 0.9 % IV SOLN
Freq: Once | INTRAVENOUS | Status: AC
  Administered 2016-12-06: 13:00:00 via INTRAVENOUS

## 2016-12-06 MED ORDER — FUROSEMIDE 10 MG/ML IJ SOLN
20.0000 mg | Freq: Once | INTRAMUSCULAR | Status: AC
  Administered 2016-12-06: 20 mg via INTRAVENOUS
  Filled 2016-12-06: qty 2

## 2016-12-06 MED ORDER — ACETAMINOPHEN 325 MG PO TABS
650.0000 mg | ORAL_TABLET | Freq: Once | ORAL | Status: AC
  Administered 2016-12-06: 650 mg via ORAL
  Filled 2016-12-06: qty 2

## 2016-12-06 MED ORDER — DIPHENHYDRAMINE HCL 25 MG PO CAPS
25.0000 mg | ORAL_CAPSULE | Freq: Once | ORAL | Status: AC
  Administered 2016-12-06: 25 mg via ORAL
  Filled 2016-12-06: qty 1

## 2016-12-06 NOTE — Progress Notes (Signed)
Patient ID: Deborah [name], female   DOB: 09-Mar-1946, 71 y.o.   MRN: [id number]                                                                 PROGRESS NOTE                                                                                                                                                                                                             Patient Demographics:    Deborah [name], is a 71 y.o. female, DOB - 11-13-1945, VOZ:[id number]  Admit date - 12/05/2016   Admitting Physician Inez Catalina, MD  Outpatient Primary MD for the patient is METHENEY,CATHERINE, MD  LOS - 0  Outpatient Specialists:   Chief Complaint  Patient presents with  . Abnormal Labs       Brief Narrative  71 y.o. female with medical history significant of HTN, HLD, Dementia, depression, COPD, bipolar disorder who is currently in a memory care facility and on hospice for dementia.  Patient was unable to provide any additional history due to being confused and not oriented to person, place or time.  Most of history obtained from discussion with EDP and son Fiana Gladu on the phone.  Per chart review, patient was at her facility today and labs were drawn, no clear reason for labs were given at the time of her admission.  At that time, her Hgb was found to be very low around 4.  There had been no report of bleeding or change in status.  The patient denies any symptoms to me including chest pain, dizziness or blood loss.  Discussion with son based on these findings, would like to rescind hospice so that she can get a blood transfusion and then have her return to hospice once blood counts improve.  She will remain DNR.  I confirmed this with the son.     ED Course: Hgb confirmed low at 4.5 with a Hct of 62.4 and an MCV of 62.4.  Hemocult in the ED was negative.  Smear showed elliptocytes and tear drop cells, but other cell lines were WNL (WBC of 6.4 and Plt of 442).      Subjective:    Deborah [name] today  is unable to provide reliable history due to  her dementia. No brbpr per nursing staff.    Assessment  & Plan :    Active Problems:   Essential hypertension   Moderate dementia   Acute blood loss anemia   Hyperlipidemia   COPD (chronic obstructive pulmonary disease) (HCC)   Acute microcytic anemia - H/O anemia in the past, previously on vitamins but not any longer per current MAR - Hemocult negative, other cell lines WNL (plts mildly elevated) - Patient is currently in hospice care, family has consented to blood transfusions Will transfuse with 1 further unit prbc today Check cbc in am    Essential hypertension - BP on the soft side while in the ED, 90s/50s.   - Hold anti-hypertensives, metoprolol, losartan - Monitor in SDU given acute anemia as well    Dementia - Patient with apparent severe dementia, on hospice - Will need frequent monitoring and redirecting  Gout - Continue allopurinol    COPD (chronic obstructive pulmonary disease) - Continue home albuterol PRN nebs for worsening SOB  DVT prophylaxis: SCDs due to acute bleeding Code Status: DNR, confirmed with son [name] Family Communication: Son, Jhoselyn Ruffini  Disposition Plan: Admit for blood transfusion, likely discharge tmw Consults called: None Admission status: SDU, obs    Lab Results  Component Value Date   PLT 398 12/06/2016      Anti-infectives    None        Objective:   Vitals:   12/06/16 0800 12/06/16 0842 12/06/16 0900 12/06/16 1000  BP: (!) 113/54 (!) 113/54  134/62  Pulse: 83 99 87 94  Resp: Temp:  98.1 F (36.7 C)    TempSrc:  Oral    SpO2: 99% 100% 100% 100%  Weight:      Height:        Wt Readings from Last 3 Encounters:  12/05/16 77.1 kg (170 lb)  04/03/15 88 kg (194 lb 0.1 oz)  05/14/14 104.3 kg (229 lb 15 oz)     Intake/Output Summary (Last 24 hours) at 12/06/16 1137 Last data filed at 12/06/16 0800  Gross per 24 hour  Intake           961.08 ml    Output                0 ml  Net           961.08 ml     Physical Exam  Awake Alert, Oriented X 3, No new F.N deficits, Normal affect [place].AT,PERRAL Supple Neck,No JVD, No cervical lymphadenopathy appriciated.  Symmetrical Chest wall movement, Good air movement bilaterally, CTAB RRR,No Gallops,Rubs or new Murmurs, No Parasternal Heave +ve B.Sounds, Abd Soft, No tenderness, No organomegaly appriciated, No rebound - guarding or rigidity. No Cyanosis, Clubbing or edema, No new Rash or bruise      Data Review:    CBC  Recent Labs Lab 12/05/16 1650 12/06/16 0209  WBC 6.4 7.1  HGB 4.5* 7.3*  HCT 16.4* 23.6*  PLT 442* 398  MCV 62.4* 67.2*  MCH 17.1* 20.8*  MCHC 27.4* 30.9  RDW 18.1* 22.6*  LYMPHSABS 2.2  --   MONOABS 1.0  --   EOSABS 0.3  --   BASOSABS 0.0  --     Chemistries   Recent Labs Lab 12/05/16 1650  NA 136  K 4.6  CL 103  CO2 25  GLUCOSE 95  BUN 16  CREATININE 0.77  CALCIUM 8.5*   ------------------------------------------------------------------------------------------------------------------ No results for input(s): CHOL, HDL,  LDLCALC, TRIG, CHOLHDL, LDLDIRECT in the last 72 hours.  Lab Results  Component Value Date   HGBA1C 6.1 (H) 05/16/2014   ------------------------------------------------------------------------------------------------------------------ No results for input(s): TSH, T4TOTAL, T3FREE, THYROIDAB in the last 72 hours.  Invalid input(s): FREET3 ------------------------------------------------------------------------------------------------------------------  Recent Labs  12/06/16 0209  FERRITIN 2*  TIBC 494*  IRON 57    Coagulation profile No results for input(s): INR, PROTIME in the last 168 hours.  No results for input(s): DDIMER in the last 72 hours.  Cardiac Enzymes No results for input(s): CKMB, TROPONINI, MYOGLOBIN in the last 168 hours.  Invalid input(s):  CK ------------------------------------------------------------------------------------------------------------------ No results found for: BNP  Inpatient Medications  Scheduled Meds: . sodium chloride   Intravenous Once  . allopurinol  100 mg Oral Daily  . budesonide (PULMICORT) nebulizer solution  0.25 mg Nebulization BID  . DULoxetine  60 mg Oral Daily  . famotidine  20 mg Oral Daily  . sodium chloride flush  3 mL Intravenous Q12H   Continuous Infusions: PRN Meds:.acetaminophen, albuterol, atropine, loperamide, LORazepam, morphine CONCENTRATE, oxyCODONE, polyethylene glycol, promethazine, promethazine  Micro Results Recent Results (from the past 240 hour(s))  MRSA PCR Screening     Status: None   Collection Time: 12/05/16  9:35 PM  Result Value Ref Range Status   MRSA by PCR NEGATIVE NEGATIVE Final    Comment:        The GeneXpert MRSA Assay (FDA approved for NASAL specimens only), is one component of a comprehensive MRSA colonization surveillance program. It is not intended to diagnose MRSA infection nor to guide or monitor treatment for MRSA infections.     Radiology Reports Dg Chest Port 1 View  Result Date: 12/05/2016 CLINICAL DATA:  Anemia and weakness. EXAM: PORTABLE CHEST 1 VIEW COMPARISON:  01/31/2016 FINDINGS: 1759 hours. Patient is rotated to the left. The cardio pericardial silhouette is enlarged. No evidence for focal airspace consolidation or overt pulmonary edema. No substantial pleural effusion. Bones are demineralized. Telemetry leads overlie the chest. IMPRESSION: Rotated film with cardiomegaly.  No acute cardiopulmonary findings. Electronically Signed   By: Kennith Center M.D.   On: 12/05/2016 18:23    Time Spent in minutes  30   Pearson Grippe M.D on 12/06/2016 at 11:37 AM  Between 7am to 7pm - Pager - [phone]  After 7pm go to [email or web address] - password [facility]  [facility] -  Office  [phone]

## 2016-12-06 NOTE — Care Management Note (Signed)
Case Management Note  Patient Details  Name: AIREL MAGADAN MRN: [id number] Date of Birth: [birth year]  Subjective/Objective:    Pt admitted with anemia                Action/Plan:  PTA from [facility] - per [facility].  Pt is active with the hospice agency.  CSW consulted for discharge tentative tomorrow   Expected Discharge Date:                  Expected Discharge Plan:  Skilled Nursing Facility (currently in a memory care facility and on hospice for dementia.  )  In-House Referral:  Clinical Social Work  Discharge planning Services  CM Consult  Post Acute Care Choice:    Choice offered to:     DME Arranged:    DME Agency:     HH Arranged:    HH Agency:     Status of Service:     If discussed at [employer], dates discussed:    Additional Comments:  Cherylann Parr, RN 12/06/2016, 1:20 PM

## 2016-12-07 DIAGNOSIS — F0390 Unspecified dementia without behavioral disturbance: Secondary | ICD-10-CM | POA: Diagnosis not present

## 2016-12-07 DIAGNOSIS — D62 Acute posthemorrhagic anemia: Secondary | ICD-10-CM | POA: Diagnosis not present

## 2016-12-07 DIAGNOSIS — D509 Iron deficiency anemia, unspecified: Secondary | ICD-10-CM | POA: Diagnosis not present

## 2016-12-07 DIAGNOSIS — J449 Chronic obstructive pulmonary disease, unspecified: Secondary | ICD-10-CM | POA: Diagnosis not present

## 2016-12-07 LAB — COMPREHENSIVE METABOLIC PANEL
ALK PHOS: 44 U/L (ref 38–126)
ALT: 12 U/L — ABNORMAL LOW (ref 14–54)
AST: 16 U/L (ref 15–41)
Albumin: 2.9 g/dL — ABNORMAL LOW (ref 3.5–5.0)
Anion gap: 11 (ref 5–15)
BUN: 10 mg/dL (ref 6–20)
CALCIUM: 8.5 mg/dL — AB (ref 8.9–10.3)
CO2: 25 mmol/L (ref 22–32)
CREATININE: 0.71 mg/dL (ref 0.44–1.00)
Chloride: 103 mmol/L (ref 101–111)
Glucose, Bld: 79 mg/dL (ref 65–99)
Potassium: 3.7 mmol/L (ref 3.5–5.1)
Sodium: 139 mmol/L (ref 135–145)
TOTAL PROTEIN: 5.3 g/dL — AB (ref 6.5–8.1)
Total Bilirubin: 0.9 mg/dL (ref 0.3–1.2)

## 2016-12-07 LAB — BPAM RBC
BLOOD PRODUCT EXPIRATION DATE: 201804132359
BLOOD PRODUCT EXPIRATION DATE: 201804252359
Blood Product Expiration Date: 201804252359
ISSUE DATE / TIME: 201804052028
ISSUE DATE / TIME: 201804061352
ISSUE DATE / TIME: 201804051852
UNIT TYPE AND RH: 6200
Unit Type and Rh: 6200
Unit Type and Rh: 6200

## 2016-12-07 LAB — TYPE AND SCREEN
ABO/RH(D): A POS
Antibody Screen: NEGATIVE
UNIT DIVISION: 0
UNIT DIVISION: 0
UNIT DIVISION: 0

## 2016-12-07 LAB — CBC
HCT: 25.5 % — ABNORMAL LOW (ref 36.0–46.0)
HEMOGLOBIN: 7.7 g/dL — AB (ref 12.0–15.0)
MCH: 20.9 pg — AB (ref 26.0–34.0)
MCHC: 30.2 g/dL (ref 30.0–36.0)
MCV: 69.3 fL — AB (ref 78.0–100.0)
Platelets: 347 10*3/uL (ref 150–400)
RBC: 3.68 MIL/uL — ABNORMAL LOW (ref 3.87–5.11)
RDW: 22.5 % — ABNORMAL HIGH (ref 11.5–15.5)
WBC: 5.6 10*3/uL (ref 4.0–10.5)

## 2016-12-07 MED ORDER — METOPROLOL TARTRATE 50 MG PO TABS: 25.0000 mg | ORAL_TABLET | Freq: Two times a day (BID) | ORAL | 0 refills | Status: DC

## 2016-12-07 NOTE — Progress Notes (Signed)
Report called to [facility] at [facility].

## 2016-12-07 NOTE — Discharge Summary (Signed)
Deborah [name], is a 71 y.o. female  DOB 1946/09/01  MRN [id number].  Admission date:  12/05/2016  Admitting Physician  Inez Catalina, MD  Discharge Date:  12/07/2016   Primary MD  METHENEY,CATHERINE, MD  Recommendations for primary care physician for things to follow:   Acute microcytic anemia - H/O anemia in the past, previously on vitamins but not any longer per current MAR - Hemocult negative, other cell lines WNL (plts mildly elevated) - Patient is currently in hospice care, family has consented to blood transfusions Please check cbc in 1-2 weeks. Please refer to [facility] [phone] for iv iron infusion   Essential hypertension - BP on the soft side while in the ED, 90s/50s.  - Hold anti-hypertensives, metoprolol, losartan - Monitor in SDU given acute anemia as well  Dementia - Patient with apparent severe dementia, on hospice  Gout - Continue allopurinol  COPD (chronic obstructive pulmonary disease) - Continue home albuterol and  flovent    Admission Diagnosis  Pure hypercholesterolemia [E78.00] Essential hypertension [I10] Chronic obstructive pulmonary disease, unspecified COPD type (HCC) [J44.9] Dementia without behavioral disturbance, unspecified dementia type [F03.90]   Discharge Diagnosis  Pure hypercholesterolemia [E78.00] Essential hypertension [I10] Chronic obstructive pulmonary disease, unspecified COPD type (HCC) [J44.9] Dementia without behavioral disturbance, unspecified dementia type [F03.90]    Active Problems:   Essential hypertension   Moderate dementia   Acute blood loss anemia   Hyperlipidemia   COPD (chronic obstructive pulmonary disease) (HCC)      Past Medical History:  Diagnosis Date  . Abscess of face 05/06/2014  . Bipolar disorder (HCC)    [name], NP  . Cardiomegaly   . Chronic headache   . COPD (chronic  obstructive pulmonary disease) (HCC)   . Dementia   . Depression   . Diverticulosis of colon (without mention of hemorrhage)   . Esophageal reflux   . Family history of colon cancer   . Gout flare    Left toe  . Headache(784.0)   . Hepatomegaly   . Hiatal hernia   . Hyperlipidemia   . Hypertension   . Irritable bowel syndrome   . Nausea   . Other chronic nonalcoholic liver disease   . Other specified disorders of liver   . Peripheral neuropathy (HCC)   . PLMD (periodic limb movement disorder)     Past Surgical History:  Procedure Laterality Date  . ABDOMINAL HYSTERECTOMY     partial  . APPENDECTOMY    . CHOLECYSTECTOMY    . OVARIAN CYST REMOVAL    . removal of cyst on chest wall         HPI  from the history and physical done on the day of admission:      71 y.o.femalewith medical history significant of HTN, HLD, Dementia, depression, COPD, bipolar disorder who is currently in a memory care facility and on hospice for dementia. Patient was unable to provide any additional history due to being confused and not oriented to  person, place or time. Most of history obtained from discussion with EDP and son Deborah [name] on the phone. Per chart review, patient was at her facility today and labs were drawn, no clear reason for labs were given at the time of her admission. At that time, her Hgb was found to be very low around 4. There had been no report of bleeding or change in status. The patient denies any symptoms to me including chest pain, dizziness or blood loss. Discussion with son based on these findings, would like to rescind hospice so that she can get a blood transfusion and then have her return to hospice once blood counts improve. She will remain DNR. I confirmed this with the son.   ED Course:Hgb confirmed low at 4.5 with a Hct of 62.4 and an MCV of 62.4. Hemocult in the ED was negative. Smear showed elliptocytes and tear drop cells, but other cell lines  were WNL (WBC of 6.4 and Plt of 442).     Hospital Course:     Pt received 3 units prbc total and hgb improved to 7.8.  Pt is currently asymptomatic, and doing well,  Denies fatigue, dyspnea.  Pt appears stable and will be discharge to [facility].    Follow UP  Follow-up Information    METHENEY,CATHERINE, MD Follow up in 1 week(s).   Specialty:  Family Medicine Contact information: [address] [place] [address] [phone]            Consults obtained - none  Discharge Condition: stable  Diet and Activity recommendation: See Discharge Instructions below  Discharge Instructions         Discharge Medications     Allergies as of 12/07/2016      Reactions   Bee Venom Anaphylaxis   Dicyclomine Hcl Anaphylaxis   Omeprazole Other (See Comments)    GI upset.   Other Other (See Comments)   Reaction to reductase inhibitors per [facility]   Statins Other (See Comments)   Myalgias      Medication List    STOP taking these medications   amoxicillin-clavulanate 1000-62.5 MG 12 hr tablet Commonly known as:  AUGMENTIN XR   cloNIDine 0.1 MG tablet Commonly known as:  CATAPRES   ROBITUSSIN CHEST CONGESTION 100 MG/5ML syrup Generic drug:  guaifenesin     TAKE these medications   acetaminophen 500 MG tablet Commonly known as:  TYLENOL Take 500 mg by mouth every 8 (eight) hours as needed (pain).   allopurinol 100 MG tablet Commonly known as:  ZYLOPRIM Take 100 mg by mouth daily.   atropine 1 % ophthalmic solution Place 4 drops under the tongue See admin instructions. EVERY 2-3 HOURS AS NEEDED FOR TERMINAL SECRETIONS   BAZA PROTECT EX Apply 1 application topically See admin instructions. TO BOTTOM/BUTTOCKS EVERY SHIFT (8 HRS)   COMBIVENT RESPIMAT 20-100 MCG/ACT Aers respimat Generic drug:  Ipratropium-Albuterol Inhale 1 puff into the lungs every 6 (six) hours. What changed:  Another medication with the same name was removed. Continue  taking this medication, and follow the directions you see here.   divalproex 125 MG capsule Commonly known as:  DEPAKOTE SPRINKLE Take 250-500 mg by mouth See admin instructions. 250 mg in the morning and 500 mg at bedtime   DULoxetine 60 MG capsule Commonly known as:  CYMBALTA Take 60 mg by mouth daily.   ENSURE PLUS Liqd Take 237 mLs by mouth 2 (two) times daily between meals.   famotidine 20  MG tablet Commonly known as:  PEPCID Take 20 mg by mouth daily.   FLOVENT HFA 44 MCG/ACT inhaler Generic drug:  fluticasone Inhale 1 puff into the lungs 2 (two) times daily. Rinse mouth after use   loperamide 2 MG capsule Commonly known as:  IMODIUM Take 2 mg by mouth as needed for diarrhea or loose stools.   LORazepam 0.5 MG tablet Commonly known as:  ATIVAN Take 0.5 mg by mouth every 8 (eight) hours as needed for anxiety.   losartan 100 MG tablet Commonly known as:  COZAAR Take 0.5 tablets (50 mg total) by mouth daily.   metoprolol 50 MG tablet Commonly known as:  LOPRESSOR Take 0.5 tablets (25 mg total) by mouth 2 (two) times daily.   morphine 20 MG/ML concentrated solution Commonly known as:  ROXANOL Take 10 mg by mouth every 4 (four) hours as needed (for pain).   multivitamin-prenatal 27-0.8 MG Tabs tablet Take 1 tablet by mouth daily at 12 noon. For anemia   oxyCODONE 5 MG immediate release tablet Commonly known as:  Oxy IR/ROXICODONE Take 5 mg by mouth every 4 (four) hours as needed for severe pain.   polyethylene glycol packet Commonly known as:  MIRALAX / GLYCOLAX Take 17 g by mouth daily as needed for mild constipation or moderate constipation.   promethazine 25 MG tablet Commonly known as:  PHENERGAN Take 25 mg by mouth every 4 (four) hours as needed for nausea.   promethazine 25 MG suppository Commonly known as:  PHENERGAN Place 25 mg rectally every 6 (six) hours as needed for nausea or vomiting.   QUEtiapine 25 MG tablet Commonly known as:   SEROQUEL Take 25 mg by mouth at bedtime.   VENTOLIN HFA 108 (90 Base) MCG/ACT inhaler Generic drug:  albuterol Inhale 1 puff into the lungs every 6 (six) hours as needed for wheezing or shortness of breath (nocturnal coughing).   Vitamin D 2000 units tablet Take 2,000 Units by mouth daily.       Major procedures and Radiology Reports - PLEASE review detailed and final reports for all details, in brief -      Dg Chest Port 1 View  Result Date: 12/05/2016 CLINICAL DATA:  Anemia and weakness. EXAM: PORTABLE CHEST 1 VIEW COMPARISON:  01/31/2016 FINDINGS: 1759 hours. Patient is rotated to the left. The cardio pericardial silhouette is enlarged. No evidence for focal airspace consolidation or overt pulmonary edema. No substantial pleural effusion. Bones are demineralized. Telemetry leads overlie the chest. IMPRESSION: Rotated film with cardiomegaly.  No acute cardiopulmonary findings. Electronically Signed   By: Kennith Center M.D.   On: 12/05/2016 18:23    Micro Results     Recent Results (from the past 240 hour(s))  MRSA PCR Screening     Status: None   Collection Time: 12/05/16  9:35 PM  Result Value Ref Range Status   MRSA by PCR NEGATIVE NEGATIVE Final    Comment:        The GeneXpert MRSA Assay (FDA approved for NASAL specimens only), is one component of a comprehensive MRSA colonization surveillance program. It is not intended to diagnose MRSA infection nor to guide or monitor treatment for MRSA infections.        Today   Subjective    Deborah [name] today has no difficulty with breathing,  Doing well.  No complaints, obviously history limited by dementia   Objective   Blood pressure 139/83, pulse 95, temperature 99.9 F (37.7 C), temperature source Axillary, resp.  rate 15, height  (1.626 m), weight 77.1 kg (170 lb), SpO2 98 %.   Intake/Output Summary (Last 24 hours) at 12/07/16 1118 Last data filed at 12/07/16 0028  Gross per 24 hour  Intake               395 ml  Output              750 ml  Net             -355 ml    Exam Awake Alert, Oriented x 1, No new F.N deficits, Normal affect [place].AT,PERRAL Supple Neck,No JVD, No cervical lymphadenopathy appriciated.  Symmetrical Chest wall movement, Good air movement bilaterally, CTAB RRR,No Gallops,Rubs or new Murmurs, No Parasternal Heave +ve B.Sounds, Abd Soft, Non tender, No organomegaly appriciated, No rebound -guarding or rigidity. No Cyanosis, Clubbing or edema, No new Rash or bruise   Data Review   CBC w Diff:  Lab Results  Component Value Date   WBC 5.6 12/07/2016   HGB 7.7 (L) 12/07/2016   HCT 25.5 (L) 12/07/2016   PLT 347 12/07/2016   LYMPHOPCT 35 12/05/2016   MONOPCT 15 12/05/2016   EOSPCT 4 12/05/2016   BASOPCT 1 12/05/2016    CMP:  Lab Results  Component Value Date   NA 139 12/07/2016   K 3.7 12/07/2016   CL 103 12/07/2016   CO2 25 12/07/2016   BUN 10 12/07/2016   CREATININE 0.71 12/07/2016   CREATININE 0.97 04/20/2013   PROT 5.3 (L) 12/07/2016   ALBUMIN 2.9 (L) 12/07/2016   BILITOT 0.9 12/07/2016   ALKPHOS 44 12/07/2016   AST 16 12/07/2016   ALT 12 (L) 12/07/2016  .   Total Time in preparing paper work, data evaluation and todays exam - 35 minutes  Pearson Grippe M.D on 12/07/2016 at 11:18 AM  [facility]   Office  [phone]

## 2016-12-07 NOTE — Clinical Social Work Note (Signed)
Pt is ready for discharge today and is returning to [facility]. Pt is disoriented and confused. CSW spoke with Lucinda Dell via phone. Dtr is aware and agreeable to discharge plan. CSW sent d/c summary and order to [facility] and communicated with RN Tia for report information. Per Tia, no FL-2 is needed. Report provided to RN. Transportation arranged with [facility]. CSW is signing off as no further needs identified.  Corlis Hove, Theresia Majors, [facility] Clinical Social Worker  [phone]

## 2017-01-08 ENCOUNTER — Encounter: Payer: Self-pay | Admitting: Nurse Practitioner

## 2017-01-21 ENCOUNTER — Encounter: Payer: Self-pay | Admitting: Nurse Practitioner

## 2017-01-21 ENCOUNTER — Ambulatory Visit (INDEPENDENT_AMBULATORY_CARE_PROVIDER_SITE_OTHER): Payer: Medicare Other | Admitting: Nurse Practitioner

## 2017-01-21 VITALS — BP 124/68 | HR 76

## 2017-01-21 DIAGNOSIS — F0390 Unspecified dementia without behavioral disturbance: Secondary | ICD-10-CM

## 2017-01-21 DIAGNOSIS — D509 Iron deficiency anemia, unspecified: Secondary | ICD-10-CM

## 2017-01-21 NOTE — Patient Instructions (Addendum)
If you are age 72 or older, your body mass index should be between 23-30. Your There is no height or weight on file to calculate BMI. If this is out of the aforementioned range listed, please consider follow up with your Primary Care Provider.  If you are age 72 or younger, your body mass index should be between 19-25. Your There is no height or weight on file to calculate BMI. If this is out of the aformentioned range listed, please consider follow up with your Primary Care Provider.    Will call son (POA) and see if he wants endoscopic scheduled.  Thank you for choosing me and [facility].  Willette ClusterPaula Guenther, NP

## 2017-01-21 NOTE — Progress Notes (Addendum)
Addendum :  01/28/17 I spoke with son [name], [place]. He had several questions about what EGD / colonoscopy entailed, what would we be looking for, risks of procedure , etc...  All these things were discussed with the patient. I explained that patient would need to drink a bowel prep and that someone would need to accompany her to the procedures. He also wanted to know the consequences of not proceeding with workup. I explained that she could have recurrent/ongoing problems with anemia requiring iron and blood transfusions or she may have no recurrent problems at all.  The son plans to speak with his sister. If they decide to pursue endoscopic workup [name] will call to get scheduled. He will need to come in to sign consent forms. Procedures will have to be done at the hospital as patient is on oxygen. In the interim, I recommended that PCP follow blood counts        HPI:  Patient is 71 year old female known remotely to Dr. Jarold Motto. She has dementia and resides in a memory care faciltiy. She had a complete colonoscopy for evaluation of constipation in June 2012. Findings included diverticulosis, no polyps or cancer . She is referred now by PCP Manus Gunning, NP for evaluation of iron deficiency anemia . Patient has advanced dementia, unable to provide any history. She is accompanied by an employee of the memory care facility. Patient was admitted in April for a hgb of 4.6 (down from baseline of upper 10 range). No overt bleeding and stools were heme negative. Patient was with Hospice but family opted for transfusion.  Posttransfusion Hgb rose to 7.7 and up to 8.5 on 4/27. . E valuated byPCP on 5/9 and hgb was down slightly to 7.9  Patient's family recently decided to take her off Hospice and wanted a GI evaluation.  They apparently don't want a colonoscopy   According to staff member ,patient has had blood in her stools on several occasions. She is not on a blood thinner.   Past Medical History:    Diagnosis Date  . Abscess of face 05/06/2014  . Bipolar disorder (HCC)    [name], NP  . Cardiomegaly   . Chronic headache   . COPD (chronic obstructive pulmonary disease) (HCC)   . Dementia   . Depression   . Diverticulosis of colon (without mention of hemorrhage)   . Esophageal reflux   . Family history of colon cancer   . Gout flare    Left toe  . Headache(784.0)   . Hepatomegaly   . Hiatal hernia   . Hyperlipidemia   . Hypertension   . Irritable bowel syndrome   . Nausea   . Other chronic nonalcoholic liver disease   . Other specified disorders of liver   . Peripheral neuropathy   . PLMD (periodic limb movement disorder)      Past Surgical History:  Procedure Laterality Date  . ABDOMINAL HYSTERECTOMY     partial  . APPENDECTOMY    . CHOLECYSTECTOMY    . OVARIAN CYST REMOVAL    . removal of cyst on chest wall     Family History  Problem Relation Age of Onset  . Emphysema Mother   . Heart disease Father   . Mental illness Father        Bipolar disorder  . Alzheimer's disease Brother   . Colon cancer Maternal Grandfather   . Bipolar disorder Brother    Social History  Substance Use Topics  . Smoking status:  Never Smoker  . Smokeless tobacco: Never Used  . Alcohol use No   Current Outpatient Prescriptions  Medication Sig Dispense Refill  . acetaminophen (TYLENOL) 500 MG tablet Take 500 mg by mouth every 8 (eight) hours as needed (pain).    Marland Kitchen. albuterol (VENTOLIN HFA) 108 (90 BASE) MCG/ACT inhaler Inhale 1 puff into the lungs every 6 (six) hours as needed for wheezing or shortness of breath (nocturnal coughing).    Marland Kitchen. allopurinol (ZYLOPRIM) 100 MG tablet Take 100 mg by mouth daily.    Marland Kitchen. atropine 1 % ophthalmic solution Place 4 drops under the tongue See admin instructions. EVERY 2-3 HOURS AS NEEDED FOR TERMINAL SECRETIONS    . Cholecalciferol (VITAMIN D) 2000 UNITS tablet Take 2,000 Units by mouth daily.    . divalproex (DEPAKOTE SPRINKLE) 125 MG capsule  Take 250-500 mg by mouth See admin instructions. 250 mg in the morning and 500 mg at bedtime    . DULoxetine (CYMBALTA) 60 MG capsule Take 60 mg by mouth daily.    Marland Kitchen. ENSURE PLUS (ENSURE PLUS) LIQD Take 237 mLs by mouth 2 (two) times daily between meals.    . famotidine (PEPCID) 20 MG tablet Take 20 mg by mouth daily.    . fluticasone (FLOVENT HFA) 44 MCG/ACT inhaler Inhale 1 puff into the lungs 2 (two) times daily. Rinse mouth after use    . Ipratropium-Albuterol (COMBIVENT RESPIMAT) 20-100 MCG/ACT AERS respimat Inhale 1 puff into the lungs every 6 (six) hours.    Marland Kitchen. loperamide (IMODIUM) 2 MG capsule Take 2 mg by mouth as needed for diarrhea or loose stools.    Marland Kitchen. LORazepam (ATIVAN) 0.5 MG tablet Take 0.5 mg by mouth every 8 (eight) hours as needed for anxiety.    Marland Kitchen. losartan (COZAAR) 100 MG tablet Take 0.5 tablets (50 mg total) by mouth daily. 30 tablet 0  . metoprolol (LOPRESSOR) 50 MG tablet Take 0.5 tablets (25 mg total) by mouth 2 (two) times daily. 60 tablet 0  . morphine (ROXANOL) 20 MG/ML concentrated solution Take 10 mg by mouth every 4 (four) hours as needed (for pain).    Marland Kitchen. oxyCODONE (OXY IR/ROXICODONE) 5 MG immediate release tablet Take 5 mg by mouth every 4 (four) hours as needed for severe pain.    . polyethylene glycol (MIRALAX / GLYCOLAX) packet Take 17 g by mouth daily as needed for mild constipation or moderate constipation.     . Prenatal Vit-Fe Fumarate-FA (MULTIVITAMIN-PRENATAL) 27-0.8 MG TABS tablet Take 1 tablet by mouth daily at 12 noon. For anemia    . promethazine (PHENERGAN) 25 MG suppository Place 25 mg rectally every 6 (six) hours as needed for nausea or vomiting.    . promethazine (PHENERGAN) 25 MG tablet Take 25 mg by mouth every 4 (four) hours as needed for nausea.     Marland Kitchen. QUEtiapine (SEROQUEL) 25 MG tablet Take 25 mg by mouth at bedtime.    . Skin Protectants, Misc. (BAZA PROTECT EX) Apply 1 application topically See admin instructions. TO BOTTOM/BUTTOCKS EVERY SHIFT (8  HRS)     No current facility-administered medications for this visit.    Allergies  Allergen Reactions  . Bee Venom Anaphylaxis  . Dicyclomine Hcl Anaphylaxis  . Omeprazole Other (See Comments)     GI upset.  . Other Other (See Comments)    Reaction to reductase inhibitors per MAR  . Statins Other (See Comments)    Myalgias     Review of Systems: Unable to obtain given advanced dementia. .Marland Kitchen  Physical Exam: BP 124/68   Pulse 76  Constitutional:  Well-developed, white female in no acute distress. Psychiatric: pleasant, tried to engage. EENT:. Conjunctivae are normal. No scleral icterus. Neck supple.  Cardiovascular: Normal rate, regular rhythm.  Pulmonary/chest: Effort normal and breath sounds normal. No wheezing, rales or rhonchi. Abdominal: In wheelchair, limited exam. Abdomen is soft, nondistended, nontender. Bowel sounds active  Extremities: no edema Lymphadenopathy: No cervical adenopathy noted. Neurological: Alert , follows commands. She is not oriented to place or time. Doesn't answer questions appropriatley.  Skin: Skin is warm and dry. No rashes noted.   ASSESSMENT AND PLAN:   71 yo female with dementia residing in a memory care facility. Admitted early April with profound microcytic anemia. hgb 4.5, down from baseline of 10-11. Heme negative in hospital but facility employee reports intermittent blood in stool.  Transfused, hgb up to 8.5 on 4/27 but drifted slightly to 7.8 at last check on 01/08/17. Family has apparently decided to take patient out of Hospice and requested this GI referral  Facility employee only person with patient, no family present. Her son is POA. .  -In order to evaluate recent development of profound microcytic anemia and intermittent blood in stool patient would need endoscopic workup which includes colonoscopy and +/- EGD. According to the PCP's notes, son doesn't want to pursue a colonoscopy. I will try and contact the son for further  discussion. Willette Cluster, NP  01/21/2017, 1:50 PM  Cc:  Manus Gunning, FNP

## 2017-01-24 ENCOUNTER — Telehealth: Payer: Self-pay | Admitting: Nurse Practitioner

## 2017-01-27 NOTE — Progress Notes (Signed)
Reviewed. Await NP discussion with son (? Healthcare POA)

## 2017-01-28 NOTE — Telephone Encounter (Incomplete)
I spoke with son Onalee [place], [place]. He had several questions about what EGD / colonoscopy entailed, what would be looking for, risks , etc...  These things were discussed with the patient. I did explain that she would need to drink a bowel prep and that someone would have to accompany her to the procedures. He also wanted to know the consequences of not proceeding with workup. I explained that she could have recurrent/ongoing problems with anemia requiring iron and blood transfusions or she may have no recurrent problems at all.  The son plans to speak with his sister. O

## 2017-01-28 NOTE — Telephone Encounter (Signed)
Beth, I addended my office note to show my conversation with son who is patient's POA. Please see my note.

## 2017-01-28 NOTE — Telephone Encounter (Signed)
Procedures will have to be done at the hospital as patient is on oxygen.

## 2017-01-28 NOTE — Telephone Encounter (Signed)
Patient son calling back about this.

## 2017-04-23 NOTE — Telephone Encounter (Signed)
Signed by nurse Waynetta Sandy 5.29.18. Incomplete note form NP Gunnar Fusi. No additional notes needed.

## 2018-01-27 ENCOUNTER — Non-Acute Institutional Stay: Payer: Medicare Other | Admitting: Hospice and Palliative Medicine

## 2018-01-27 DIAGNOSIS — Z515 Encounter for palliative care: Secondary | ICD-10-CM

## 2018-01-27 NOTE — Progress Notes (Signed)
PALLIATIVE CARE CONSULT VISIT   PATIENT NAME: Deborah [name] DOB: 16-Apr-1946 MRN: [id number]  PRIMARY CARE PROVIDER: Agapito Games, MD  REFERRING PROVIDER: Agapito Games, MD [phone] [address] [place], [place] [address]  RESPONSIBLE PARTY:  Bunnie Domino [phone]    RECOMMENDATIONS and PLAN:  1. Memory loss: secondary to unspecified dementia approximately 7c on FAST scale. She is non ambulatory and getting more difficult to transfer from bed to [facility]. She still feeds herself. No reports of weight loss, although I see some facial thinning. She can no longer complete sentences. She is declining cognitively and physically. Monitoring...could be hospice appropriate soon. 2. DNR status. Need to connect with son to discuss MOST form.   I spent 15 minutes providing this consultation,  from 11:30am to 11:45am. More than 50% of the time in this consultation was spent interviewing staff, assessing patient and coordinating communication.   HISTORY OF PRESENT ILLNESS:  [name] is a 72 y.o. female with multiple medical problems who resides in memory care at [facility]. Palliative Care was asked to help address symptom management and goals of care by the primary NP of the facility.   CODE STATUS: DNR  PPS: 30% HOSPICE ELIGIBILITY/DIAGNOSIS: TBD  PAST MEDICAL HISTORY:  Past Medical History:  Diagnosis Date  . Abscess of face 05/06/2014  . Bipolar disorder (HCC)    [name], NP  . Cardiomegaly   . Chronic headache   . COPD (chronic obstructive pulmonary disease) (HCC)   . Dementia   . Depression   . Diverticulosis of colon (without mention of hemorrhage)   . Esophageal reflux   . Family history of colon cancer   . Gout flare    Left toe  . Headache(784.0)   . Hepatomegaly   . Hiatal hernia   . Hyperlipidemia   . Hypertension   . Irritable bowel syndrome   . Nausea   . Other chronic nonalcoholic liver disease   . Other specified disorders  of liver   . Peripheral neuropathy   . PLMD (periodic limb movement disorder)     SOCIAL HX:  Social History   Tobacco Use  . Smoking status: Never Smoker  . Smokeless tobacco: Never Used  Substance Use Topics  . Alcohol use: No    Alcohol/week: 0.0 oz    ALLERGIES:  Allergies  Allergen Reactions  . Bee Venom Anaphylaxis  . Dicyclomine Hcl Anaphylaxis  . Omeprazole Other (See Comments)     GI upset.  . Other Other (See Comments)    Reaction to reductase inhibitors per MAR  . Statins Other (See Comments)    Myalgias      PERTINENT MEDICATIONS:  Outpatient Encounter Medications as of 01/27/2018  Medication Sig  . acetaminophen (TYLENOL) 500 MG tablet Take 500 mg by mouth every 8 (eight) hours as needed (pain).  Marland Kitchen albuterol (VENTOLIN HFA) 108 (90 BASE) MCG/ACT inhaler Inhale 1 puff into the lungs every 6 (six) hours as needed for wheezing or shortness of breath (nocturnal coughing).  Marland Kitchen allopurinol (ZYLOPRIM) 100 MG tablet Take 100 mg by mouth daily.  Marland Kitchen atropine 1 % ophthalmic solution Place 4 drops under the tongue See admin instructions. EVERY 2-3 HOURS AS NEEDED FOR TERMINAL SECRETIONS  . Cholecalciferol (VITAMIN D) 2000 UNITS tablet Take 2,000 Units by mouth daily.  . divalproex (DEPAKOTE SPRINKLE) 125 MG capsule Take 250-500 mg by mouth See admin instructions. 250 mg in the morning and 500 mg at  bedtime  . DULoxetine (CYMBALTA) 60 MG capsule Take 60 mg by mouth daily.  Marland Kitchen ENSURE PLUS (ENSURE PLUS) LIQD Take 237 mLs by mouth 2 (two) times daily between meals.  . famotidine (PEPCID) 20 MG tablet Take 20 mg by mouth daily.  . fluticasone (FLOVENT HFA) 44 MCG/ACT inhaler Inhale 1 puff into the lungs 2 (two) times daily. Rinse mouth after use  . Ipratropium-Albuterol (COMBIVENT RESPIMAT) 20-100 MCG/ACT AERS respimat Inhale 1 puff into the lungs every 6 (six) hours.  Marland Kitchen loperamide (IMODIUM) 2 MG capsule Take 2 mg by mouth as needed for diarrhea or loose stools.  Marland Kitchen LORazepam  (ATIVAN) 0.5 MG tablet Take 0.5 mg by mouth every 8 (eight) hours as needed for anxiety.  Marland Kitchen losartan (COZAAR) 100 MG tablet Take 0.5 tablets (50 mg total) by mouth daily.  . metoprolol (LOPRESSOR) 50 MG tablet Take 0.5 tablets (25 mg total) by mouth 2 (two) times daily.  Marland Kitchen morphine (ROXANOL) 20 MG/ML concentrated solution Take 10 mg by mouth every 4 (four) hours as needed (for pain).  Marland Kitchen oxyCODONE (OXY IR/ROXICODONE) 5 MG immediate release tablet Take 5 mg by mouth every 4 (four) hours as needed for severe pain.  . polyethylene glycol (MIRALAX / GLYCOLAX) packet Take 17 g by mouth daily as needed for mild constipation or moderate constipation.   . Prenatal Vit-Fe Fumarate-FA (MULTIVITAMIN-PRENATAL) 27-0.8 MG TABS tablet Take 1 tablet by mouth daily at 12 noon. For anemia  . promethazine (PHENERGAN) 25 MG suppository Place 25 mg rectally every 6 (six) hours as needed for nausea or vomiting.  . promethazine (PHENERGAN) 25 MG tablet Take 25 mg by mouth every 4 (four) hours as needed for nausea.   Marland Kitchen QUEtiapine (SEROQUEL) 25 MG tablet Take 25 mg by mouth at bedtime.  . Skin Protectants, Misc. (BAZA PROTECT EX) Apply 1 application topically See admin instructions. TO BOTTOM/BUTTOCKS EVERY SHIFT (8 HRS)   No facility-administered encounter medications on file as of 01/27/2018.     PHYSICAL EXAM:   General: Alert, Caucasian female in WC in NAD Cardiovascular: irreg rhythm; Reg rate +murmur Pulmonary: CTAB  Abdomen: soft, active BS, NTTP Extremities: too weak to stand; uses both upper extremities Skin: thin and fragile Neurological: alert; unable to complete sentences now; +generalized weakness; +worsening memory; talks nonsensically to self  Truett Perna, NP

## 2018-04-15 ENCOUNTER — Encounter: Payer: Self-pay | Admitting: Nurse Practitioner

## 2018-04-15 ENCOUNTER — Non-Acute Institutional Stay: Payer: Medicare Other | Admitting: Nurse Practitioner

## 2018-04-15 NOTE — Consult Note (Addendum)
PALLIATIVE CARE CONSULT VISIT   PATIENT NAME: Deborah [name] [place] DOB: 22-Apr-1946 MRN: [id number]  PRIMARY CARE PROVIDER:   Agapito GamesMetheney, Catherine D, MD  REFERRING PROVIDER:  Agapito GamesMetheney, Catherine D, MD [phone] [address] [place], [place] [id number]  RESPONSIBLE PARTY:   Mickeal SkinnerDavid Heidinger [phone] 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 More than 50% of the time in this consultation was spent coordinating communication.   HISTORY OF PRESENT ILLNESS:  Deborah [name] [place] is a 72 y.o. year old female with multiple medical problems including dementia with behavioral disturbance, s/p CVA,, HLD,HTN,Gout, GERD, depression/anxiety, bipolar d/o, O2 dependent COPD, gait disturbance, anemia with chronic iron infusions. Palliative Care was asked to help address goals of care.   CODE STATUS: DNR/no MOST  PPS: 0% HOSPICE ELIGIBILITY/DIAGNOSIS: TBD  PAST MEDICAL HISTORY:  Past Medical History:  Diagnosis Date  . Abscess of face 05/06/2014  . Bipolar disorder (HCC)    Saul FordyceKelly Virgil, NP  . Cardiomegaly   . Chronic headache   . COPD (chronic obstructive pulmonary disease) (HCC)   . Dementia   . Depression   . Diverticulosis of colon (without mention of hemorrhage)   . Esophageal reflux   . Family history of colon cancer   . Gout flare    Left toe  . Headache(784.0)   . Hepatomegaly     . Hiatal hernia   . Hyperlipidemia   . Hypertension   . Irritable bowel syndrome   . Nausea   . Other chronic nonalcoholic liver disease   . Other specified disorders of liver   . Peripheral neuropathy   . PLMD (periodic limb movement disorder)     SOCIAL HX:  Social History   Tobacco Use  . Smoking status: Never Smoker  . Smokeless tobacco: Never Used  Substance Use Topics  . Alcohol use: No    ALLERGIES:  Allergies  Allergen Reactions  . Bee Venom Anaphylaxis  . Dicyclomine Hcl Anaphylaxis  . Omeprazole Other (See Comments)     GI upset.  . Other Other (See Comments)    Reaction to reductase inhibitors per MAR  . Statins Other (See Comments)    Myalgias      PERTINENT MEDICATIONS:  Outpatient Encounter Medications as of 04/15/2018  Medication Sig  . acetaminophen (TYLENOL) 500 MG tablet Take 500 mg by mouth every 8 (eight) hours as needed (pain).  Marland Kitchen. albuterol (VENTOLIN HFA) 108 (90 BASE) MCG/ACT inhaler Inhale 1 puff into the lungs every 6 (six) hours as needed for wheezing or shortness of breath (nocturnal coughing).  Marland Kitchen. allopurinol (ZYLOPRIM) 100 MG tablet Take 100 mg by mouth daily.  Marland Kitchen. atropine 1 % ophthalmic solution Place 4 drops under the tongue See admin instructions. EVERY 2-3 HOURS AS NEEDED FOR TERMINAL SECRETIONS  . Cholecalciferol (VITAMIN D) 2000 UNITS tablet Take 2,000 Units by mouth daily.  . divalproex (DEPAKOTE SPRINKLE) 125 MG capsule Take 250-500 mg by mouth See admin instructions. 250 mg in the morning and 500 mg  at bedtime  . DULoxetine (CYMBALTA) 60 MG capsule Take 60 mg by mouth daily.  Marland Kitchen. ENSURE PLUS (ENSURE PLUS) LIQD Take 237 mLs by mouth 2 (two) times daily between meals.  . famotidine (PEPCID) 20 MG tablet Take 20 mg by mouth daily.  . fluticasone (FLOVENT HFA) 44 MCG/ACT inhaler Inhale 1 puff into the lungs 2 (two) times daily. Rinse mouth after use  . Ipratropium-Albuterol (COMBIVENT RESPIMAT) 20-100 MCG/ACT AERS respimat Inhale 1 puff into  the lungs every 6 (six) hours.  Marland Kitchen. loperamide (IMODIUM) 2 MG capsule Take 2 mg by mouth as needed for diarrhea or loose stools.  Marland Kitchen. LORazepam (ATIVAN) 0.5 MG tablet Take 0.5 mg by mouth every 8 (eight) hours as needed for anxiety.  Marland Kitchen. losartan (COZAAR) 100 MG tablet Take 0.5 tablets (50 mg total) by mouth daily.  . metoprolol (LOPRESSOR) 50 MG tablet Take 0.5 tablets (25 mg total) by mouth 2 (two) times daily.  Marland Kitchen. morphine (ROXANOL) 20 MG/ML concentrated solution Take 10 mg by mouth every 4 (four) hours as needed (for pain).  Marland Kitchen. oxyCODONE (OXY IR/ROXICODONE) 5 MG immediate release tablet Take 5 mg by mouth every 4 (four) hours as needed for severe pain.  . polyethylene glycol (MIRALAX / GLYCOLAX) packet Take 17 g by mouth daily as needed for mild constipation or moderate constipation.   . Prenatal Vit-Fe Fumarate-FA (MULTIVITAMIN-PRENATAL) 27-0.8 MG TABS tablet Take 1 tablet by mouth daily at 12 noon. For anemia  . promethazine (PHENERGAN) 25 MG suppository Place 25 mg rectally every 6 (six) hours as needed for nausea or vomiting.  . promethazine (PHENERGAN) 25 MG tablet Take 25 mg by mouth every 4 (four) hours as needed for nausea.   Marland Kitchen. QUEtiapine (SEROQUEL) 25 MG tablet Take 25 mg by mouth at bedtime.  . Skin Protectants, Misc. (BAZA PROTECT EX) Apply 1 application topically See admin instructions. TO BOTTOM/BUTTOCKS EVERY SHIFT (8 HRS)   No facility-administered encounter medications on file as of 04/15/2018.     PHYSICAL EXAM:   General: NAD, frail appearing, thin Cardiovascular: regular rate and rhythm Pulmonary: clear ant fields Abdomen: soft, nontender, + bowel sounds GU: no suprapubic tenderness Extremities: no edema, right arm with flexion contracture Skin: no rashes Neurological: Weakness but otherwise nonfocal  Mahima Hottle G [place], NP

## 2018-04-16 ENCOUNTER — Non-Acute Institutional Stay: Payer: Medicare Other | Admitting: Nurse Practitioner

## 2018-04-16 DIAGNOSIS — R269 Unspecified abnormalities of gait and mobility: Secondary | ICD-10-CM

## 2018-04-16 DIAGNOSIS — Z515 Encounter for palliative care: Secondary | ICD-10-CM

## 2018-04-16 DIAGNOSIS — F319 Bipolar disorder, unspecified: Secondary | ICD-10-CM

## 2018-04-16 DIAGNOSIS — F0390 Unspecified dementia without behavioral disturbance: Secondary | ICD-10-CM

## 2018-04-17 ENCOUNTER — Encounter: Payer: Self-pay | Admitting: Nurse Practitioner

## 2018-04-17 NOTE — Progress Notes (Signed)
PALLIATIVE CARE CONSULT VISIT   PATIENT NAME: Deborah RocheJoanne W [name] DOB: [birth year] MRN: [id number]  PRIMARY CARE PROVIDER:   Agapito GamesMetheney, Catherine D, MD  REFERRING PROVIDER:  Agapito GamesMetheney, Catherine D, MD [phone] [address] [place], [place] [address]  RESPONSIBLE PARTY:   Mickeal SkinnerDavid Fiallo (son) [phone] [facility](HM) [phone] (Mobile  ASSESSMENT/RECOMMENDATIONS and PLAN:  Dementia w/o behavioral disturbance -FAST -depression -anxiety -bipolar d/o -gait d/o -oriented to name only; thought process disorganized  and circumstantial -no behavioral issues per staff -cymbalta/lorazepam -depakote -quetiapine -uses wheelchair/no recent falls per staff -per staff appetite is good -continue ensure  Chronic medical problems Iron def anemia GERD -gout -HTN -managed by primary care physician -pre-natal vitamin -famotidine -allopurinol -losartan -metoprolol  ACP -DNR    I spent 15 minutes providing this consultation,  from 10:00 to 10:15. More than 50% of the time in this consultation was spent coordinating communication.   HISTORY OF PRESENT ILLNESS:  Deborah [name] is a 72 y.o. year old female with multiple medical problems including dementia, bipolar d/o,gait disturbance, FE def anemia,. Palliative Care was asked to assist with symptom management, continue ongoing patient and family support.   CODE STATUS: DNR  PPS: 30% HOSPICE ELIGIBILITY/DIAGNOSIS: TBD  PAST MEDICAL HISTORY:  Past Medical History:  Diagnosis Date  . Abscess of face 05/06/2014  . Bipolar disorder (HCC)    Saul FordyceKelly Virgil, NP  . Cardiomegaly   . Chronic headache   . COPD (chronic obstructive pulmonary disease) (HCC)   . Dementia   . Depression   . Diverticulosis of colon (without mention of hemorrhage)   . Esophageal reflux   . Family history of colon cancer   . Gout flare    Left toe  . Headache(784.0)   . Hepatomegaly   . Hiatal hernia   . Hyperlipidemia   . Hypertension   . Irritable bowel  syndrome   . Nausea   . Other chronic nonalcoholic liver disease   . Other specified disorders of liver   . Peripheral neuropathy   . PLMD (periodic limb movement disorder)     SOCIAL HX:  Social History   Tobacco Use  . Smoking status: Never Smoker  . Smokeless tobacco: Never Used  Substance Use Topics  . Alcohol use: No    ALLERGIES:  Allergies  Allergen Reactions  . Bee Venom Anaphylaxis  . Dicyclomine Hcl Anaphylaxis  . Omeprazole Other (See Comments)     GI upset.  . Other Other (See Comments)    Reaction to reductase inhibitors per MAR  . Statins Other (See Comments)    Myalgias      PERTINENT MEDICATIONS:  Outpatient Encounter Medications as of 04/16/2018  Medication Sig  . acetaminophen (TYLENOL) 500 MG tablet Take 500 mg by mouth every 8 (eight) hours as needed (pain).  Marland Kitchen. albuterol (VENTOLIN HFA) 108 (90 BASE) MCG/ACT inhaler Inhale 1 puff into the lungs every 6 (six) hours as needed for wheezing or shortness of breath (nocturnal coughing).  Marland Kitchen. allopurinol (ZYLOPRIM) 100 MG tablet Take 100 mg by mouth daily.  Marland Kitchen. atropine 1 % ophthalmic solution Place 4 drops under the tongue See admin instructions. EVERY 2-3 HOURS AS NEEDED FOR TERMINAL SECRETIONS  . Cholecalciferol (VITAMIN D) 2000 UNITS tablet Take 2,000 Units by mouth daily.  . divalproex (DEPAKOTE SPRINKLE) 125 MG capsule Take 250-500 mg by mouth See admin instructions. 250 mg in the morning and 500 mg at bedtime  . DULoxetine (CYMBALTA) 60 MG capsule Take 60 mg  by mouth daily.  Marland Kitchen. ENSURE PLUS (ENSURE PLUS) LIQD Take 237 mLs by mouth 2 (two) times daily between meals.  . famotidine (PEPCID) 20 MG tablet Take 20 mg by mouth daily.  . fluticasone (FLOVENT HFA) 44 MCG/ACT inhaler Inhale 1 puff into the lungs 2 (two) times daily. Rinse mouth after use  . Ipratropium-Albuterol (COMBIVENT RESPIMAT) 20-100 MCG/ACT AERS respimat Inhale 1 puff into the lungs every 6 (six) hours.  Marland Kitchen. loperamide (IMODIUM) 2 MG capsule Take 2  mg by mouth as needed for diarrhea or loose stools.  Marland Kitchen. LORazepam (ATIVAN) 0.5 MG tablet Take 0.5 mg by mouth every 8 (eight) hours as needed for anxiety.  Marland Kitchen. losartan (COZAAR) 100 MG tablet Take 0.5 tablets (50 mg total) by mouth daily.  . metoprolol (LOPRESSOR) 50 MG tablet Take 0.5 tablets (25 mg total) by mouth 2 (two) times daily.  Marland Kitchen. morphine (ROXANOL) 20 MG/ML concentrated solution Take 10 mg by mouth every 4 (four) hours as needed (for pain).  Marland Kitchen. oxyCODONE (OXY IR/ROXICODONE) 5 MG immediate release tablet Take 5 mg by mouth every 4 (four) hours as needed for severe pain.  . polyethylene glycol (MIRALAX / GLYCOLAX) packet Take 17 g by mouth daily as needed for mild constipation or moderate constipation.   . Prenatal Vit-Fe Fumarate-FA (MULTIVITAMIN-PRENATAL) 27-0.8 MG TABS tablet Take 1 tablet by mouth daily at 12 noon. For anemia  . promethazine (PHENERGAN) 25 MG suppository Place 25 mg rectally every 6 (six) hours as needed for nausea or vomiting.  . promethazine (PHENERGAN) 25 MG tablet Take 25 mg by mouth every 4 (four) hours as needed for nausea.   Marland Kitchen. QUEtiapine (SEROQUEL) 25 MG tablet Take 25 mg by mouth at bedtime.  . Skin Protectants, Misc. (BAZA PROTECT EX) Apply 1 application topically See admin instructions. TO BOTTOM/BUTTOCKS EVERY SHIFT (8 HRS)   No facility-administered encounter medications on file as of 04/16/2018.     PHYSICAL EXAM:   General: NAD, kyphotic sitting in wheelchair Cardiovascular: regular rate and rhythm Pulmonary: clear ant fields Abdomen: soft, nontender, + bowel sounds GU: no suprapubic tenderness Extremities: no edema, no joint deformities Skin: no rashes Neurological: Weakness but otherwise nonfocal  Stephanie G SwazilandJordan, NP

## 2018-04-20 ENCOUNTER — Encounter: Payer: Self-pay | Admitting: Nurse Practitioner

## 2018-05-30 NOTE — Progress Notes (Signed)
This is a blank note, please discard

## 2018-08-06 ENCOUNTER — Encounter (HOSPITAL_COMMUNITY): Payer: Self-pay | Admitting: Emergency Medicine

## 2018-08-06 ENCOUNTER — Emergency Department (HOSPITAL_COMMUNITY): Payer: Medicare Other

## 2018-08-06 ENCOUNTER — Inpatient Hospital Stay (HOSPITAL_COMMUNITY)
Admission: EM | Admit: 2018-08-06 | Discharge: 2018-08-14 | DRG: 871 | Disposition: A | Discharge status: Skilled Nursing Facility | Payer: Medicare Other | Attending: Internal Medicine | Admitting: Internal Medicine

## 2018-08-06 DIAGNOSIS — A4151 Sepsis due to Escherichia coli [E. coli]: Principal | ICD-10-CM | POA: Diagnosis present

## 2018-08-06 DIAGNOSIS — Z818 Family history of other mental and behavioral disorders: Secondary | ICD-10-CM

## 2018-08-06 DIAGNOSIS — G4761 Periodic limb movement disorder: Secondary | ICD-10-CM | POA: Diagnosis present

## 2018-08-06 DIAGNOSIS — Z66 Do not resuscitate: Secondary | ICD-10-CM | POA: Diagnosis present

## 2018-08-06 DIAGNOSIS — N390 Urinary tract infection, site not specified: Secondary | ICD-10-CM | POA: Diagnosis present

## 2018-08-06 DIAGNOSIS — Z7189 Other specified counseling: Secondary | ICD-10-CM | POA: Diagnosis not present

## 2018-08-06 DIAGNOSIS — Z79899 Other long term (current) drug therapy: Secondary | ICD-10-CM

## 2018-08-06 DIAGNOSIS — Z9981 Dependence on supplemental oxygen: Secondary | ICD-10-CM | POA: Diagnosis not present

## 2018-08-06 DIAGNOSIS — K219 Gastro-esophageal reflux disease without esophagitis: Secondary | ICD-10-CM | POA: Diagnosis present

## 2018-08-06 DIAGNOSIS — E870 Hyperosmolality and hypernatremia: Secondary | ICD-10-CM | POA: Diagnosis present

## 2018-08-06 DIAGNOSIS — F418 Other specified anxiety disorders: Secondary | ICD-10-CM | POA: Diagnosis present

## 2018-08-06 DIAGNOSIS — I619 Nontraumatic intracerebral hemorrhage, unspecified: Secondary | ICD-10-CM | POA: Diagnosis not present

## 2018-08-06 DIAGNOSIS — Z87440 Personal history of urinary (tract) infections: Secondary | ICD-10-CM

## 2018-08-06 DIAGNOSIS — I10 Essential (primary) hypertension: Secondary | ICD-10-CM | POA: Diagnosis present

## 2018-08-06 DIAGNOSIS — E43 Unspecified severe protein-calorie malnutrition: Secondary | ICD-10-CM | POA: Diagnosis present

## 2018-08-06 DIAGNOSIS — F0150 Vascular dementia without behavioral disturbance: Secondary | ICD-10-CM | POA: Diagnosis not present

## 2018-08-06 DIAGNOSIS — Z820 Family history of epilepsy and other diseases of the nervous system: Secondary | ICD-10-CM

## 2018-08-06 DIAGNOSIS — F319 Bipolar disorder, unspecified: Secondary | ICD-10-CM | POA: Diagnosis present

## 2018-08-06 DIAGNOSIS — Z800 Family history of malignant neoplasm of digestive organs: Secondary | ICD-10-CM

## 2018-08-06 DIAGNOSIS — R4182 Altered mental status, unspecified: Secondary | ICD-10-CM | POA: Diagnosis present

## 2018-08-06 DIAGNOSIS — N179 Acute kidney failure, unspecified: Secondary | ICD-10-CM | POA: Diagnosis present

## 2018-08-06 DIAGNOSIS — K7581 Nonalcoholic steatohepatitis (NASH): Secondary | ICD-10-CM | POA: Diagnosis present

## 2018-08-06 DIAGNOSIS — J449 Chronic obstructive pulmonary disease, unspecified: Secondary | ICD-10-CM | POA: Diagnosis present

## 2018-08-06 DIAGNOSIS — R6520 Severe sepsis without septic shock: Secondary | ICD-10-CM | POA: Diagnosis not present

## 2018-08-06 DIAGNOSIS — F0390 Unspecified dementia without behavioral disturbance: Secondary | ICD-10-CM | POA: Diagnosis present

## 2018-08-06 DIAGNOSIS — K589 Irritable bowel syndrome without diarrhea: Secondary | ICD-10-CM | POA: Diagnosis present

## 2018-08-06 DIAGNOSIS — Z515 Encounter for palliative care: Secondary | ICD-10-CM

## 2018-08-06 DIAGNOSIS — G92 Toxic encephalopathy: Secondary | ICD-10-CM | POA: Diagnosis present

## 2018-08-06 DIAGNOSIS — Z7951 Long term (current) use of inhaled steroids: Secondary | ICD-10-CM

## 2018-08-06 DIAGNOSIS — G9340 Encephalopathy, unspecified: Secondary | ICD-10-CM | POA: Diagnosis present

## 2018-08-06 DIAGNOSIS — Z825 Family history of asthma and other chronic lower respiratory diseases: Secondary | ICD-10-CM

## 2018-08-06 DIAGNOSIS — I491 Atrial premature depolarization: Secondary | ICD-10-CM | POA: Diagnosis present

## 2018-08-06 DIAGNOSIS — R1310 Dysphagia, unspecified: Secondary | ICD-10-CM | POA: Diagnosis present

## 2018-08-06 DIAGNOSIS — Z888 Allergy status to other drugs, medicaments and biological substances status: Secondary | ICD-10-CM

## 2018-08-06 DIAGNOSIS — Z6824 Body mass index (BMI) 24.0-24.9, adult: Secondary | ICD-10-CM

## 2018-08-06 DIAGNOSIS — E860 Dehydration: Secondary | ICD-10-CM | POA: Diagnosis not present

## 2018-08-06 DIAGNOSIS — K5730 Diverticulosis of large intestine without perforation or abscess without bleeding: Secondary | ICD-10-CM | POA: Diagnosis present

## 2018-08-06 DIAGNOSIS — J9690 Respiratory failure, unspecified, unspecified whether with hypoxia or hypercapnia: Secondary | ICD-10-CM | POA: Diagnosis present

## 2018-08-06 DIAGNOSIS — Z90711 Acquired absence of uterus with remaining cervical stump: Secondary | ICD-10-CM

## 2018-08-06 DIAGNOSIS — G629 Polyneuropathy, unspecified: Secondary | ICD-10-CM | POA: Diagnosis present

## 2018-08-06 DIAGNOSIS — Z9049 Acquired absence of other specified parts of digestive tract: Secondary | ICD-10-CM

## 2018-08-06 DIAGNOSIS — Z91030 Bee allergy status: Secondary | ICD-10-CM

## 2018-08-06 DIAGNOSIS — E785 Hyperlipidemia, unspecified: Secondary | ICD-10-CM | POA: Diagnosis present

## 2018-08-06 DIAGNOSIS — F03B0 Unspecified dementia, moderate, without behavioral disturbance, psychotic disturbance, mood disturbance, and anxiety: Secondary | ICD-10-CM | POA: Diagnosis present

## 2018-08-06 DIAGNOSIS — Z8249 Family history of ischemic heart disease and other diseases of the circulatory system: Secondary | ICD-10-CM

## 2018-08-06 DIAGNOSIS — Z8673 Personal history of transient ischemic attack (TIA), and cerebral infarction without residual deficits: Secondary | ICD-10-CM

## 2018-08-06 DIAGNOSIS — A419 Sepsis, unspecified organism: Secondary | ICD-10-CM | POA: Diagnosis not present

## 2018-08-06 LAB — CBC WITH DIFFERENTIAL/PLATELET
ABS IMMATURE GRANULOCYTES: 0.07 10*3/uL (ref 0.00–0.07)
BASOS ABS: 0.1 10*3/uL (ref 0.0–0.1)
Basophils Relative: 0 %
EOS ABS: 0 10*3/uL (ref 0.0–0.5)
Eosinophils Relative: 0 %
HEMATOCRIT: 56.8 % — AB (ref 36.0–46.0)
Hemoglobin: 17.3 g/dL — ABNORMAL HIGH (ref 12.0–15.0)
IMMATURE GRANULOCYTES: 1 %
LYMPHS ABS: 2.4 10*3/uL (ref 0.7–4.0)
Lymphocytes Relative: 16 %
MCH: 29.4 pg (ref 26.0–34.0)
MCHC: 30.5 g/dL (ref 30.0–36.0)
MCV: 96.4 fL (ref 80.0–100.0)
Monocytes Absolute: 1.5 10*3/uL — ABNORMAL HIGH (ref 0.1–1.0)
Monocytes Relative: 10 %
NEUTROS ABS: 10.7 10*3/uL — AB (ref 1.7–7.7)
NEUTROS PCT: 73 %
NRBC: 0 % (ref 0.0–0.2)
PLATELETS: 351 10*3/uL (ref 150–400)
RBC: 5.89 MIL/uL — ABNORMAL HIGH (ref 3.87–5.11)
RDW: 16.3 % — AB (ref 11.5–15.5)
WBC: 14.7 10*3/uL — ABNORMAL HIGH (ref 4.0–10.5)

## 2018-08-06 LAB — COMPREHENSIVE METABOLIC PANEL
ALBUMIN: 3.5 g/dL (ref 3.5–5.0)
ALK PHOS: 42 U/L (ref 38–126)
ALT: 39 U/L (ref 0–44)
AST: 67 U/L — ABNORMAL HIGH (ref 15–41)
Anion gap: 14 (ref 5–15)
BUN: 43 mg/dL — ABNORMAL HIGH (ref 8–23)
CALCIUM: 9 mg/dL (ref 8.9–10.3)
CHLORIDE: 115 mmol/L — AB (ref 98–111)
CO2: 25 mmol/L (ref 22–32)
Creatinine, Ser: 1.91 mg/dL — ABNORMAL HIGH (ref 0.44–1.00)
GFR calc non Af Amer: 26 mL/min — ABNORMAL LOW (ref >60)
GFR, EST AFRICAN AMERICAN: 30 mL/min — AB (ref >60)
Glucose, Bld: 115 mg/dL — ABNORMAL HIGH (ref 70–99)
Potassium: 5.5 mmol/L — ABNORMAL HIGH (ref 3.5–5.1)
SODIUM: 154 mmol/L — AB (ref 135–145)
Total Bilirubin: 2.1 mg/dL — ABNORMAL HIGH (ref 0.3–1.2)
Total Protein: 6.8 g/dL (ref 6.5–8.1)

## 2018-08-06 LAB — CREATININE, SERUM
Creatinine, Ser: 1.3 mg/dL — ABNORMAL HIGH (ref 0.44–1.00)
GFR calc Af Amer: 47 mL/min — ABNORMAL LOW (ref >60)
GFR calc non Af Amer: 41 mL/min — ABNORMAL LOW (ref >60)

## 2018-08-06 LAB — URINALYSIS, ROUTINE W REFLEX MICROSCOPIC
Glucose, UA: NEGATIVE mg/dL
Ketones, ur: NEGATIVE mg/dL
LEUKOCYTES UA: NEGATIVE
Nitrite: NEGATIVE
PROTEIN: NEGATIVE mg/dL
Specific Gravity, Urine: 1.026 (ref 1.005–1.030)
pH: 5 (ref 5.0–8.0)

## 2018-08-06 LAB — CBC
HCT: 48.4 % — ABNORMAL HIGH (ref 36.0–46.0)
Hemoglobin: 14.1 g/dL (ref 12.0–15.0)
MCH: 28.7 pg (ref 26.0–34.0)
MCHC: 29.1 g/dL — ABNORMAL LOW (ref 30.0–36.0)
MCV: 98.4 fL (ref 80.0–100.0)
NRBC: 0 % (ref 0.0–0.2)
Platelets: 243 10*3/uL (ref 150–400)
RBC: 4.92 MIL/uL (ref 3.87–5.11)
RDW: 15.7 % — ABNORMAL HIGH (ref 11.5–15.5)
WBC: 11.5 10*3/uL — ABNORMAL HIGH (ref 4.0–10.5)

## 2018-08-06 LAB — I-STAT CG4 LACTIC ACID, ED
LACTIC ACID, VENOUS: 3.43 mmol/L — AB (ref 0.5–1.9)
Lactic Acid, Venous: 2.04 mmol/L (ref 0.5–1.9)

## 2018-08-06 LAB — CBG MONITORING, ED: Glucose-Capillary: 94 mg/dL (ref 70–99)

## 2018-08-06 LAB — VALPROIC ACID LEVEL: Valproic Acid Lvl: 14 ug/mL — ABNORMAL LOW (ref 50.0–100.0)

## 2018-08-06 LAB — LACTIC ACID, PLASMA
Lactic Acid, Venous: 1.9 mmol/L (ref 0.5–1.9)
Lactic Acid, Venous: 1.8 mmol/L (ref 0.5–1.9)

## 2018-08-06 MED ORDER — ALBUTEROL SULFATE (2.5 MG/3ML) 0.083% IN NEBU: 2.5000 mg | INHALATION_SOLUTION | Freq: Four times a day (QID) | RESPIRATORY_TRACT | Status: DC | PRN

## 2018-08-06 MED ORDER — DIVALPROEX SODIUM 125 MG PO CSDR: 250.0000 mg | DELAYED_RELEASE_CAPSULE | ORAL | Status: DC

## 2018-08-06 MED ORDER — METRONIDAZOLE IN NACL 5-0.79 MG/ML-% IV SOLN: 500.0000 mg | Freq: Three times a day (TID) | INTRAVENOUS | Status: DC

## 2018-08-06 MED ORDER — VANCOMYCIN HCL IN DEXTROSE 1-5 GM/200ML-% IV SOLN: 1000.0000 mg | Freq: Once | INTRAVENOUS | Status: DC

## 2018-08-06 MED ORDER — VANCOMYCIN HCL 10 G IV SOLR: 1250.0000 mg | INTRAVENOUS | Status: DC

## 2018-08-06 MED ORDER — CEFEPIME HCL 2 G IJ SOLR
2.0000 g | Freq: Once | INTRAMUSCULAR | Status: AC
  Administered 2018-08-06: 2 g via INTRAVENOUS
  Filled 2018-08-06: qty 2

## 2018-08-06 MED ORDER — HEPARIN SODIUM (PORCINE) 5000 UNIT/ML IJ SOLN
5000.0000 [IU] | Freq: Three times a day (TID) | INTRAMUSCULAR | Status: DC
  Administered 2018-08-06 – 2018-08-10 (×11): 5000 [IU] via SUBCUTANEOUS
  Filled 2018-08-06 (×11): qty 1

## 2018-08-06 MED ORDER — BUDESONIDE 0.25 MG/2ML IN SUSP
0.2500 mg | Freq: Two times a day (BID) | RESPIRATORY_TRACT | Status: DC
  Administered 2018-08-07 – 2018-08-14 (×15): 0.25 mg via RESPIRATORY_TRACT
  Filled 2018-08-06 (×18): qty 2

## 2018-08-06 MED ORDER — VANCOMYCIN HCL 10 G IV SOLR
1750.0000 mg | Freq: Once | INTRAVENOUS | Status: AC
  Administered 2018-08-06: 1750 mg via INTRAVENOUS
  Filled 2018-08-06: qty 1750

## 2018-08-06 MED ORDER — METRONIDAZOLE IN NACL 5-0.79 MG/ML-% IV SOLN
500.0000 mg | Freq: Three times a day (TID) | INTRAVENOUS | Status: DC
  Administered 2018-08-06 – 2018-08-07 (×3): 500 mg via INTRAVENOUS
  Filled 2018-08-06 (×3): qty 100

## 2018-08-06 MED ORDER — SODIUM CHLORIDE 0.9 % IV BOLUS
1000.0000 mL | Freq: Once | INTRAVENOUS | Status: AC
  Administered 2018-08-06: 1000 mL via INTRAVENOUS

## 2018-08-06 MED ORDER — FLUTICASONE PROPIONATE HFA 44 MCG/ACT IN AERO: 1.0000 | INHALATION_SPRAY | Freq: Two times a day (BID) | RESPIRATORY_TRACT | Status: DC

## 2018-08-06 MED ORDER — LACTATED RINGERS IV SOLN
INTRAVENOUS | Status: DC
  Administered 2018-08-06: 16:00:00 via INTRAVENOUS

## 2018-08-06 MED ORDER — ACETAMINOPHEN 325 MG PO TABS
650.0000 mg | ORAL_TABLET | Freq: Four times a day (QID) | ORAL | Status: DC | PRN
  Filled 2018-08-06: qty 2

## 2018-08-06 MED ORDER — DIVALPROEX SODIUM 125 MG PO CSDR
250.0000 mg | DELAYED_RELEASE_CAPSULE | Freq: Every day | ORAL | Status: DC
  Filled 2018-08-06: qty 2

## 2018-08-06 MED ORDER — ACETAMINOPHEN 650 MG RE SUPP
650.0000 mg | Freq: Four times a day (QID) | RECTAL | Status: DC | PRN
  Administered 2018-08-14: 650 mg via RECTAL
  Filled 2018-08-06: qty 1

## 2018-08-06 MED ORDER — IPRATROPIUM-ALBUTEROL 0.5-2.5 (3) MG/3ML IN SOLN: 3.0000 mL | RESPIRATORY_TRACT | Status: DC | PRN

## 2018-08-06 MED ORDER — DIVALPROEX SODIUM 125 MG PO CSDR
500.0000 mg | DELAYED_RELEASE_CAPSULE | Freq: Every day | ORAL | Status: DC
  Filled 2018-08-06: qty 4

## 2018-08-06 MED ORDER — SODIUM CHLORIDE 0.9 % IV SOLN
1.0000 g | INTRAVENOUS | Status: DC
  Administered 2018-08-07: 1 g via INTRAVENOUS
  Filled 2018-08-06 (×2): qty 1

## 2018-08-06 MED ORDER — SODIUM CHLORIDE 0.9 % IV SOLN: 2.0000 g | INTRAVENOUS | Status: DC

## 2018-08-06 MED ORDER — ACETAMINOPHEN 650 MG RE SUPP
650.0000 mg | Freq: Once | RECTAL | Status: AC
  Administered 2018-08-06: 650 mg via RECTAL
  Filled 2018-08-06: qty 1

## 2018-08-06 MED ORDER — PANTOPRAZOLE SODIUM 40 MG PO TBEC
40.0000 mg | DELAYED_RELEASE_TABLET | Freq: Every day | ORAL | Status: DC
  Filled 2018-08-06: qty 1

## 2018-08-06 NOTE — ED Triage Notes (Signed)
Pt coming from [facility] care unit via EMS. Pt recently treated for UTI. Pt has dementia, but is usually alert. Mental status decline since yesterday. Temp 102.7 axillary per EMS, minimally responsive to pain, protecting airway, resp 24, BP 104/60, HR 110-120. EMS gave 250mL NS. Pt has DNR form present and is a patient with hospice care [phone]

## 2018-08-06 NOTE — Progress Notes (Signed)
[facility] hospital liaison note.  Notified that patient was brought to ED for evaluation and treatment. This patient is a new referral to [facility]. Patient has not been seen by hospice staff prior to this ED visit.  She was previously under [facility] Palliative [facility] program.  [facility] will continue to follow until patient is discharged and anticipate discharge needs to initiate hospice services.  Please call with any hospice related questions or concerns.  Thank you,  Elsie SaasMary Anne Robertson, RN, [facility] Hospital Liaison (listed on [facility]) [phone]

## 2018-08-06 NOTE — ED Provider Notes (Signed)
[facility] EMERGENCY DEPARTMENT Provider Note   CSN: [id number] Arrival date & time: 08/06/18  1114   LEVEL 5 CAVEAT - DEMENTIA   History   Chief Complaint Chief Complaint  Patient presents with  . Altered Mental Status    HPI Deborah [name] is a 72 y.o. female.  HPI  72 year old female presents with altered mental state.  History is taken from EMS as no family is present and the patient is altered.  She has been having progressive worsening mental status from her dementia baseline over the last couple days but today this is essentially unresponsive.  EMS states her teeth are clamped down and she seems to be clenched in her extremities.  She will not follow commands.  She was found to be febrile to 102 at the nursing home.  Recently came off antibiotics for a UTI.  Other history is not available.  Past Medical History:  Diagnosis Date  . Abscess of face 05/06/2014  . Bipolar disorder (HCC)    [name], NP  . Cardiomegaly   . Chronic headache   . COPD (chronic obstructive pulmonary disease) (HCC)   . Dementia (HCC)   . Depression   . Diverticulosis of colon (without mention of hemorrhage)   . Esophageal reflux   . Family history of colon cancer   . Gout flare    Left toe  . Headache(784.0)   . Hepatomegaly   . Hiatal hernia   . Hyperlipidemia   . Hypertension   . Irritable bowel syndrome   . Nausea   . Other chronic nonalcoholic liver disease   . Other specified disorders of liver   . Peripheral neuropathy   . PLMD (periodic limb movement disorder)     Patient Active Problem List   Diagnosis Date Noted  . Dehydration 08/06/2018  . Severe sepsis (HCC) 08/06/2018  . Sepsis (HCC) 08/06/2018  . Acute blood loss anemia 12/05/2016  . Hypertension   . Hyperlipidemia   . Dementia (HCC)   . COPD (chronic obstructive pulmonary disease) (HCC)   . Acute encephalopathy   . Hypokalemia   . HCAP (healthcare-associated pneumonia) 03/31/2015  . HLD  (hyperlipidemia) 05/18/2014  . Stroke due to intracerebral hemorrhage (HCC) 05/14/2014  . Stroke (HCC) 05/14/2014  . Stress incontinence 04/20/2013  . Moderate dementia (HCC) 04/20/2013  . PLMD (periodic limb movement disorder) 03/01/2013  . Hypoxia, sleep related 07/05/2011  . ACQUIRED HEMOLYTIC ANEMIA UNSPECIFIED 08/08/2010  . HYPERGLYCEMIA 06/01/2010  . HYPONATREMIA 04/11/2010  . HYPERLIPIDEMIA 03/24/2010  . PULMONARY NODULE 11/24/2009  . FATTY LIVER DISEASE 03/01/2009  . HEPATOMEGALY 03/01/2009  . 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 ASTHMA 09/17/2008  . 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 HYPERCHOLESTEROLEMIA 10/28/2007  . Bipolar disorder (HCC) 10/28/2007  . Essential hypertension 10/28/2007  . CARDIOMEGALY 10/28/2007  . ALLERGIC RHINITIS CAUSE UNSPECIFIED 10/28/2007  . COPD 10/28/2007  . HIATAL HERNIA 10/28/2007  . DIVERTICULAR DISEASE 10/28/2007  . Irritable bowel syndrome 10/28/2007  . HEPATIC CYST 10/28/2007  . DEGENERATIVE JOINT DISEASE 10/28/2007  . HEADACHE, CHRONIC 10/28/2007  . ARRHYTHMIA, HX OF 10/28/2007    Past Surgical History:  Procedure Laterality Date  . ABDOMINAL HYSTERECTOMY     partial  . APPENDECTOMY    . CHOLECYSTECTOMY    . OVARIAN CYST REMOVAL    . removal of cyst on chest wall       OB History   None      Home Medications    Prior to Admission  medications   Medication Sig Start Date End Date Taking? Authorizing Provider  albuterol (VENTOLIN HFA) 108 (90 BASE) MCG/ACT inhaler Inhale 1 puff into the lungs every 6 (six) hours as needed for wheezing or shortness of breath (nocturnal coughing).   Yes [provider]  allopurinol (ZYLOPRIM) 100 MG tablet Take 100 mg by mouth daily.   Yes [provider]  divalproex (DEPAKOTE SPRINKLE) 125 MG capsule Take 250-500 mg by mouth See admin instructions. 250 mg in the morning and 500 mg at bedtime   Yes [provider]  DULoxetine (CYMBALTA) 60 MG capsule Take 60 mg  by mouth daily.   Yes [provider]  fluticasone (FLOVENT HFA) 44 MCG/ACT inhaler Inhale 1 puff into the lungs 2 (two) times daily. Rinse mouth after use   Yes [provider]  Ipratropium-Albuterol (COMBIVENT RESPIMAT) 20-100 MCG/ACT AERS respimat Inhale 1 puff into the lungs every 6 (six) hours.   Yes [provider]  losartan (COZAAR) 100 MG tablet Take 0.5 tablets (50 mg total) by mouth daily. 04/07/15  Yes Jeralyn Bennett, MD  OXYGEN Inhale 2 L/min into the lungs as needed (o2).   Yes [provider]  pantoprazole (PROTONIX) 40 MG tablet Take 40 mg by mouth daily.   Yes [provider]  Prenatal Vit-Fe Fumarate-FA (MULTIVITAMIN-PRENATAL) 27-0.8 MG TABS tablet Take 1 tablet by mouth daily at 12 noon. For anemia   Yes [provider]  promethazine (PHENERGAN) 25 MG tablet Take 25 mg by mouth every 4 (four) hours as needed for nausea.    Yes [provider]  Skin Protectants, Misc. (BAZA PROTECT EX) Apply 1 application topically See admin instructions. TO BOTTOM/BUTTOCKS EVERY SHIFT (8 HRS)   Yes [provider]  cephALEXin (KEFLEX) 500 MG capsule Take 500 mg by mouth 2 (two) times daily. x7 days.    [provider]  metoprolol (LOPRESSOR) 50 MG tablet Take 0.5 tablets (25 mg total) by mouth 2 (two) times daily. Patient not taking: Reported on 08/06/2018 12/07/16   Pearson Grippe, MD    Family History Family History  Problem Relation Age of Onset  . Emphysema Mother   . Heart disease Father   . Mental illness Father        Bipolar disorder  . Alzheimer's disease Brother   . Colon cancer Maternal Grandfather   . Bipolar disorder Brother     Social History Social History   Tobacco Use  . Smoking status: Never Smoker  . Smokeless tobacco: Never Used  Substance Use Topics  . Alcohol use: No  . Drug use: No     Allergies   Bee venom; Dicyclomine hcl; Omeprazole; Other; and Statins   Review of  Systems Review of Systems  Unable to perform ROS: Dementia     Physical Exam Updated Vital Signs BP 118/78   Pulse 94   Temp 99.3 F (37.4 C) (Axillary)   Resp 19   Ht 5\' 4"  (1.626 m)   Wt 77.1 kg   SpO2 99%   BMI 29.18 kg/m   Physical Exam  Constitutional: She appears well-developed and well-nourished.  HENT:  Head: Normocephalic and atraumatic.  Right Ear: External ear normal.  Left Ear: External ear normal.  Nose: Nose normal.  Eyes: Right eye exhibits no discharge. Left eye exhibits no discharge.  Cardiovascular: Regular rhythm and normal heart sounds. Tachycardia present.  Pulmonary/Chest: Breath sounds normal. Tachypnea noted.  Abdominal: Soft. She exhibits no distension. There is no tenderness.  Neurological: She is alert.  Patient is alert.  Her eyes are open or slightly shot.  When I try to open them manually she tries to close them.  She resist movements of her extremities.  Skin: Skin is warm and dry.  Psychiatric: Her mood appears not anxious.  Nursing note and vitals reviewed.    ED Treatments / Results  Labs (all labs ordered are listed, but only abnormal results are displayed) Labs Reviewed  COMPREHENSIVE METABOLIC PANEL - Abnormal; Notable for the following components:      Result Value   Sodium 154 (*)    Potassium 5.5 (*)    Chloride 115 (*)    Glucose, Bld 115 (*)    BUN 43 (*)    Creatinine, Ser 1.91 (*)    AST 67 (*)    Total Bilirubin 2.1 (*)    GFR calc non Af Amer 26 (*)    GFR calc Af Amer 30 (*)    All other components within normal limits  CBC WITH DIFFERENTIAL/PLATELET - Abnormal; Notable for the following components:   WBC 14.7 (*)    RBC 5.89 (*)    Hemoglobin 17.3 (*)    HCT 56.8 (*)    RDW 16.3 (*)    Neutro Abs 10.7 (*)    Monocytes Absolute 1.5 (*)    All other components within normal limits  URINALYSIS, ROUTINE W REFLEX MICROSCOPIC - Abnormal; Notable for the following components:   Color, Urine AMBER (*)     APPearance HAZY (*)    Hgb urine dipstick SMALL (*)    Bilirubin Urine SMALL (*)    Bacteria, UA MANY (*)    All other components within normal limits  I-STAT CG4 LACTIC ACID, ED - Abnormal; Notable for the following components:   Lactic Acid, Venous 3.43 (*)    All other components within normal limits  CULTURE, BLOOD (ROUTINE X 2)  CULTURE, BLOOD (ROUTINE X 2)  URINE CULTURE  VALPROIC ACID LEVEL  LACTIC ACID, PLASMA  LACTIC ACID, PLASMA  CBC  CREATININE, SERUM  CBG MONITORING, ED  I-STAT CG4 LACTIC ACID, ED    EKG EKG Interpretation  Date/Time:  Thursday August 06 2018 11:24:30 EST Ventricular Rate:  117 PR Interval:    QRS Duration: 78 QT Interval:  320 QTC Calculation: 447 R Axis:   51 Text Interpretation:  Sinus tachycardia Atrial premature complex Probable left atrial enlargement rate faster compared to 2017 Artifact Confirmed by Pricilla Loveless [phone]) on 08/06/2018 12:22:01 PM   Radiology Ct Head Wo Contrast  Result Date: 08/06/2018 CLINICAL DATA:  Altered level of consciousness EXAM: CT HEAD WITHOUT CONTRAST TECHNIQUE: Contiguous axial images were obtained from the base of the skull through the vertex without intravenous contrast. COMPARISON:  01/31/2016 FINDINGS: Brain: Chronic atrophic changes as well as chronic white matter ischemic changes are seen. No findings to suggest acute hemorrhage, acute infarction or space-occupying mass lesion are noted. Stable encephalomalacia near the vertex is noted on the right. Vascular: No hyperdense vessel or unexpected calcification. Skull: Normal. Negative for fracture or focal lesion. Sinuses/Orbits: No acute finding. Stable chronic opacification of the right maxillary antrum is seen. Other: None. IMPRESSION: Chronic atrophic and ischemic changes without acute intracranial abnormality. Chronic opacification of the right maxillary antrum. Electronically Signed   By: Alcide Clever M.D.   On: 08/06/2018 13:35   Dg Chest Portable 1  View  Result Date: 08/06/2018 CLINICAL DATA:  Altered mental status beginning yesterday. EXAM: PORTABLE CHEST 1  VIEW COMPARISON:  Single-view of the chest 12/05/2016 and 01/31/2016. FINDINGS: Lungs are clear. Heart size is upper normal. No pneumothorax or pleural fluid. No acute or focal bony abnormality. IMPRESSION: No acute disease. Electronically Signed   By: Drusilla Kanner M.D.   On: 08/06/2018 12:32    Procedures .Critical Care Performed by: Pricilla Loveless, MD Authorized by: Pricilla Loveless, MD   Critical care provider statement:    Critical care time (minutes):  35   Critical care time was exclusive of:  Separately billable procedures and treating other patients   Critical care was necessary to treat or prevent imminent or life-threatening deterioration of the following conditions:  Shock, sepsis and renal failure   Critical care was time spent personally by me on the following activities:  Development of treatment plan with patient or surrogate, discussions with consultants, evaluation of patient's response to treatment, examination of patient, obtaining history from patient or surrogate, ordering and performing treatments and interventions, ordering and review of laboratory studies, ordering and review of radiographic studies, pulse oximetry, re-evaluation of patient's condition and review of old charts   (including critical care time)  Medications Ordered in ED Medications  metroNIDAZOLE (FLAGYL) IVPB 500 mg (0 mg Intravenous Stopped 08/06/18 1400)  heparin injection 5,000 Units (has no administration in time range)  lactated ringers infusion ( Intravenous New Bag/Given 08/06/18 1616)  acetaminophen (TYLENOL) tablet 650 mg (has no administration in time range)    Or  acetaminophen (TYLENOL) suppository 650 mg (has no administration in time range)  ipratropium-albuterol (DUONEB) 0.5-2.5 (3) MG/3ML nebulizer solution 3 mL (has no administration in time range)  albuterol (PROVENTIL)  (2.5 MG/3ML) 0.083% nebulizer solution 2.5 mg (has no administration in time range)  divalproex (DEPAKOTE SPRINKLE) capsule 250-500 mg (has no administration in time range)  fluticasone (FLOVENT HFA) 44 MCG/ACT inhaler 1 puff (has no administration in time range)  pantoprazole (PROTONIX) EC tablet 40 mg (has no administration in time range)  ceFEPIme (MAXIPIME) 1 g in sodium chloride 0.9 % 100 mL IVPB (has no administration in time range)  vancomycin (VANCOCIN) 1,250 mg in sodium chloride 0.9 % 250 mL IVPB (has no administration in time range)  sodium chloride 0.9 % bolus 1,000 mL (0 mLs Intravenous Stopped 08/06/18 1326)  ceFEPIme (MAXIPIME) 2 g in sodium chloride 0.9 % 100 mL IVPB (0 g Intravenous Stopped 08/06/18 1242)  acetaminophen (TYLENOL) suppository 650 mg (650 mg Rectal Given 08/06/18 1138)  vancomycin (VANCOCIN) 1,750 mg in sodium chloride 0.9 % 500 mL IVPB (1,750 mg Intravenous New Bag/Given 08/06/18 1359)  sodium chloride 0.9 % bolus 1,000 mL (0 mLs Intravenous Stopped 08/06/18 1604)     Initial Impression / Assessment and Plan / ED Course  I have reviewed the triage vital signs and the nursing notes.  Pertinent labs & imaging results that were available during my care of the patient were reviewed by me and considered in my medical decision making (see chart for details).     Patient is significantly altered on arrival though has somewhat improved and is now opening her eyes more after fluids and defervesced since.  Unclear exact source of her fever though she does have many bacteria and a catheterized urine specimen.  She was given broad antibiotics.  She is protecting her airway and I do not think intubation is needed, especially with her DNR status.  I did discuss with her son over the phone and discussed her overall ill presentation.  He does not want CPR but  would consider intubation if it was going to be very brief.  Otherwise, for now wants treatments and then will reassess as far  as whether or not we should go down the palliative route.  Hospitalist will admit.  Final Clinical Impressions(s) / ED Diagnoses   Final diagnoses:  Severe sepsis (HCC)  Acute kidney injury [facility](HCC)    ED Discharge Orders    None       Pricilla LovelessGoldston, Nala Kachel, MD 08/06/18 1645

## 2018-08-06 NOTE — ED Notes (Signed)
This RN spoke with RN at [facility] concerning pt's mental baseline. Pt has been declining over past few days. Pt more somnolent; not eating as well, blank stares. Per staff, pt usually will mumble but it is hard to understand, but otherwise does not talk much. Pt at baseline has contracture in the left hand/arm. Pt is wheelchair boud/uses hoyer lift to move.

## 2018-08-06 NOTE — Progress Notes (Signed)
Pharmacy Antibiotic Note  Deborah [name] is a 72 y.o. female admitted on 08/06/2018 with sepsis.  Pharmacy has been consulted for Vanco/Cefepime dosing.  CC/HPI: Unresponsive, fever, tachy, elevated LA. CXR negative, CT negative for acute,   PMH: advanced dementia, prior CVA, COPD, bipolar, HTN, HLD, depression, diverticulosis, GERG, gout, hepatomegaly, HH, IBS, nonalcoholic liver dz, peripheral neuropathy, PLMD  Significant events: recently treated for UTI at facility over past week prior to admit  ID: Sepsis with recent UTI, TMax 102. LA 3.43. Flagyl 12/5>> Vanco 12/5>> Cefepime 12/5>>  Vancomycin 1250 mg IV Q 36 hrs. Goal AUC 400-550. Expected AUC: 485.8 SCr used: 1.91   Plan: Cefepime 1g IV q 24h Vanco 1750mg  IV x 1 in ED then 1250mg  IV q 36h. Levels at steady state. Flagyl 500mg  IV q 8 hrs.  Height: 5\' 4"  (162.6 cm) Weight: 169 lb 15.6 oz (77.1 kg) IBW/kg (Calculated) : 54.7  Temp (24hrs), Avg:100.7 F (38.2 C), Min:99.3 F (37.4 C), Max:102 F (38.9 C)  Recent Labs  Lab 08/06/18 1200 08/06/18 1217  WBC 14.7*  --   CREATININE 1.91*  --   LATICACIDVEN  --  3.43*    Estimated Creatinine Clearance: 26.4 mL/min (A) (by C-G formula based on SCr of 1.91 mg/dL (H)).    Allergies  Allergen Reactions  . Bee Venom Anaphylaxis  . Dicyclomine Hcl Anaphylaxis  . Omeprazole Other (See Comments)     GI upset.  . Other Other (See Comments)    Reaction to reductase inhibitors per MAR  . Statins Other (See Comments)    Myalgias     Deborah [name], PharmD, BCPS Clinical Staff Pharmacist  Deborah [name], Deborah [name] 08/06/2018 3:53 PM

## 2018-08-06 NOTE — ED Notes (Signed)
Pt returned from CT Message sent to pharmacy for Vancomycin

## 2018-08-06 NOTE — H&P (Signed)
History and Physical    Deborah RocheJoanne W [name] XBJ:[id number] DOB: [birth year] DOA: 08/06/2018  PCP: Agapito GamesMetheney, Catherine D, MD  Patient coming from: Memory Care Unit  Chief Complaint: Unresponsiveness, fevers  HPI: Deborah [name] is a 72 y.o. female with medical history significant of advanced dementia, prior CVA, COPD, bipolar, HTN, HLD who presents to ED from memory care unit with increased mental status change. Patient is unable to provide meaningful history and no family present. Per chart review, patient recently treated for UTI at facility over past week prior to admit. One day prior to admit, patient noted to have increased confusion and later fever as high as 102.56F with minimal responsiveness, tachypnea. EMS called and patient noted to be tachycardic as well, received IVF bolus en route to ED. Of note, patient presented with portable DNR form and is currently active with hospice.  ED Course: In the ED, patient noted to have temp of 102F with tachycardia and RR as high as 28. Na noted to be 154, K of 5.5, Cr of 1.91, Bili of 2.1 with WBC of 14.7 and Hgb of 17.3. CXR obtained which was found to be unremarkable. UA with many bacteria, 6-10 WBC, otherwise neg leuk, neg blood. Head CT unremarkable for acute process. Patient was started on empiric vanc, flagyl, cefepime for broad coverage. Pan cultures obtained. 2L of IVF boluses given. Hospitalist consulted for consideration for admission.  Review of Systems:  Review of Systems  Unable to perform ROS: Dementia    Past Medical History:  Diagnosis Date  . Abscess of face 05/06/2014  . Bipolar disorder (HCC)    Saul FordyceKelly Virgil, NP  . Cardiomegaly   . Chronic headache   . COPD (chronic obstructive pulmonary disease) (HCC)   . Dementia (HCC)   . Depression   . Diverticulosis of colon (without mention of hemorrhage)   . Esophageal reflux   . Family history of colon cancer   . Gout flare    Left toe  . Headache(784.0)   . Hepatomegaly   .  Hiatal hernia   . Hyperlipidemia   . Hypertension   . Irritable bowel syndrome   . Nausea   . Other chronic nonalcoholic liver disease   . Other specified disorders of liver   . Peripheral neuropathy   . PLMD (periodic limb movement disorder)     Past Surgical History:  Procedure Laterality Date  . ABDOMINAL HYSTERECTOMY     partial  . APPENDECTOMY    . CHOLECYSTECTOMY    . OVARIAN CYST REMOVAL    . removal of cyst on chest wall       reports that she has never smoked. She has never used smokeless tobacco. She reports that she does not drink alcohol or use drugs.  Allergies  Allergen Reactions  . Bee Venom Anaphylaxis  . Dicyclomine Hcl Anaphylaxis  . Omeprazole Other (See Comments)     GI upset.  . Other Other (See Comments)    Reaction to reductase inhibitors per MAR  . Statins Other (See Comments)    Myalgias     Family History  Problem Relation Age of Onset  . Emphysema Mother   . Heart disease Father   . Mental illness Father        Bipolar disorder  . Alzheimer's disease Brother   . Colon cancer Maternal Grandfather   . Bipolar disorder Brother     Prior to Admission medications   Medication Sig Start Date End Date Taking? Authorizing Provider  acetaminophen (TYLENOL) 500 MG tablet Take 500 mg by mouth every 8 (eight) hours as needed (pain).    [provider]  albuterol (VENTOLIN HFA) 108 (90 BASE) MCG/ACT inhaler Inhale 1 puff into the lungs every 6 (six) hours as needed for wheezing or shortness of breath (nocturnal coughing).    [provider]  allopurinol (ZYLOPRIM) 100 MG tablet Take 100 mg by mouth daily.    [provider]  atropine 1 % ophthalmic solution Place 4 drops under the tongue See admin instructions. EVERY 2-3 HOURS AS NEEDED FOR TERMINAL SECRETIONS    [provider]  Cholecalciferol (VITAMIN D) 2000 UNITS tablet Take 2,000 Units by mouth daily.    [provider]  divalproex (DEPAKOTE  SPRINKLE) 125 MG capsule Take 250-500 mg by mouth See admin instructions. 250 mg in the morning and 500 mg at bedtime    [provider]  DULoxetine (CYMBALTA) 60 MG capsule Take 60 mg by mouth daily.    [provider]  ENSURE PLUS (ENSURE PLUS) LIQD Take 237 mLs by mouth 2 (two) times daily between meals.    [provider]  famotidine (PEPCID) 20 MG tablet Take 20 mg by mouth daily.    [provider]  fluticasone (FLOVENT HFA) 44 MCG/ACT inhaler Inhale 1 puff into the lungs 2 (two) times daily. Rinse mouth after use    [provider]  Ipratropium-Albuterol (COMBIVENT RESPIMAT) 20-100 MCG/ACT AERS respimat Inhale 1 puff into the lungs every 6 (six) hours.    [provider]  loperamide (IMODIUM) 2 MG capsule Take 2 mg by mouth as needed for diarrhea or loose stools.    [provider]  LORazepam (ATIVAN) 0.5 MG tablet Take 0.5 mg by mouth every 8 (eight) hours as needed for anxiety.    [provider]  losartan (COZAAR) 100 MG tablet Take 0.5 tablets (50 mg total) by mouth daily. 04/07/15   Jeralyn Bennett, MD  metoprolol (LOPRESSOR) 50 MG tablet Take 0.5 tablets (25 mg total) by mouth 2 (two) times daily. 12/07/16   Pearson Grippe, MD  morphine (ROXANOL) 20 MG/ML concentrated solution Take 10 mg by mouth every 4 (four) hours as needed (for pain).    [provider]  oxyCODONE (OXY IR/ROXICODONE) 5 MG immediate release tablet Take 5 mg by mouth every 4 (four) hours as needed for severe pain.    [provider]  polyethylene glycol (MIRALAX / GLYCOLAX) packet Take 17 g by mouth daily as needed for mild constipation or moderate constipation.     [provider]  Prenatal Vit-Fe Fumarate-FA (MULTIVITAMIN-PRENATAL) 27-0.8 MG TABS tablet Take 1 tablet by mouth daily at 12 noon. For anemia    [provider]  promethazine (PHENERGAN) 25 MG suppository Place 25 mg rectally every 6 (six) hours as needed for  nausea or vomiting.    [provider]  promethazine (PHENERGAN) 25 MG tablet Take 25 mg by mouth every 4 (four) hours as needed for nausea.     [provider]  QUEtiapine (SEROQUEL) 25 MG tablet Take 25 mg by mouth at bedtime.    [provider]  Skin Protectants, Misc. (BAZA PROTECT EX) Apply 1 application topically See admin instructions. TO BOTTOM/BUTTOCKS EVERY SHIFT (8 HRS)    [provider]    Physical Exam: Vitals:   08/06/18 1300 08/06/18 1330 08/06/18 1333 08/06/18 1400  BP: 101/82 102/70  (!) 94/53  Pulse:    99  Resp: (!) 25 (!)  23  20  Temp:   99.3 F (37.4 C)   TempSrc:   Axillary   SpO2:    97%    Constitutional: NAD, calm, comfortable Vitals:   08/06/18 1300 08/06/18 1330 08/06/18 1333 08/06/18 1400  BP: 101/82 102/70  (!) 94/53  Pulse:    99  Resp: (!) 25 (!) 23  20  Temp:   99.3 F (37.4 C)   TempSrc:   Axillary   SpO2:    97%   Eyes: PERRL, lids and conjunctivae normal ENMT: Mucous membranes dry. Posterior pharynx clear of any exudate or lesions.Normal dentition.  Neck: normal, supple, no masses, no thyromegaly Respiratory: clear to auscultation bilaterally, no wheezing, no crackles. Normal respiratory effort. No accessory muscle use.  Cardiovascular: Tachycardic, s1, s2  Abdomen: no tenderness, no masses palpated. No hepatosplenomegaly. Decreased BS Musculoskeletal: no clubbing / cyanosis. No joint deformity upper and lower extremities. Good ROM, no contractures. Normal muscle tone.  Skin: no rashes, lesions, poor skin turgor Neurologic: CN 2-12 grossly intact. Sensation intact, DTR normal. Strength 5/5 in all 4.  Psychiatric: Unable to assess, pt non-verbal and unable to provide own history   Labs on Admission: I have personally reviewed following labs and imaging studies  CBC: Recent Labs  Lab 08/06/18 1200  WBC 14.7*  NEUTROABS 10.7*  HGB 17.3*  HCT 56.8*  MCV 96.4  PLT 351   Basic Metabolic  Panel: Recent Labs  Lab 08/06/18 1200  NA 154*  K 5.5*  CL 115*  CO2 25  GLUCOSE 115*  BUN 43*  CREATININE 1.91*  CALCIUM 9.0   GFR: CrCl cannot be calculated (Unknown ideal weight.). Liver Function Tests: Recent Labs  Lab 08/06/18 1200  AST 67*  ALT 39  ALKPHOS 42  BILITOT 2.1*  PROT 6.8  ALBUMIN 3.5   No results for input(s): LIPASE, AMYLASE in the last 168 hours. No results for input(s): AMMONIA in the last 168 hours. Coagulation Profile: No results for input(s): INR, PROTIME in the last 168 hours. Cardiac Enzymes: No results for input(s): CKTOTAL, CKMB, CKMBINDEX, TROPONINI in the last 168 hours. BNP (last 3 results) No results for input(s): PROBNP in the last 8760 hours. HbA1C: No results for input(s): HGBA1C in the last 72 hours. CBG: Recent Labs  Lab 08/06/18 1129  GLUCAP 94   Lipid Profile: No results for input(s): CHOL, HDL, LDLCALC, TRIG, CHOLHDL, LDLDIRECT in the last 72 hours. Thyroid Function Tests: No results for input(s): TSH, T4TOTAL, FREET4, T3FREE, THYROIDAB in the last 72 hours. Anemia Panel: No results for input(s): VITAMINB12, FOLATE, FERRITIN, TIBC, IRON, RETICCTPCT in the last 72 hours. Urine analysis:    Component Value Date/Time   COLORURINE AMBER (A) 08/06/2018 1208   APPEARANCEUR HAZY (A) 08/06/2018 1208   LABSPEC 1.026 08/06/2018 1208   PHURINE 5.0 08/06/2018 1208   GLUCOSEU NEGATIVE 08/06/2018 1208   HGBUR SMALL (A) 08/06/2018 1208   HGBUR negative 04/23/2010 1050   BILIRUBINUR SMALL (A) 08/06/2018 1208   BILIRUBINUR neg 04/20/2013 1213   KETONESUR NEGATIVE 08/06/2018 1208   PROTEINUR NEGATIVE 08/06/2018 1208   UROBILINOGEN 1.0 03/31/2015 1238   NITRITE NEGATIVE 08/06/2018 1208   LEUKOCYTESUR NEGATIVE 08/06/2018 1208   Sepsis Labs: !!!!!!!!!!!!!!!!!!!!!!!!!!!!!!!!!!!!!!!!!!!! @LABRCNTIP (procalcitonin:4,lacticidven:4) )No results found for this or any previous visit (from the past 240 hour(s)).   Radiological Exams on  Admission: Personally reviewed Ct Head Wo Contrast  Result Date: 08/06/2018 CLINICAL DATA:  Altered level of consciousness EXAM: CT HEAD WITHOUT CONTRAST TECHNIQUE: Contiguous axial images  were obtained from the base of the skull through the vertex without intravenous contrast. COMPARISON:  01/31/2016 FINDINGS: Brain: Chronic atrophic changes as well as chronic white matter ischemic changes are seen. No findings to suggest acute hemorrhage, acute infarction or space-occupying mass lesion are noted. Stable encephalomalacia near the vertex is noted on the right. Vascular: No hyperdense vessel or unexpected calcification. Skull: Normal. Negative for fracture or focal lesion. Sinuses/Orbits: No acute finding. Stable chronic opacification of the right maxillary antrum is seen. Other: None. IMPRESSION: Chronic atrophic and ischemic changes without acute intracranial abnormality. Chronic opacification of the right maxillary antrum. Electronically Signed   By: Alcide Clever M.D.   On: 08/06/2018 13:35   Dg Chest Portable 1 View  Result Date: 08/06/2018 CLINICAL DATA:  Altered mental status beginning yesterday. EXAM: PORTABLE CHEST 1 VIEW COMPARISON:  Single-view of the chest 12/05/2016 and 01/31/2016. FINDINGS: Lungs are clear. Heart size is upper normal. No pneumothorax or pleural fluid. No acute or focal bony abnormality. IMPRESSION: No acute disease. Electronically Signed   By: Drusilla Kanner M.D.   On: 08/06/2018 12:32    EKG: Independently reviewed. Sinus tach  Assessment/Plan Principal Problem:   Severe sepsis (HCC) Active Problems:   Irritable bowel syndrome   Moderate dementia (HCC)   Stroke due to intracerebral hemorrhage (HCC)   Acute encephalopathy   Hypertension   Hyperlipidemia   COPD (chronic obstructive pulmonary disease) (HCC)   Dehydration   1. Severe sepsis 1. Presents with fevers, tachycardia, tachypnea, elevated lactic acid, and ARF  2. Unclear etiology 3. Pan cultures  obtained, pending 4. Continue broad spectrum abx (vanc, cefepime, flagyl) as ordered 5. Repeat cbc in AM 6. Follow bmet 2. Acute toxic metabolic encephalopathy 1. Likely secondary to combination severe dehydration with sepsis in setting of underlying dementia 2. Cont IVF and abx per above 3. ARF 1. Clinically dehdrated with poor skin turgor and dry mucus membranes 2. Continue IVF as tolerated 4. HTN 1. BP stable, albeit soft at this time 2. Continue lower dose of metoprolol with hold parameters 3. Hold ARB secondary to ARF 5. HLD 1. Not on statin prior to admit 6. Dementia 1. Cont supportive care and tx as per above 2. Will continue QHS seroquel  7. COPD 1. On minimal O2 support 2. No wheezing on auscultation 3. Continue PRN duoneb  DVT prophylaxis: Heparin subQ  Code Status: DNR, patient presents with portable DNR form Family Communication: Pt in room, family not at bedside  Disposition Plan: Uncertain at this time  Consults called:  Admission status: Inpatient as would require over 2 midnight stay to treat severe sepsis with IVF and IV abx   Rickey Barbara MD [facility] Pager On Amion  If 7PM-7AM, please contact night-coverage  08/06/2018, 2:06 PM

## 2018-08-07 LAB — BASIC METABOLIC PANEL
ANION GAP: 8 (ref 5–15)
Anion gap: 11 (ref 5–15)
BUN: 31 mg/dL — ABNORMAL HIGH (ref 8–23)
BUN: 25 mg/dL — ABNORMAL HIGH (ref 8–23)
CHLORIDE: 121 mmol/L — AB (ref 98–111)
CO2: 18 mmol/L — ABNORMAL LOW (ref 22–32)
CO2: 24 mmol/L (ref 22–32)
Calcium: 8.2 mg/dL — ABNORMAL LOW (ref 8.9–10.3)
Calcium: 8.1 mg/dL — ABNORMAL LOW (ref 8.9–10.3)
Chloride: 128 mmol/L — ABNORMAL HIGH (ref 98–111)
Creatinine, Ser: 0.9 mg/dL (ref 0.44–1.00)
Creatinine, Ser: 0.84 mg/dL (ref 0.44–1.00)
GFR calc Af Amer: >60 mL/min (ref >60)
GFR calc Af Amer: >60 mL/min (ref >60)
GFR calc non Af Amer: >60 mL/min (ref >60)
GFR calc non Af Amer: >60 mL/min (ref >60)
Glucose, Bld: 83 mg/dL (ref 70–99)
Glucose, Bld: 100 mg/dL — ABNORMAL HIGH (ref 70–99)
Potassium: 5.7 mmol/L — ABNORMAL HIGH (ref 3.5–5.1)
Potassium: 3.3 mmol/L — ABNORMAL LOW (ref 3.5–5.1)
Sodium: 157 mmol/L — ABNORMAL HIGH (ref 135–145)
Sodium: 153 mmol/L — ABNORMAL HIGH (ref 135–145)

## 2018-08-07 LAB — CBC
HEMATOCRIT: 43.2 % (ref 36.0–46.0)
Hemoglobin: 12.8 g/dL (ref 12.0–15.0)
MCH: 28.9 pg (ref 26.0–34.0)
MCHC: 29.6 g/dL — ABNORMAL LOW (ref 30.0–36.0)
MCV: 97.5 fL (ref 80.0–100.0)
Platelets: 225 10*3/uL (ref 150–400)
RBC: 4.43 MIL/uL (ref 3.87–5.11)
RDW: 15.5 % (ref 11.5–15.5)
WBC: 9.3 10*3/uL (ref 4.0–10.5)
nRBC: 0 % (ref 0.0–0.2)

## 2018-08-07 LAB — COMPREHENSIVE METABOLIC PANEL
ALT: 37 U/L (ref 0–44)
AST: 45 U/L — ABNORMAL HIGH (ref 15–41)
Albumin: 2.4 g/dL — ABNORMAL LOW (ref 3.5–5.0)
Alkaline Phosphatase: 31 U/L — ABNORMAL LOW (ref 38–126)
Anion gap: 8 (ref 5–15)
BILIRUBIN TOTAL: 1.1 mg/dL (ref 0.3–1.2)
BUN: 32 mg/dL — ABNORMAL HIGH (ref 8–23)
CO2: 22 mmol/L (ref 22–32)
Calcium: 7.9 mg/dL — ABNORMAL LOW (ref 8.9–10.3)
Chloride: 125 mmol/L — ABNORMAL HIGH (ref 98–111)
Creatinine, Ser: 0.95 mg/dL (ref 0.44–1.00)
GFR, EST NON AFRICAN AMERICAN: 59 mL/min — AB (ref >60)
Glucose, Bld: 84 mg/dL (ref 70–99)
Potassium: 3.4 mmol/L — ABNORMAL LOW (ref 3.5–5.1)
Sodium: 155 mmol/L — ABNORMAL HIGH (ref 135–145)
TOTAL PROTEIN: 4.7 g/dL — AB (ref 6.5–8.1)

## 2018-08-07 MED ORDER — DEXTROSE 5 % IV SOLN
INTRAVENOUS | Status: DC
  Administered 2018-08-07 – 2018-08-10 (×5): via INTRAVENOUS

## 2018-08-07 MED ORDER — VALPROATE SODIUM 500 MG/5ML IV SOLN
250.0000 mg | Freq: Two times a day (BID) | INTRAVENOUS | Status: DC
  Administered 2018-08-07 – 2018-08-08 (×3): 250 mg via INTRAVENOUS
  Filled 2018-08-07 (×5): qty 2.5

## 2018-08-07 NOTE — ED Notes (Signed)
Attempted to call report

## 2018-08-07 NOTE — Evaluation (Signed)
Physical Therapy Evaluation and Discharge Patient Details Name: Deborah [name] MRN: [id number] DOB: [birth year] Today's Date: 08/07/2018   History of Present Illness  Pt is a 72 y/o female admitted from Memory care unit secondary to severe sepsis of unknown etiology. Pt also with metabolic encephalopathy. Pt recently has been treated for UTI per notes. CT of head negative for acute abnormality. PMH includes dementia, CVA, HTN, COPD, and bipolar disorder.   Clinical Impression  Pt admitted secondary to problem above with deficits below. Pt lethargic throughout session, however, did increase alertness for brief period when sitting at EOB. Pt with BLE knee flexion contractures and contractures noted in LUE. Required total A for bed mobility tasks, and pt presenting with heavy L lateral lean. Anticipate pt is close to baseline mobility status given level of contractures and current assist level, so feel further needs can be met at SNF. Will sign off at this time. If pt found to require continued acute PT services, please re-consult.     Follow Up Recommendations SNF;Supervision/Assistance - 24 hour    Equipment Recommendations  None recommended by PT    Recommendations for Other Services       Precautions / Restrictions Precautions Precautions: Fall Restrictions Weight Bearing Restrictions: No      Mobility  Bed Mobility Overal bed mobility: Needs Assistance Bed Mobility: Sidelying to Sit;Sit to Sidelying   Sidelying to sit: Total assist     Sit to sidelying: Total assist General bed mobility comments: Total A to go from L sidelying to sitting. Pt with L lateral lean and requiring total A to maintain sitting balance. Pt with brief period of eye opening and began mumbling. Unable to attempt further mobility.   Transfers                    Ambulation/Gait                Stairs            Wheelchair Mobility    Modified Rankin (Stroke Patients Only)       Balance Overall balance assessment: Needs assistance Sitting-balance support: No upper extremity supported;Feet supported Sitting balance-Leahy Scale: Zero Sitting balance - Comments: Total A to maintain sitting balance and pt with heavy L lateral lean.  Postural control: Left lateral lean                                   Pertinent Vitals/Pain      Home Living Family/patient expects to be discharged to:: Skilled nursing facility                 Additional Comments: Per notes, pt from memory care unit. Pt unable to answer any questions about PLOF.     Prior Function           Comments: Unsure of PLOF as pt unable to answer any questions.      Hand Dominance        Extremity/Trunk Assessment   Upper Extremity Assessment Upper Extremity Assessment: Overall WFL for tasks assessed;LUE deficits/detail LUE Deficits / Details: Noted flexion contracture at elbow. Limited ROM    Lower Extremity Assessment Lower Extremity Assessment: RLE deficits/detail;LLE deficits/detail RLE Deficits / Details: Noted increased knee flexion contractures bilaterally. Unable to extend LEs secondary to contractures. Ankles able to be DF to neutral.  LLE Deficits / Details: Noted increased knee flexion contractures bilaterally. Unable  to extend LEs secondary to contractures. Ankle able to be DF to neutral.        Communication   Communication: Expressive difficulties  Cognition Arousal/Alertness: Lethargic Behavior During Therapy: Flat affect Overall Cognitive Status: Difficult to assess                                 General Comments: Pt lethargic upon entry, however, did increase alertness when assisting pt with sitting at EOB. Pt opened eyes for brief period when sitting at EOB and began to mumble.       General Comments General comments (skin integrity, edema, etc.): No caregiver present.     Exercises     Assessment/Plan    PT Assessment  Patent does not need any further PT services;All further PT needs can be met in the next venue of care  PT Problem List Decreased range of motion;Decreased activity tolerance;Decreased strength;Decreased balance;Decreased cognition;Decreased knowledge of use of DME;Decreased safety awareness;Decreased knowledge of precautions;Decreased coordination       PT Treatment Interventions      PT Goals (Current goals can be found in the Care Plan section)  Acute Rehab PT Goals PT Goal Formulation: Patient unable to participate in goal setting Time For Goal Achievement: 08/07/18 Potential to Achieve Goals: Fair    Frequency     Barriers to discharge        Co-evaluation               AM-PAC PT "6 Clicks" Mobility  Outcome Measure Help needed turning from your back to your side while in a flat bed without using bedrails?: Total Help needed moving from lying on your back to sitting on the side of a flat bed without using bedrails?: Total Help needed moving to and from a bed to a chair (including a wheelchair)?: Total Help needed standing up from a chair using your arms (e.g., wheelchair or bedside chair)?: Total Help needed to walk in hospital room?: Total Help needed climbing 3-5 steps with a railing? : Total 6 Click Score: 6    End of Session   Activity Tolerance: Patient limited by lethargy Patient left: in bed;with call bell/phone within reach;with bed alarm set Nurse Communication: Mobility status PT Visit Diagnosis: Difficulty in walking, not elsewhere classified (R26.2);Muscle weakness (generalized) (M62.81)    Time: 1040-1050 PT Time Calculation (min) (ACUTE ONLY): 10 min   Charges:   PT Evaluation $PT Eval Moderate Complexity: [place], PT, DPT  Acute Rehabilitation Services  Pager: [phone] Office: [phone]   Rudean Hitt 08/07/2018, 11:08 AM

## 2018-08-07 NOTE — Progress Notes (Signed)
SLP Bedside swallow Cancellation Note  Patient Details Name: Deborah [name] MRN: [id number] DOB: 1946-04-11   Cancelled treatment:       Reason Eval/Treat Not Completed: Fatigue/lethargy limiting ability to participate. D/W Rn, who will page SLP if pt's MS improves this afternoon.     Blenda MountsCouture, Derin Matthes Laurice 08/07/2018, 10:01 AM  Jill SideAmanda L. Samson Fredericouture, MA CCC/SLP Acute Rehabilitation Services Office number [phone] Pager [phone]

## 2018-08-07 NOTE — Progress Notes (Signed)
OT Cancellation Note  Patient Details Name: Deborah [name] MRN: [id number] DOB: [birth year]   Cancelled Treatment:    Reason Eval/Treat Not Completed: OT screened, no needs identified, will sign off. [facility] has [facility] total care for the past 2 weeks. Prior to that she could feed herself and propel her w/c down the hall. Noted, hospice care has been initiated. Will sign off. Should pt's condition improve and OT services are indicated, please reorder.   Raynald KempKathryn Mechille Varghese, OT Acute Rehabilitation Services Pager: [phone] Office: [phone]  08/07/2018, 11:38 AM

## 2018-08-07 NOTE — Progress Notes (Signed)
[facility]: RN visit   This patient is a new referral to [facility]. Patient has not been seen by hospice staff prior to this ED visit and hospitalization.  [facility]. Chart and patient information under review by [facility]. Hospice eligibility pending at this time.   Writer spoke with daughters, Tobi Bastosnna and Lurena JoinerRebecca at bedside to initiate education related to hospice philosophy, services and team approach to care. Family verbalized understanding of information given. Per discussion, plan is for discharge to back to [facility] with [facility] services.   Please send signed and completed DNR form home with patient/family. Patient will need prescriptions for discharge comfort medications.   DME needs are to be determined.    [facility] Referral Center aware of the above. Please notify [facility] when patient is ready to leave the unit at discharge. (Call [phone] or [phone] after 5pm.) [facility] information and contact numbers given to family at time of visit. Above information shared with  Gavin PoundDeborah, [facility].   Please call with any hospice related questions.   Thank you for this referral.   Elsie SaasMary Anne Robertson, RN, [facility] Liaison [phone] ? Hospital liaisons are now on AMION.

## 2018-08-07 NOTE — Progress Notes (Signed)
[facility]: RN.  Please use [facility] for ambulance transport at time of discharge.  Please call with any hospice related questions.   Thank you,    Elsie SaasMary Anne Robertson, RN, [facility]iaison [phone] ? Hospital liaisons are now on AMION.

## 2018-08-07 NOTE — Care Management Note (Signed)
Case Management Note  Patient Details  Name: Jilda RocheJoanne W Fraga MRN: [id number] Date of Birth: 07-Oct-1945  Subjective/Objective:  From [facility], [facility], she states patient is a new referral for them they just have not been able to get to her before she came to hospital.  They do not need another referral , she is good to go back to ALF when ready with Hospice.  Nita SellsMaryann will let us know if she needs to use [facility] for transport or [facility] for transport.                   Action/Plan: DC back to ALF [facility] at discharge.  Expected Discharge Date:                  Expected Discharge Plan:  Assisted Living / Rest Home  In-House Referral:  Clinical Social Work  Discharge planning Services  CM Consult  Post Acute Care Choice:  Hospice Choice offered to:     DME Arranged:    DME Agency:     HH Arranged:    HH Agency:     Status of Service:  Completed, signed off  If discussed at [employer] Meetings, dates discussed:    Additional Comments:  Leone Havenaylor, Zebbie Ace Clinton, RN 08/07/2018, 3:32 PM

## 2018-08-07 NOTE — Progress Notes (Signed)
[facility] Progress Note  Patient: Deborah RocheJoanne W [place] JXB:[id number]   PCP: Agapito GamesMetheney, Catherine D, MD DOB: August 05, 1946   DOA: 08/06/2018   DOS: 08/07/2018   Date of Service: the patient was seen and examined on 08/07/2018  Brief hospital course: Pt. with PMH of  advanced dementia, prior CVA, COPD, bipolar, HTN, HLD ; admitted on 08/06/2018, presented with complaint of unresponsiveness, was found to have sepsis due to UTI. Currently further plan is continue IV antibiotics.  Subjective: Drowsy and lethargic, not following any commands.  Assessment and Plan: 1. Severe sepsis 1. Presents with fevers, tachycardia, tachypnea, elevated lactic acid, and ARF  2. Unclear etiology 3. Pan cultures obtained, pending 4. Continue broad spectrum abx (vanc, cefepime, flagyl) as ordered 5. Repeat cbc in AM 6. Follow bmet 2. Acute toxic metabolic encephalopathy 1. Likely secondary to combination severe dehydration with sepsis in setting of underlying dementia 2. Cont IVF and abx per above 3. ARF 1. Clinically dehdrated with poor skin turgor and dry mucus membranes 2. Continue IVF as tolerated 4. HTN 1. BP stable, albeit soft at this time 2. Continue lower dose of metoprolol with hold parameters 3. Hold ARB secondary to ARF 5. HLD 1. Not on statin prior to admit 6. Dementia 1. Cont supportive care and tx as per above 2. Will continue QHS seroquel  7. COPD 1. On minimal O2 support 2. No wheezing on auscultation Continue PRN duoneb  Goals of care discussion. Patient is active with palliative care outpatient but not on hospice. Reportedly based on the discussion with the hospice personnel, patient will be discharged with hospice services at memory care-  Pressure Injury 08/07/18 (Active)  08/07/18 0300   Location: [facility]  Location Orientation:   Staging:   Wound Description (Comments):   Present on Admission:      Diet: NPO DVT Prophylaxis: subcutaneous Heparin  Advance goals of care  discussion: DNR DNI  Family Communication: no family was present at bedside, at the time of interview.   Disposition:  Discharge to SNF.  Consultants: none Procedures: none  Scheduled Meds: . budesonide  0.25 mg Nebulization BID  . heparin  5,000 Units Subcutaneous Q8H   Continuous Infusions: . ceFEPime (MAXIPIME) IV 1 g (08/07/18 1300)  . dextrose 50 mL/hr at 08/07/18 1245  . valproate sodium 250 mg (08/07/18 1625)   PRN Meds: acetaminophen **OR** acetaminophen, albuterol, ipratropium-albuterol Antibiotics: Anti-infectives (From admission, onward)   Start     Dose/Rate Route Frequency Ordered Stop   08/08/18 0200  vancomycin (VANCOCIN) 1,250 mg in sodium chloride 0.9 % 250 mL IVPB  Status:  Discontinued     1,250 mg 166.7 mL/hr over 90 Minutes Intravenous Every 36 hours 08/06/18 1557 08/07/18 0757   08/07/18 1200  ceFEPIme (MAXIPIME) 1 g in sodium chloride 0.9 % 100 mL IVPB     1 g 200 mL/hr over 30 Minutes Intravenous Every 24 hours 08/06/18 1550     08/06/18 1545  ceFEPIme (MAXIPIME) 2 g in sodium chloride 0.9 % 100 mL IVPB  Status:  Discontinued     2 g 200 mL/hr over 30 Minutes Intravenous STAT 08/06/18 1538 08/06/18 1538   08/06/18 1515  metroNIDAZOLE (FLAGYL) IVPB 500 mg  Status:  Discontinued     500 mg 100 mL/hr over 60 Minutes Intravenous Every 8 hours 08/06/18 1503 08/06/18 1529   08/06/18 1300  vancomycin (VANCOCIN) 1,750 mg in sodium chloride 0.9 % 500 mL IVPB     1,750 mg 250 mL/hr over 120  Minutes Intravenous  Once 08/06/18 1136 08/06/18 1832   08/06/18 1130  ceFEPIme (MAXIPIME) 2 g in sodium chloride 0.9 % 100 mL IVPB     2 g 200 mL/hr over 30 Minutes Intravenous  Once 08/06/18 1126 08/06/18 1242   08/06/18 1130  metroNIDAZOLE (FLAGYL) IVPB 500 mg  Status:  Discontinued     500 mg 100 mL/hr over 60 Minutes Intravenous Every 8 hours 08/06/18 1126 08/07/18 0757   08/06/18 1130  vancomycin (VANCOCIN) IVPB 1000 mg/200 mL premix  Status:  Discontinued     1,000  mg 200 mL/hr over 60 Minutes Intravenous  Once 08/06/18 1126 08/06/18 1136       Objective: Physical Exam: Vitals:   08/07/18 0758 08/07/18 0836 08/07/18 1120 08/07/18 1700  BP: 95/60 95/60 (!) 89/77 115/65  Pulse: 80 88 87 89  Resp: 14 16 15 14   Temp: 98.1 F (36.7 C)  98.2 F (36.8 C) 98 F (36.7 C)  TempSrc: Oral  Oral Oral  SpO2: 100% 98% 100% 100%  Weight:      Height:        Intake/Output Summary (Last 24 hours) at 08/07/2018 1926 Last data filed at 08/07/2018 1800 Gross per 24 hour  Intake 442.71 ml  Output 152 ml  Net 290.71 ml   Filed Weights   08/06/18 1500 08/07/18 0132  Weight: 77.1 kg 64.7 kg   General: Drowsy and lethargic, unable to follow any commands. Eyes: PERRL, Conjunctiva normal ENT: Oral Mucosa clear moist. Neck: no JVD, no Abnormal Mass Or lumps Cardiovascular: S1 and S2 Present, aortic systolic  Murmur, Peripheral Pulses Present Respiratory: normal respiratory effort, Bilateral Air entry equal and Decreased, no use of accessory muscle, Clear to Auscultation, no Crackles, no wheezes Abdomen: Bowel Sound present, Soft and no tenderness, no hernia Skin: no redness, no Rash, no induration Extremities: no Pedal edema, no calf tenderness Neurologic: Grossly no focal neuro deficit  Data Reviewed: CBC: Recent Labs  Lab 08/06/18 1200 08/06/18 2014 08/07/18 0624  WBC 14.7* 11.5* 9.3  NEUTROABS 10.7*  --   --   HGB 17.3* 14.1 12.8  HCT 56.8* 48.4* 43.2  MCV 96.4 98.4 97.5  PLT 351 243 225   Basic Metabolic Panel: Recent Labs  Lab 08/06/18 1200 08/06/18 2014 08/07/18 0624 08/07/18 1307  NA 154*  --  155* 157*  K 5.5*  --  3.4* 5.7*  CL 115*  --  125* 128*  CO2 25  --  22 18*  GLUCOSE 115*  --  84 83  BUN 43*  --  32* 31*  CREATININE 1.91* 1.30* 0.95 0.90  CALCIUM 9.0  --  7.9* 8.2*    Liver Function Tests: Recent Labs  Lab 08/06/18 1200 08/07/18 0624  AST 67* 45*  ALT 39 37  ALKPHOS 42 31*  BILITOT 2.1* 1.1  PROT 6.8 4.7*    ALBUMIN 3.5 2.4*   No results for input(s): LIPASE, AMYLASE in the last 168 hours. No results for input(s): AMMONIA in the last 168 hours. Coagulation Profile: No results for input(s): INR, PROTIME in the last 168 hours. Cardiac Enzymes: No results for input(s): CKTOTAL, CKMB, CKMBINDEX, TROPONINI in the last 168 hours. BNP (last 3 results) No results for input(s): PROBNP in the last 8760 hours. CBG: Recent Labs  Lab 08/06/18 1129  GLUCAP 94   Studies: No results found.   Time spent: 35 minutes  Author: Lynden Oxford, MD [facility] Pager: [phone] 08/07/2018 7:26 PM  Between 7PM-7AM, please  contact night-coverage at [email or web address], password [facility]

## 2018-08-08 LAB — BASIC METABOLIC PANEL
Anion gap: 7 (ref 5–15)
Anion gap: 5 (ref 5–15)
Anion gap: 7 (ref 5–15)
BUN: 22 mg/dL (ref 8–23)
BUN: 16 mg/dL (ref 8–23)
BUN: 13 mg/dL (ref 8–23)
CALCIUM: 7.8 mg/dL — AB (ref 8.9–10.3)
CO2: 27 mmol/L (ref 22–32)
CO2: 27 mmol/L (ref 22–32)
CO2: 27 mmol/L (ref 22–32)
Calcium: 8.1 mg/dL — ABNORMAL LOW (ref 8.9–10.3)
Calcium: 7.6 mg/dL — ABNORMAL LOW (ref 8.9–10.3)
Chloride: 119 mmol/L — ABNORMAL HIGH (ref 98–111)
Chloride: 117 mmol/L — ABNORMAL HIGH (ref 98–111)
Chloride: 112 mmol/L — ABNORMAL HIGH (ref 98–111)
Creatinine, Ser: 0.78 mg/dL (ref 0.44–1.00)
Creatinine, Ser: 0.63 mg/dL (ref 0.44–1.00)
Creatinine, Ser: 0.58 mg/dL (ref 0.44–1.00)
GFR calc Af Amer: >60 mL/min (ref >60)
GFR calc Af Amer: >60 mL/min (ref >60)
GFR calc Af Amer: >60 mL/min (ref >60)
GFR calc non Af Amer: >60 mL/min (ref >60)
GFR calc non Af Amer: >60 mL/min (ref >60)
GFR calc non Af Amer: >60 mL/min (ref >60)
Glucose, Bld: 117 mg/dL — ABNORMAL HIGH (ref 70–99)
Glucose, Bld: 130 mg/dL — ABNORMAL HIGH (ref 70–99)
Glucose, Bld: 107 mg/dL — ABNORMAL HIGH (ref 70–99)
Potassium: 3.4 mmol/L — ABNORMAL LOW (ref 3.5–5.1)
Potassium: 3 mmol/L — ABNORMAL LOW (ref 3.5–5.1)
Potassium: 3 mmol/L — ABNORMAL LOW (ref 3.5–5.1)
SODIUM: 149 mmol/L — AB (ref 135–145)
Sodium: 153 mmol/L — ABNORMAL HIGH (ref 135–145)
Sodium: 146 mmol/L — ABNORMAL HIGH (ref 135–145)

## 2018-08-08 LAB — URINE CULTURE: Culture: >=100000 — AB

## 2018-08-08 MED ORDER — CEFAZOLIN SODIUM-DEXTROSE 1-4 GM/50ML-% IV SOLN
1.0000 g | Freq: Two times a day (BID) | INTRAVENOUS | Status: DC
  Administered 2018-08-08 – 2018-08-10 (×6): 1 g via INTRAVENOUS
  Filled 2018-08-08 (×10): qty 50

## 2018-08-08 MED ORDER — LACTATED RINGERS IV BOLUS
1000.0000 mL | Freq: Once | INTRAVENOUS | Status: AC
  Administered 2018-08-08: 1000 mL via INTRAVENOUS

## 2018-08-08 NOTE — Evaluation (Signed)
Clinical/Bedside Swallow Evaluation Patient Details  Name: Deborah [name] MRN: [id number] Date of Birth: Jun 14, 1946  Today's Date: 08/08/2018 Time: SLP Start Time (ACUTE ONLY): 0825 SLP Stop Time (ACUTE ONLY): 0852 SLP Time Calculation (min) (ACUTE ONLY): 27 min  Past Medical History:  Past Medical History:  Diagnosis Date  . Abscess of face 05/06/2014  . Bipolar disorder (HCC)    Saul FordyceKelly Virgil, NP  . Cardiomegaly   . Chronic headache   . COPD (chronic obstructive pulmonary disease) (HCC)   . Dementia (HCC)   . Depression   . Diverticulosis of colon (without mention of hemorrhage)   . Esophageal reflux   . Family history of colon cancer   . Gout flare    Left toe  . Headache(784.0)   . Hepatomegaly   . Hiatal hernia   . Hyperlipidemia   . Hypertension   . Irritable bowel syndrome   . Nausea   . Other chronic nonalcoholic liver disease   . Other specified disorders of liver   . Peripheral neuropathy   . PLMD (periodic limb movement disorder)    Past Surgical History:  Past Surgical History:  Procedure Laterality Date  . ABDOMINAL HYSTERECTOMY     partial  . APPENDECTOMY    . CHOLECYSTECTOMY    . OVARIAN CYST REMOVAL    . removal of cyst on chest wall     HPI:   72 y.o. female, resident of [facility], admitted on 08/06/2018 with sepsis. PMH: advanced dementia, prior CVA, COPD, bipolar, HTN, HLD, depression, diverticulosis, GERD, gout, hepatomegaly, HH, IBS, nonalcoholic liver dz, peripheral neuropathy, PLMD; failed [facility] swallow screen 08/06/18; BSE ordered; attempted 08/07/18, but unable to be completed d/t lethargy.   Assessment / Plan / Recommendation Clinical Impression   Cognitive-based oropharyngeal dyphagia noted with oral holding, multiple swallows, decreased oral manipulation/propulsion for all consistencies; max verbal/tactile cues provided by SLP with puree to assist with propulsion/manipulation; Limited oral care performed d/t pt participation/awareness; pt  with eyes closed for BSE, but participatory despite decreased mentation; ice chips provided without overt s/s of aspiration noted, but oral holding noted without mod verbal cues provided; delayed throat clearing with larger volume of thin liquids, but not with smaller amounts via straw;recommend NPO with ice chips allowed after oral care provided; ST will follow for PO readiness/diet recommendation as pt mentation improves during acute stay.  Thank you for this consult. SLP Visit Diagnosis: Dysphagia, unspecified (R13.10)    Aspiration Risk  Moderate aspiration risk;Risk for inadequate nutrition/hydration    Diet Recommendation   NPO; ice chips allowed prior to and after oral care  Medication Administration: Via alternative means    Other  Recommendations Oral Care Recommendations: Oral care QID;Oral care prior to ice chip/H20   Follow up Recommendations Skilled Nursing facility      Frequency and Duration min 2x/week  1 week       Prognosis Prognosis for Safe Diet Advancement: Good Barriers to Reach Goals: Cognitive deficits      Swallow Study   General HPI:  72 y.o. female, resident of [facility], admitted on 08/06/2018 with sepsis. PMH: advanced dementia, prior CVA, COPD, bipolar, HTN, HLD, depression, diverticulosis, GERD, gout, hepatomegaly, HH, IBS, nonalcoholic liver dz, peripheral neuropathy, PLMD.   Type of Study: Bedside Swallow Evaluation Previous Swallow Assessment: ([facility] performed 08/06/18; did not pass d/t lethargy) Diet Prior to this Study: NPO Temperature Spikes Noted: No Respiratory Status: Nasal cannula History of Recent Intubation: No Behavior/Cognition: Confused;Lethargic/Drowsy;Requires cueing Oral Cavity Assessment:  Dry Oral Care Completed by SLP: Yes Oral Cavity - Dentition: Poor condition;Missing dentition Self-Feeding Abilities: Total assist Patient Positioning: Upright in bed Baseline Vocal Quality: Low vocal intensity;Other (comment)(minimal  verbalizations) Volitional Cough: Cognitively unable to elicit Volitional Swallow: Unable to elicit    Oral/Motor/Sensory Function Overall Oral Motor/Sensory Function: Generalized oral weakness Facial ROM: Other (Comment)(grossly WFL)   Ice Chips Ice chips: Impaired Presentation: Spoon Oral Phase Impairments: Reduced lingual movement/coordination Oral Phase Functional Implications: Oral holding Pharyngeal Phase Impairments: Suspected delayed Swallow;Multiple swallows   Thin Liquid Thin Liquid: Impaired Presentation: Straw Oral Phase Impairments: Reduced lingual movement/coordination Oral Phase Functional Implications: Prolonged oral transit;Oral holding Pharyngeal  Phase Impairments: Suspected delayed Swallow;Multiple swallows;Throat Clearing - Delayed;Other (comments)(throat clearing with larger volumes)    Nectar Thick Nectar Thick Liquid: Not tested   Honey Thick Honey Thick Liquid: Not tested   Puree Puree: Impaired Presentation: Spoon Oral Phase Impairments: Reduced lingual movement/coordination;Poor awareness of bolus Oral Phase Functional Implications: Oral holding Pharyngeal Phase Impairments: Suspected delayed Swallow   Solid     Solid: Not tested      [name], M.S., CCC-SLP 08/08/2018,9:44 AM

## 2018-08-08 NOTE — Progress Notes (Signed)
[facility] Progress Note  Patient: Deborah [name] ZOX:[id number]   PCP: Agapito Games, MD DOB: [birth year]   DOA: 08/06/2018   DOS: 08/08/2018   Date of Service: the patient was seen and examined on 08/08/2018  Brief hospital course: Pt. with PMH of  advanced dementia, prior CVA, COPD, bipolar, HTN, HLD ; admitted on 08/06/2018, presented with complaint of unresponsiveness, was found to have sepsis due to UTI. Currently further plan is continue IV antibiotics.  Subjective: still Drowsy and lethargic, not following any commands. Son at bedside and mentions that she has been declining for last few weeks.   Assessment and Plan: 1. Severe sepsis 1. Presents with fevers, tachycardia, tachypnea, elevated lactic acid, and ARF  2. Unclear etiology 3. Pan cultures obtained, urine growing e coli,  4. Continue Antibiotics,  2. Acute toxic metabolic encephalopathy 1. Likely secondary to combination severe dehydration with sepsis in setting of underlying dementia 2. Cont IVF and abx per above 3. ARF 1. Clinically dehdrated with poor skin turgor and dry mucus membranes 2. Continue IVF as tolerated 4. HTN 1. BP stable, albeit soft at this time 2. Hold metoprolol with hold parameters 3. Hold ARB secondary to ARF 5. HLD 1. Not on statin prior to admit 6. Dementia 1. Cont supportive care and tx as per above 2. Will continue QHS seroquel  7. COPD 1. On minimal O2 support 2. No wheezing on auscultation Continue PRN duoneb  Goals of care discussion. Patient is active with palliative care outpatient but not on hospice. Reportedly based on the discussion with the hospice personnel, patient will be discharged with hospice services at memory care SON WANT TO CONTINUE TO TREAT WHAT IS TREATABLE.   Pressure Injury 08/07/18 (Active)  08/07/18 0300   Location: [facility]  Location Orientation:   Staging:   Wound Description (Comments):   Present on Admission:      Diet: NPO DVT  Prophylaxis: subcutaneous Heparin  Advance goals of care discussion: DNR DNI  Family Communication: no family was present at bedside, at the time of interview.   Disposition:  Discharge to SNF.  Consultants: none Procedures: none  Scheduled Meds: . budesonide  0.25 mg Nebulization BID  . heparin  5,000 Units Subcutaneous Q8H   Continuous Infusions: .  ceFAZolin (ANCEF) IV 1 g (08/08/18 1000)  . dextrose 50 mL/hr at 08/08/18 0557   PRN Meds: acetaminophen **OR** acetaminophen, albuterol, ipratropium-albuterol Antibiotics: Anti-infectives (From admission, onward)   Start     Dose/Rate Route Frequency Ordered Stop   08/08/18 0900  ceFAZolin (ANCEF) IVPB 1 g/50 mL premix     1 g 100 mL/hr over 30 Minutes Intravenous Every 12 hours 08/08/18 0825     08/08/18 0200  vancomycin (VANCOCIN) 1,250 mg in sodium chloride 0.9 % 250 mL IVPB  Status:  Discontinued     1,250 mg 166.7 mL/hr over 90 Minutes Intravenous Every 36 hours 08/06/18 1557 08/07/18 0757   08/07/18 1200  ceFEPIme (MAXIPIME) 1 g in sodium chloride 0.9 % 100 mL IVPB  Status:  Discontinued     1 g 200 mL/hr over 30 Minutes Intravenous Every 24 hours 08/06/18 1550 08/08/18 0758   08/06/18 1545  ceFEPIme (MAXIPIME) 2 g in sodium chloride 0.9 % 100 mL IVPB  Status:  Discontinued     2 g 200 mL/hr over 30 Minutes Intravenous STAT 08/06/18 1538 08/06/18 1538   08/06/18 1515  metroNIDAZOLE (FLAGYL) IVPB 500 mg  Status:  Discontinued     500  mg 100 mL/hr over 60 Minutes Intravenous Every 8 hours 08/06/18 1503 08/06/18 1529   08/06/18 1300  vancomycin (VANCOCIN) 1,750 mg in sodium chloride 0.9 % 500 mL IVPB     1,750 mg 250 mL/hr over 120 Minutes Intravenous  Once 08/06/18 1136 08/06/18 1832   08/06/18 1130  ceFEPIme (MAXIPIME) 2 g in sodium chloride 0.9 % 100 mL IVPB     2 g 200 mL/hr over 30 Minutes Intravenous  Once 08/06/18 1126 08/06/18 1242   08/06/18 1130  metroNIDAZOLE (FLAGYL) IVPB 500 mg  Status:  Discontinued      500 mg 100 mL/hr over 60 Minutes Intravenous Every 8 hours 08/06/18 1126 08/07/18 0757   08/06/18 1130  vancomycin (VANCOCIN) IVPB 1000 mg/200 mL premix  Status:  Discontinued     1,000 mg 200 mL/hr over 60 Minutes Intravenous  Once 08/06/18 1126 08/06/18 1136       Objective: Physical Exam: Vitals:   08/08/18 1230 08/08/18 1322 08/08/18 1517 08/08/18 1615  BP: (!) 88/54 (!) 93/58 113/74 (!) 82/53  Pulse: 89 85 91 76  Resp: 18 18 17 18   Temp: 98 F (36.7 C)   98.8 F (37.1 C)  TempSrc: Oral   Oral  SpO2: 94% 97% 98% 97%  Weight:      Height:        Intake/Output Summary (Last 24 hours) at 08/08/2018 1736 Last data filed at 08/08/2018 1500 Gross per 24 hour  Intake 2378.42 ml  Output 400 ml  Net 1978.42 ml   Filed Weights   08/06/18 1500 08/07/18 0132  Weight: 77.1 kg 64.7 kg   General: Drowsy and lethargic, unable to follow any commands. Eyes: PERRL, Conjunctiva normal ENT: Oral Mucosa clear moist. Neck: no JVD, no Abnormal Mass Or lumps Cardiovascular: S1 and S2 Present, aortic systolic  Murmur, Peripheral Pulses Present Respiratory: normal respiratory effort, Bilateral Air entry equal and Decreased, no use of accessory muscle, Clear to Auscultation, no Crackles, no wheezes Abdomen: Bowel Sound present, Soft and no tenderness, no hernia Skin: no redness, no Rash, no induration Extremities: no Pedal edema, no calf tenderness Neurologic: Grossly no focal neuro deficit  Data Reviewed: CBC: Recent Labs  Lab 08/06/18 1200 08/06/18 2014 08/07/18 0624  WBC 14.7* 11.5* 9.3  NEUTROABS 10.7*  --   --   HGB 17.3* 14.1 12.8  HCT 56.8* 48.4* 43.2  MCV 96.4 98.4 97.5  PLT 351 243 225   Basic Metabolic Panel: Recent Labs  Lab 08/07/18 0624 08/07/18 1307 08/07/18 2018 08/08/18 0443 08/08/18 1336  NA 155* 157* 153* 153* 149*  K 3.4* 5.7* 3.3* 3.4* 3.0*  CL 125* 128* 121* 119* 117*  CO2 22 18* 24 27 27   GLUCOSE 84 83 100* 117* 130*  BUN 32* 31* 25* 22 16    CREATININE 0.95 0.90 0.84 0.78 0.63  CALCIUM 7.9* 8.2* 8.1* 8.1* 7.8*    Liver Function Tests: Recent Labs  Lab 08/06/18 1200 08/07/18 0624  AST 67* 45*  ALT 39 37  ALKPHOS 42 31*  BILITOT 2.1* 1.1  PROT 6.8 4.7*  ALBUMIN 3.5 2.4*   No results for input(s): LIPASE, AMYLASE in the last 168 hours. No results for input(s): AMMONIA in the last 168 hours. Coagulation Profile: No results for input(s): INR, PROTIME in the last 168 hours. Cardiac Enzymes: No results for input(s): CKTOTAL, CKMB, CKMBINDEX, TROPONINI in the last 168 hours. BNP (last 3 results) No results for input(s): PROBNP in the last 8760 hours. CBG: Recent  Labs  Lab 08/06/18 1129  GLUCAP 94   Studies: No results found.   Time spent: 35 minutes  Author: Lynden OxfordPranav Gery Sabedra, MD [facility] Pager: [phone] 08/08/2018 5:36 PM  Between 7PM-7AM, please contact night-coverage at [email or web address], password [email or web address]

## 2018-08-08 NOTE — Progress Notes (Signed)
Pharmacy Antibiotic Note  Deborah [name] is a 72 y.o. female on day # 3 Cefepime for sepsis coverage to change to Cefazolin for E coli UTI.  Last Cefepime dose 12/6 at 1pm.  Blood cultures negative to date.  Plan:  Cefazolin 1 gm IV q12hrs.  Follow renal function for any need to adjust.  Follow up final blood cultures, length of therapy.  Height: 5\' 4"  (162.6 cm) Weight: 142 lb 10.2 oz (64.7 kg) IBW/kg (Calculated) : 54.7  Temp (24hrs), Avg:98.4 F (36.9 C), Min:97.9 F (36.6 C), Max:99 F (37.2 C)  Recent Labs  Lab 08/06/18 1200 08/06/18 1217 08/06/18 1500 08/06/18 1658 08/06/18 2014 08/07/18 0624 08/07/18 1307 08/07/18 2018 08/08/18 0443  WBC 14.7*  --   --   --  11.5* 9.3  --   --   --   CREATININE 1.91*  --   --   --  1.30* 0.95 0.90 0.84 0.78  LATICACIDVEN  --  3.43* 1.9 2.04* 1.8  --   --   --   --     Estimated Creatinine Clearance: 54.1 mL/min (by C-G formula based on SCr of 0.78 mg/dL).    Allergies  Allergen Reactions  . Bee Venom Anaphylaxis  . Dicyclomine Hcl Anaphylaxis  . Omeprazole Other (See Comments)     GI upset.  . Other Other (See Comments)    Reaction to reductase inhibitors per MAR  . Statins Other (See Comments)    Myalgias     Antimicrobials this admission: Flagyl 12/5>> 12/6 Vancomycin 12/5>> 12/6 Cefepime 12/5>>12/7 Cefazolin 12/7>>  Dose adjustments this admission:  n/a  Microbiology results:  12/5 urine - > 100 K/ml E coli - pansensitive  125 blood x 2 - no growth x 1 day to date  Thank you for allowing pharmacy to be a part of this patient's care.  Dennie Fettersgan, Darian Ace Donovan, [place] Pager: [phone] or phone: [phone] 08/08/2018 9:34 AM

## 2018-08-09 LAB — CBC WITH DIFFERENTIAL/PLATELET
Abs Immature Granulocytes: 0.04 10*3/uL (ref 0.00–0.07)
BASOS ABS: 0 10*3/uL (ref 0.0–0.1)
Basophils Relative: 1 %
EOS ABS: 0.4 10*3/uL (ref 0.0–0.5)
Eosinophils Relative: 5 %
HEMATOCRIT: 41.2 % (ref 36.0–46.0)
Hemoglobin: 12.8 g/dL (ref 12.0–15.0)
Immature Granulocytes: 1 %
Lymphocytes Relative: 31 %
Lymphs Abs: 2.1 10*3/uL (ref 0.7–4.0)
MCH: 29.4 pg (ref 26.0–34.0)
MCHC: 31.1 g/dL (ref 30.0–36.0)
MCV: 94.5 fL (ref 80.0–100.0)
Monocytes Absolute: 0.6 10*3/uL (ref 0.1–1.0)
Monocytes Relative: 9 %
Neutro Abs: 3.7 10*3/uL (ref 1.7–7.7)
Neutrophils Relative %: 53 %
Platelets: 205 10*3/uL (ref 150–400)
RBC: 4.36 MIL/uL (ref 3.87–5.11)
RDW: 14.5 % (ref 11.5–15.5)
WBC: 6.8 10*3/uL (ref 4.0–10.5)
nRBC: 0 % (ref 0.0–0.2)

## 2018-08-09 LAB — COMPREHENSIVE METABOLIC PANEL
ALK PHOS: 33 U/L — AB (ref 38–126)
ALT: 31 U/L (ref 0–44)
ANION GAP: 7 (ref 5–15)
AST: 35 U/L (ref 15–41)
Albumin: 2.1 g/dL — ABNORMAL LOW (ref 3.5–5.0)
BUN: 11 mg/dL (ref 8–23)
CO2: 29 mmol/L (ref 22–32)
Calcium: 8.1 mg/dL — ABNORMAL LOW (ref 8.9–10.3)
Chloride: 113 mmol/L — ABNORMAL HIGH (ref 98–111)
Creatinine, Ser: 0.66 mg/dL (ref 0.44–1.00)
GFR calc non Af Amer: >60 mL/min (ref >60)
Glucose, Bld: 116 mg/dL — ABNORMAL HIGH (ref 70–99)
Potassium: 3.8 mmol/L (ref 3.5–5.1)
SODIUM: 149 mmol/L — AB (ref 135–145)
Total Bilirubin: 0.7 mg/dL (ref 0.3–1.2)
Total Protein: 4.6 g/dL — ABNORMAL LOW (ref 6.5–8.1)

## 2018-08-09 LAB — MAGNESIUM: Magnesium: 1.8 mg/dL (ref 1.7–2.4)

## 2018-08-09 NOTE — Progress Notes (Signed)
[facility] Progress Note  Patient: Deborah [name]:[id number]   PCP: Agapito Games, MD DOB: 06/02/1946   DOA: 08/06/2018   DOS: 08/09/2018   Date of Service: the patient was seen and examined on 08/09/2018  Brief hospital course: Pt. with PMH of  advanced dementia, prior CVA, COPD, bipolar, HTN, HLD ; admitted on 08/06/2018, presented with complaint of unresponsiveness, was found to have sepsis due to UTI. Currently further plan is continue IV antibiotics.  Subjective: Awake, following occasional commands, still talking incoherently.  Not opening her eyes.  No nausea no vomiting no agitation.  Assessment and Plan: 1. Severe sepsis secondary to UTI from E. coli 1. Presents with fevers, tachycardia, tachypnea, elevated lactic acid, and ARF  2. Unclear etiology 3. Pan cultures obtained, urine growing e coli,  4. Continue Antibiotics, IV for now while the patient is n.p.o. 2. Acute toxic and metabolic encephalopathy 1. Likely secondary to combination severe dehydration with sepsis in setting of underlying dementia 2. Cont IVF and abx per above 3. Acute kidney injury with hypernatremia 1. Clinically dehdrated with poor skin turgor and dry mucus membranes 2. Continue IVF as tolerated 4. HTN 1. BP stable, albeit soft at this time 2. Hold metoprolol with hold parameters 3. Hold ARB secondary to ARF 5. HLD 1. Not on statin prior to admit 6. Dementia 1. Cont supportive care and tx as per above 2. Will continue QHS seroquel  3. Prognosis severely guarded as the patient remains at high risk for aspiration regardless of her progress as well as 7. COPD 1. On minimal O2 support 2. No wheezing on auscultation 3. Continue PRN duoneb  8.  Goals of care discussion Patient is active with palliative care outpatient but not on hospice. Reportedly based on the discussion with the hospice personnel, patient will be discharged with hospice services at memory care SON WANT TO CONTINUE  TO TREAT WHAT IS TREATABLE.  Patient is currently n.p.o. due to inability to follow any commands consistently for safely passing swallow eval. I suspect that even if she passes swallow eval her diet will be significantly restricted which would lead to recurrent dehydration episode as well as aspiration events. This information was clearly relayed to son and informed that the patient's prognosis remains guarded secondary to the same.  Pressure Injury 08/07/18 (Active)  08/07/18 0300   Location: [facility]  Location Orientation:   Staging:   Wound Description (Comments):   Present on Admission:      Diet: NPO DVT Prophylaxis: subcutaneous Heparin  Advance goals of care discussion: DNR DNI  Family Communication: family was present at bedside, at the time of interview.   Disposition:  Discharge to SNF with Palliative care support  Consultants: none Procedures: none  Scheduled Meds: . budesonide  0.25 mg Nebulization BID  . heparin  5,000 Units Subcutaneous Q8H   Continuous Infusions: .  ceFAZolin (ANCEF) IV 1 g (08/09/18 1125)  . dextrose 50 mL/hr at 08/09/18 0545   PRN Meds: acetaminophen **OR** acetaminophen, albuterol, ipratropium-albuterol Antibiotics: Anti-infectives (From admission, onward)   Start     Dose/Rate Route Frequency Ordered Stop   08/08/18 0900  ceFAZolin (ANCEF) IVPB 1 g/50 mL premix     1 g 100 mL/hr over 30 Minutes Intravenous Every 12 hours 08/08/18 0825     08/08/18 0200  vancomycin (VANCOCIN) 1,250 mg in sodium chloride 0.9 % 250 mL IVPB  Status:  Discontinued     1,250 mg 166.7 mL/hr over 90 Minutes Intravenous Every  36 hours 08/06/18 1557 08/07/18 0757   08/07/18 1200  ceFEPIme (MAXIPIME) 1 g in sodium chloride 0.9 % 100 mL IVPB  Status:  Discontinued     1 g 200 mL/hr over 30 Minutes Intravenous Every 24 hours 08/06/18 1550 08/08/18 0758   08/06/18 1545  ceFEPIme (MAXIPIME) 2 g in sodium chloride 0.9 % 100 mL IVPB  Status:  Discontinued     2 g 200  mL/hr over 30 Minutes Intravenous STAT 08/06/18 1538 08/06/18 1538   08/06/18 1515  metroNIDAZOLE (FLAGYL) IVPB 500 mg  Status:  Discontinued     500 mg 100 mL/hr over 60 Minutes Intravenous Every 8 hours 08/06/18 1503 08/06/18 1529   08/06/18 1300  vancomycin (VANCOCIN) 1,750 mg in sodium chloride 0.9 % 500 mL IVPB     1,750 mg 250 mL/hr over 120 Minutes Intravenous  Once 08/06/18 1136 08/06/18 1832   08/06/18 1130  ceFEPIme (MAXIPIME) 2 g in sodium chloride 0.9 % 100 mL IVPB     2 g 200 mL/hr over 30 Minutes Intravenous  Once 08/06/18 1126 08/06/18 1242   08/06/18 1130  metroNIDAZOLE (FLAGYL) IVPB 500 mg  Status:  Discontinued     500 mg 100 mL/hr over 60 Minutes Intravenous Every 8 hours 08/06/18 1126 08/07/18 0757   08/06/18 1130  vancomycin (VANCOCIN) IVPB 1000 mg/200 mL premix  Status:  Discontinued     1,000 mg 200 mL/hr over 60 Minutes Intravenous  Once 08/06/18 1126 08/06/18 1136       Objective: Physical Exam: Vitals:   08/09/18 0743 08/09/18 0837 08/09/18 1156 08/09/18 1724  BP: 117/70  (!) 136/95 131/70  Pulse: 83  92 86  Resp: 20  16 18   Temp: 99.6 F (37.6 C)  97.6 F (36.4 C) 97.8 F (36.6 C)  TempSrc: Axillary  Axillary   SpO2: 100% 100% 100% 98%  Weight:      Height:        Intake/Output Summary (Last 24 hours) at 08/09/2018 1753 Last data filed at 08/09/2018 0814 Gross per 24 hour  Intake -  Output 252 ml  Net -252 ml   Filed Weights   08/06/18 1500 08/07/18 0132  Weight: 77.1 kg 64.7 kg   General: Drowsy and lethargic, unable to follow any commands. Eyes: PERRL, Conjunctiva normal ENT: Oral Mucosa clear moist. Neck: no JVD, no Abnormal Mass Or lumps Cardiovascular: S1 and S2 Present, aortic systolic  Murmur, Peripheral Pulses Present Respiratory: normal respiratory effort, Bilateral Air entry equal and Decreased, no use of accessory muscle, Clear to Auscultation, no Crackles, no wheezes Abdomen: Bowel Sound present, Soft and no tenderness, no  hernia Skin: no redness, no Rash, no induration Extremities: no Pedal edema, no calf tenderness Neurologic: Grossly no focal neuro deficit  Data Reviewed: CBC: Recent Labs  Lab 08/06/18 1200 08/06/18 2014 08/07/18 0624 08/09/18 0923  WBC 14.7* 11.5* 9.3 6.8  NEUTROABS 10.7*  --   --  3.7  HGB 17.3* 14.1 12.8 12.8  HCT 56.8* 48.4* 43.2 41.2  MCV 96.4 98.4 97.5 94.5  PLT 351 243 225 205   Basic Metabolic Panel: Recent Labs  Lab 08/07/18 2018 08/08/18 0443 08/08/18 1336 08/08/18 1947 08/09/18 0923  NA 153* 153* 149* 146* 149*  K 3.3* 3.4* 3.0* 3.0* 3.8  CL 121* 119* 117* 112* 113*  CO2 24 27 27 27 29   GLUCOSE 100* 117* 130* 107* 116*  BUN 25* 22 16 13 11   CREATININE 0.84 0.78 0.63 0.58 0.66  CALCIUM  8.1* 8.1* 7.8* 7.6* 8.1*  MG  --   --   --   --  1.8    Liver Function Tests: Recent Labs  Lab 08/06/18 1200 08/07/18 0624 08/09/18 0923  AST 67* 45* 35  ALT 39 37 31  ALKPHOS 42 31* 33*  BILITOT 2.1* 1.1 0.7  PROT 6.8 4.7* 4.6*  ALBUMIN 3.5 2.4* 2.1*   No results for input(s): LIPASE, AMYLASE in the last 168 hours. No results for input(s): AMMONIA in the last 168 hours. Coagulation Profile: No results for input(s): INR, PROTIME in the last 168 hours. Cardiac Enzymes: No results for input(s): CKTOTAL, CKMB, CKMBINDEX, TROPONINI in the last 168 hours. BNP (last 3 results) No results for input(s): PROBNP in the last 8760 hours. CBG: Recent Labs  Lab 08/06/18 1129  GLUCAP 94   Studies: No results found.   Time spent: 35 minutes  Author: Lynden Oxford, MD [facility] Pager: [phone] 08/09/2018 5:53 PM  Between 7PM-7AM, please contact night-coverage at [email or web address], password [facility]

## 2018-08-09 NOTE — Progress Notes (Signed)
[facility])   This patient is a new referral to [facility]. [facility]. Eligibility for hospice has been confirmed by hospice physician 08/07/18. [facility] continues to follow for hospice admission at facility upon discharge.    [facility] RN liaison Elsie SaasMary Anne Robertson spoke with daughters Tobi Bastosnna and Lurena JoinerRebecca 08/07/18 at bedside to initiate education related to hospice philosophy, services and team approach to care. Family verbalized understanding of information given. Per discussion, plan is for discharge to back to [facility] with [facility] services.   Please send signed and completed out of facility DNR form home with patient/family. Patient will need prescriptions for discharge comfort medications.   DME needs are to be determined.    [facility] Referral Center aware of the above. Please notify [facility] when patient is ready to leave the unit at discharge. (Call [phone] or [phone] after 5pm.) [facility] information and contact numbers given to family at time of visit. Above information shared with  Gavin PoundDeborah, [facility].   Please call with any hospice related questions.  Thank you,  Forrestine Himva Davis, LCSW [phone]  [facility] hosp[facility] liaisons are listed on AMION.

## 2018-08-10 ENCOUNTER — Other Ambulatory Visit: Payer: Self-pay

## 2018-08-10 LAB — BASIC METABOLIC PANEL
Anion gap: 9 (ref 5–15)
BUN: 6 mg/dL — ABNORMAL LOW (ref 8–23)
CO2: 29 mmol/L (ref 22–32)
Calcium: 8 mg/dL — ABNORMAL LOW (ref 8.9–10.3)
Chloride: 105 mmol/L (ref 98–111)
Creatinine, Ser: 0.7 mg/dL (ref 0.44–1.00)
GFR calc Af Amer: >60 mL/min (ref >60)
GFR calc non Af Amer: >60 mL/min (ref >60)
Glucose, Bld: 111 mg/dL — ABNORMAL HIGH (ref 70–99)
Potassium: 3.2 mmol/L — ABNORMAL LOW (ref 3.5–5.1)
Sodium: 143 mmol/L (ref 135–145)

## 2018-08-10 MED ORDER — ORAL CARE MOUTH RINSE
15.0000 mL | Freq: Two times a day (BID) | OROMUCOSAL | Status: DC
  Administered 2018-08-11 – 2018-08-14 (×7): 15 mL via OROMUCOSAL

## 2018-08-10 MED ORDER — CHLORHEXIDINE GLUCONATE 0.12 % MT SOLN
15.0000 mL | Freq: Two times a day (BID) | OROMUCOSAL | Status: DC
  Administered 2018-08-10 – 2018-08-14 (×8): 15 mL via OROMUCOSAL
  Filled 2018-08-10 (×8): qty 15

## 2018-08-10 MED ORDER — ENOXAPARIN SODIUM 30 MG/0.3ML ~~LOC~~ SOLN: 30.0000 mg | SUBCUTANEOUS | Status: DC

## 2018-08-10 MED ORDER — ENOXAPARIN SODIUM 40 MG/0.4ML ~~LOC~~ SOLN
40.0000 mg | SUBCUTANEOUS | Status: DC
  Administered 2018-08-11 – 2018-08-14 (×4): 40 mg via SUBCUTANEOUS
  Filled 2018-08-10 (×4): qty 0.4

## 2018-08-10 NOTE — Progress Notes (Signed)
  Speech Language Pathology Treatment: Dysphagia  Patient Details Name: Deborah [name] MRN: [id number] DOB: Apr 18, 1946 Today's Date: 08/10/2018 Time: [id number] SLP Time Calculation (min) (ACUTE ONLY): 10 min  Assessment / Plan / Recommendation Clinical Impression  Pt much more alert today, verbalizing, although not intelligibly, and showing increased interest in eating.  Demonstrated active recognition/anticipation of arriving bolus by spoon/cup, masticated purees and ice functionally, and swallowed after each bolus consistently.  There was occasional mild cough, throat-clearing on <10 % of trials. Pt known to SLP services from dysphagia assessment/follow-up in August of 2016, at which time cognitive decline was primary obstacle to safe eating.    Recommend resuming a dysphagia 1 diet with thin liquids.  Pt's risk of aspiration remains present, and safety with eating is related primarily to the fact that she is dependent on others to be fed.  REC: careful hand-feeding; hold tray if pt is uncomfortable, coughing frequently; feed only when sufficiently alert; give meds crushed in puree.    HPI HPI:  72 y.o. female, resident of [facility], admitted on 08/06/2018 with sepsis. PMH: advanced dementia, prior CVA, COPD, bipolar, HTN, HLD, depression, diverticulosis, GERD, gout, hepatomegaly, HH, IBS, nonalcoholic liver dz, peripheral neuropathy, PLMD.        SLP Plan  Continue with current plan of care       Recommendations  Diet recommendations: Thin liquid;Dysphagia 1 (puree) Liquids provided via: Cup;Straw Medication Administration: Crushed with puree Supervision: Trained caregiver to feed patient;Full supervision/cueing for compensatory strategies Compensations: Minimize environmental distractions Postural Changes and/or Swallow Maneuvers: Seated upright 90 degrees                Oral Care Recommendations: Oral care BID Follow up Recommendations: Skilled Nursing facility SLP Visit  Diagnosis: Dysphagia, unspecified (R13.10) Plan: Continue with current plan of care       GO                Blenda MountsCouture, Rheya Minogue Laurice 08/10/2018, 3:52 PM  Ennis Heavner L. Samson Fredericouture, MA CCC/SLP Acute Rehabilitation Services Office number [phone] Pager [phone]

## 2018-08-10 NOTE — Care Management Important Message (Signed)
Important Message  Patient Details  Name: Deborah [name] MRN: [id number] Date of Birth: Sep 18, 1945   Medicare Important Message Given:  Yes    Dorena BodoIris Rozalyn Osland 08/10/2018, 4:12 PM

## 2018-08-10 NOTE — Care Management Important Message (Signed)
Important Message  Patient Details  Name: Deborah RocheJoanne W Carrara MRN: [id number] Date of Birth: 14-Sep-1945   Medicare Important Message Given:  No  Correction Due to illness patient did not sign.  Unsigned copy left  Ricketta Colantonio 08/10/2018, 4:13 PM

## 2018-08-10 NOTE — NC FL2 (Addendum)
[place] MEDICAID FL2 LEVEL OF CARE SCREENING TOOL     IDENTIFICATION  Patient Name: Deborah [name] Birthdate: 1946-03-21 Sex: female Admission Date (Current Location): 08/06/2018  [facility] and [place] Number:  Producer, television/film/video and Address:  [facility] [place], [address] [place], [place] [address]      Provider Number: [id number]  Attending Physician Name and Address:  Rolly Salter, MD  Relative Name and Phone Number:       Current Level of Care: Hospital Recommended Level of Care: SNF Prior Approval Number:    Date Approved/Denied:   PASRR Number:    Discharge Plan: Skilled Nursing Facility   Current Diagnoses: Patient Active Problem List   Diagnosis Date Noted  . Dehydration 08/06/2018  . Severe sepsis (HCC) 08/06/2018  . Sepsis (HCC) 08/06/2018  . Acute blood loss anemia 12/05/2016  . Hypertension   . Hyperlipidemia   . Dementia (HCC)   . COPD (chronic obstructive pulmonary disease) (HCC)   . Acute encephalopathy   . Hypokalemia   . HCAP (healthcare-associated pneumonia) 03/31/2015  . HLD (hyperlipidemia) 05/18/2014  . Stroke due to intracerebral hemorrhage (HCC) 05/14/2014  . Stroke (HCC) 05/14/2014  . Stress incontinence 04/20/2013  . Moderate dementia (HCC) 04/20/2013  . PLMD (periodic limb movement disorder) 03/01/2013  . Hypoxia, sleep related 07/05/2011  . ACQUIRED HEMOLYTIC ANEMIA UNSPECIFIED 08/08/2010  . HYPERGLYCEMIA 06/01/2010  . HYPONATREMIA 04/11/2010  . HYPERLIPIDEMIA 03/24/2010  . PULMONARY NODULE 11/24/2009  . FATTY LIVER DISEASE 03/01/2009  . HEPATOMEGALY 03/01/2009  . 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 ASTHMA 09/17/2008  . 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 HYPERCHOLESTEROLEMIA 10/28/2007  . Bipolar disorder (HCC) 10/28/2007  . Essential hypertension 10/28/2007  . CARDIOMEGALY 10/28/2007  . ALLERGIC RHINITIS CAUSE UNSPECIFIED 10/28/2007  . COPD 10/28/2007  . HIATAL HERNIA 10/28/2007  .  DIVERTICULAR DISEASE 10/28/2007  . Irritable bowel syndrome 10/28/2007  . HEPATIC CYST 10/28/2007  . DEGENERATIVE JOINT DISEASE 10/28/2007  . HEADACHE, CHRONIC 10/28/2007  . ARRHYTHMIA, HX OF 10/28/2007    Orientation RESPIRATION BLADDER Height & Weight     (disoriented x4)  Normal Incontinent, External catheter(placed 08/07/18) Weight: 142 lb 10.2 oz (64.7 kg) Height:  5\' 4"  (162.6 cm)  BEHAVIORAL SYMPTOMS/MOOD NEUROLOGICAL BOWEL NUTRITION STATUS      Incontinent Diet(NPO at this time, diet subject to change, please see d/c summar)  AMBULATORY STATUS COMMUNICATION OF NEEDS Skin   Limited Assist Verbally PU Stage and Appropriate Care(Pressure injury, sacrum, foam dressing)                       Personal Care Assistance Level of Assistance  Bathing, Feeding, Dressing Bathing Assistance: Limited assistance Feeding assistance: Independent Dressing Assistance: Limited assistance     Functional Limitations Info  Hearing, Sight, Speech Sight Info: Adequate Hearing Info: Adequate Speech Info: Adequate    SPECIAL CARE FACTORS FREQUENCY                       Contractures Contractures Info: Not present    Additional Factors Info  Code Status, Allergies Code Status Info: DNR Allergies Info: Bee Venom, Dicyclomine Hcl, Omeprazole, Other, Statins           Current Medications (08/10/2018):  This is the current hospital active medication list Current Facility-Administered Medications  Medication Dose Route Frequency Provider Last Rate Last Dose  . acetaminophen (TYLENOL) tablet 650 mg  650 mg Oral Q6H PRN Rickey Barbara  K, MD       Or  . acetaminophen (TYLENOL) suppository 650 mg  650 mg Rectal Q6H PRN Jerald Kiefhiu, Stephen K, MD      . albuterol (PROVENTIL) (2.5 MG/3ML) 0.083% nebulizer solution 2.5 mg  2.5 mg Inhalation Q6H PRN Jerald Kiefhiu, Stephen K, MD      . budesonide (PULMICORT) nebulizer solution 0.25 mg  0.25 mg Nebulization BID Jerald Kiefhiu, Stephen K, MD   0.25 mg at 08/10/18  0907  . ceFAZolin (ANCEF) IVPB 1 g/50 mL premix  1 g Intravenous Q12H Scarlett Prestogan, Theresa D, RPH 100 mL/hr at 08/10/18 0846 1 g at 08/10/18 0846  . dextrose 5 % solution   Intravenous Continuous Rolly SalterPatel, Pranav M, MD 50 mL/hr at 08/10/18 0551    . enoxaparin (LOVENOX) injection 30 mg  30 mg Subcutaneous Q24H Rolly SalterPatel, Pranav M, MD      . ipratropium-albuterol (DUONEB) 0.5-2.5 (3) MG/3ML nebulizer solution 3 mL  3 mL Nebulization Q4H PRN Jerald Kiefhiu, Stephen K, MD         Discharge Medications: Please see discharge summary for a list of discharge medications.  Relevant Imaging Results:  Relevant Lab Results:   Additional Information SSN: [id number]  Maree KrabbeBridget A Christoper Bushey, LCSW

## 2018-08-10 NOTE — Progress Notes (Signed)
[facility] Progress Note  Patient: Deborah [name] ZOX:[id number]   PCP: Agapito Games, MD DOB: 1946/02/25   DOA: 08/06/2018   DOS: 08/10/2018   Date of Service: the patient was seen and examined on 08/10/2018  Brief hospital course: Pt. with PMH of  advanced dementia, prior CVA, COPD, bipolar, HTN, HLD ; admitted on 08/06/2018, presented with complaint of unresponsiveness, was found to have sepsis due to UTI. Currently further plan is continue IV antibiotics.  Subjective: Sleepy but arousable.  No acute events.  No nausea no vomiting no fever no chills.  Assessment and Plan: 1. Severe sepsis secondary to UTI from E. coli 1. Presents with fevers, tachycardia, tachypnea, elevated lactic acid, and ARF  2. Unclear etiology 3. Pan cultures obtained, urine growing e coli,  4. Continue Antibiotics, IV for now 2. Acute toxic and metabolic encephalopathy 1. Likely secondary to combination severe dehydration with sepsis in setting of underlying dementia 2. Cont IVF and abx per above 3. Acute kidney injury with hypernatremia 1. Clinically dehdrated with poor skin turgor and dry mucus membranes 2. Sodium is now normal, will hold IV fluid and monitor. 4. HTN 1. BP stable, albeit soft at this time 2. Hold metoprolol with hold parameters 3. Hold ARB secondary to ARF 5. HLD 1. Not on statin prior to admit 6. Dementia 1. Cont supportive care and tx as per above 2. Will continue QHS seroquel  3. Prognosis severely guarded as the patient remains at high risk for aspiration regardless of her progress as well as 4. Patient will be on dysphagia 1 diet thin liquids. 7. COPD 1. On minimal O2 support 2. No wheezing on auscultation 3. Continue PRN duoneb  8.  Goals of care discussion Patient is active with palliative care outpatient but not on hospice. Reportedly based on the discussion with the hospice personnel, patient will be discharged with hospice services at memory care SON WANT  TO CONTINUE TO TREAT WHAT IS TREATABLE.  Patient is currently n.p.o. due to inability to follow any commands consistently for safely passing swallow eval. I suspect that even if she passes swallow eval her diet will be significantly restricted which would lead to recurrent dehydration episode as well as aspiration events. This information was clearly relayed to son and informed that the patient's prognosis remains guarded secondary to the same.  Pressure Injury 08/07/18 (Active)  08/07/18 0300   Location: Sacrum  Location Orientation:   Staging:   Wound Description (Comments):   Present on Admission:      Diet: NPO DVT Prophylaxis: subcutaneous Heparin  Advance goals of care discussion: DNR DNI  Family Communication: family was present at bedside, at the time of interview.   Disposition:  Discharge to SNF with Palliative care support  Consultants: none Procedures: none  Scheduled Meds: . budesonide  0.25 mg Nebulization BID  . [START ON 08/11/2018] enoxaparin (LOVENOX) injection  40 mg Subcutaneous Q24H   Continuous Infusions: .  ceFAZolin (ANCEF) IV 1 g (08/10/18 0846)   PRN Meds: acetaminophen **OR** acetaminophen, albuterol Antibiotics: Anti-infectives (From admission, onward)   Start     Dose/Rate Route Frequency Ordered Stop   08/08/18 0900  ceFAZolin (ANCEF) IVPB 1 g/50 mL premix     1 g 100 mL/hr over 30 Minutes Intravenous Every 12 hours 08/08/18 0825     08/08/18 0200  vancomycin (VANCOCIN) 1,250 mg in sodium chloride 0.9 % 250 mL IVPB  Status:  Discontinued     1,250 mg 166.7 mL/hr over  90 Minutes Intravenous Every 36 hours 08/06/18 1557 08/07/18 0757   08/07/18 1200  ceFEPIme (MAXIPIME) 1 g in sodium chloride 0.9 % 100 mL IVPB  Status:  Discontinued     1 g 200 mL/hr over 30 Minutes Intravenous Every 24 hours 08/06/18 1550 08/08/18 0758   08/06/18 1545  ceFEPIme (MAXIPIME) 2 g in sodium chloride 0.9 % 100 mL IVPB  Status:  Discontinued     2 g 200 mL/hr  over 30 Minutes Intravenous STAT 08/06/18 1538 08/06/18 1538   08/06/18 1515  metroNIDAZOLE (FLAGYL) IVPB 500 mg  Status:  Discontinued     500 mg 100 mL/hr over 60 Minutes Intravenous Every 8 hours 08/06/18 1503 08/06/18 1529   08/06/18 1300  vancomycin (VANCOCIN) 1,750 mg in sodium chloride 0.9 % 500 mL IVPB     1,750 mg 250 mL/hr over 120 Minutes Intravenous  Once 08/06/18 1136 08/06/18 1832   08/06/18 1130  ceFEPIme (MAXIPIME) 2 g in sodium chloride 0.9 % 100 mL IVPB     2 g 200 mL/hr over 30 Minutes Intravenous  Once 08/06/18 1126 08/06/18 1242   08/06/18 1130  metroNIDAZOLE (FLAGYL) IVPB 500 mg  Status:  Discontinued     500 mg 100 mL/hr over 60 Minutes Intravenous Every 8 hours 08/06/18 1126 08/07/18 0757   08/06/18 1130  vancomycin (VANCOCIN) IVPB 1000 mg/200 mL premix  Status:  Discontinued     1,000 mg 200 mL/hr over 60 Minutes Intravenous  Once 08/06/18 1126 08/06/18 1136       Objective: Physical Exam: Vitals:   08/10/18 0801 08/10/18 0908 08/10/18 1223 08/10/18 1651  BP: 129/73  96/61 97/62  Pulse: 79  76 81  Resp: 15  14 13   Temp: 98.9 F (37.2 C)  98 F (36.7 C) 98.8 F (37.1 C)  TempSrc: Oral  Oral Oral  SpO2: 98% 93% 100% 97%  Weight:      Height:        Intake/Output Summary (Last 24 hours) at 08/10/2018 1835 Last data filed at 08/10/2018 1400 Gross per 24 hour  Intake 350 ml  Output 303 ml  Net 47 ml   Filed Weights   08/06/18 1500 08/07/18 0132  Weight: 77.1 kg 64.7 kg   General: Drowsy and lethargic, unable to follow any commands. Eyes: PERRL, Conjunctiva normal ENT: Oral Mucosa clear moist. Neck: no JVD, no Abnormal Mass Or lumps Cardiovascular: S1 and S2 Present, aortic systolic  Murmur, Peripheral Pulses Present Respiratory: normal respiratory effort, Bilateral Air entry equal and Decreased, no use of accessory muscle, Clear to Auscultation, no Crackles, no wheezes Abdomen: Bowel Sound present, Soft and no tenderness, no hernia Skin: no  redness, no Rash, no induration Extremities: no Pedal edema, no calf tenderness Neurologic: Grossly no focal neuro deficit  Data Reviewed: CBC: Recent Labs  Lab 08/06/18 1200 08/06/18 2014 08/07/18 0624 08/09/18 0923  WBC 14.7* 11.5* 9.3 6.8  NEUTROABS 10.7*  --   --  3.7  HGB 17.3* 14.1 12.8 12.8  HCT 56.8* 48.4* 43.2 41.2  MCV 96.4 98.4 97.5 94.5  PLT 351 243 225 205   Basic Metabolic Panel: Recent Labs  Lab 08/08/18 0443 08/08/18 1336 08/08/18 1947 08/09/18 0923 08/10/18 1127  NA 153* 149* 146* 149* 143  K 3.4* 3.0* 3.0* 3.8 3.2*  CL 119* 117* 112* 113* 105  CO2 27 27 27 29 29   GLUCOSE 117* 130* 107* 116* 111*  BUN 22 16 13 11  6*  CREATININE 0.78 0.63 0.58  0.66 0.70  CALCIUM 8.1* 7.8* 7.6* 8.1* 8.0*  MG  --   --   --  1.8  --     Liver Function Tests: Recent Labs  Lab 08/06/18 1200 08/07/18 0624 08/09/18 0923  AST 67* 45* 35  ALT 39 37 31  ALKPHOS 42 31* 33*  BILITOT 2.1* 1.1 0.7  PROT 6.8 4.7* 4.6*  ALBUMIN 3.5 2.4* 2.1*   No results for input(s): LIPASE, AMYLASE in the last 168 hours. No results for input(s): AMMONIA in the last 168 hours. Coagulation Profile: No results for input(s): INR, PROTIME in the last 168 hours. Cardiac Enzymes: No results for input(s): CKTOTAL, CKMB, CKMBINDEX, TROPONINI in the last 168 hours. BNP (last 3 results) No results for input(s): PROBNP in the last 8760 hours. CBG: Recent Labs  Lab 08/06/18 1129  GLUCAP 94   Studies: No results found.   Time spent: 35 minutes  Author: Lynden OxfordPranav Chananya Canizalez, MD [facility] Pager: [phone] 08/10/2018 6:35 PM  Between 7PM-7AM, please contact night-coverage at [email or web address], password [email or web address]

## 2018-08-11 LAB — CULTURE, BLOOD (ROUTINE X 2)
Culture: NO GROWTH
Culture: NO GROWTH
Special Requests: ADEQUATE

## 2018-08-11 LAB — BASIC METABOLIC PANEL
Anion gap: 11 (ref 5–15)
BUN: 7 mg/dL — ABNORMAL LOW (ref 8–23)
CHLORIDE: 106 mmol/L (ref 98–111)
CO2: 27 mmol/L (ref 22–32)
Calcium: 8.4 mg/dL — ABNORMAL LOW (ref 8.9–10.3)
Creatinine, Ser: 0.58 mg/dL (ref 0.44–1.00)
GFR calc Af Amer: >60 mL/min (ref >60)
GFR calc non Af Amer: >60 mL/min (ref >60)
Glucose, Bld: 92 mg/dL (ref 70–99)
Potassium: 3 mmol/L — ABNORMAL LOW (ref 3.5–5.1)
SODIUM: 144 mmol/L (ref 135–145)

## 2018-08-11 LAB — MRSA PCR SCREENING: MRSA BY PCR: NEGATIVE

## 2018-08-11 LAB — MAGNESIUM: Magnesium: 1.8 mg/dL (ref 1.7–2.4)

## 2018-08-11 MED ORDER — POTASSIUM CHLORIDE CRYS ER 20 MEQ PO TBCR
40.0000 meq | EXTENDED_RELEASE_TABLET | Freq: Once | ORAL | Status: AC
  Administered 2018-08-11: 40 meq via ORAL
  Filled 2018-08-11: qty 2

## 2018-08-11 MED ORDER — ENSURE ENLIVE PO LIQD: 237.0000 mL | Freq: Two times a day (BID) | ORAL | Status: DC

## 2018-08-11 NOTE — Progress Notes (Signed)
PT Cancellation Note  Patient Details Name: Deborah RocheJoanne W Routt MRN: [id number] DOB: 11/20/1945   Cancelled Treatment:    Reason Eval/Treat Not Completed: PT screened, no needs identified, will sign off Patient evaluated and discharged from PT caseload on 08/07/18. Per chart review, patient required total care at SNF, and is currently at baseline. This PT speaking with nursing staff with nursing noting no changes in functional status since prior evaluation. Noted Hospice has been initiated. PT to currently sign off. If further services are indicated, please re-consult.    Kipp LaurenceStephanie R Aaron, PT, DPT Supplemental Physical Therapist 08/11/18 8:53 AM Pager: [phone] Office: [phone]

## 2018-08-11 NOTE — Progress Notes (Signed)
Initial Nutrition Assessment  DOCUMENTATION CODES:   Not applicable  INTERVENTION:    Ensure Enlive po BID, each supplement provides 350 kcal and 20 grams of protein   If within goals of care, consider Cortrak feeding tube placement for short term nutrition support  NUTRITION DIAGNOSIS:   Inadequate oral intake related to (lethargy) as evidenced by meal completion < 25%  GOAL:   Patient will meet greater than or equal to 90% of their needs  MONITOR:   PO intake, Supplement acceptance, Labs, Weight trends, Skin, I & O's  REASON FOR ASSESSMENT:   Low Braden  ASSESSMENT:   72 yo Female with advanced dementia, prior CVA, COPD, bipolar, HTN, HLD ; admitted on 08/06/2018, presented with complaint of unresponsiveness, was found to have sepsis due to UTI.  Pt admitted from [facility] memory care unit. RD unable to obtain nutrition history from patient. She is sleepy. Lunch meal untouched on tray table.  S/p bedside swallow evaluation 12/7. SLP rec NPO status. Dysphagia treatment 12/9. Advanced to Dys 1-thin liquid diet. Spoke with RN who reports pt has been lethargic all day.  Noted pt is a [facility] patient. Ensure Enlive nutrition supplements ordered today. Labs & medications reviewed. K 3.0 (L).  NUTRITION - FOCUSED PHYSICAL EXAM:  Unable to complete at this time  Diet Order:   Diet Order            DIET - DYS 1 Room service appropriate? Yes; Fluid consistency: Thin  Diet effective now             EDUCATION NEEDS:   Not appropriate for education at this time  Skin:  Skin Assessment: Skin Integrity Issues: Skin Integrity Issues:: Other (Comment) Other: sacral wound  Last BM:  12/9  Height:   Ht Readings from Last 1 Encounters:  08/06/18 5\' 4"  (1.626 m)   Weight:   Wt Readings from Last 1 Encounters:  08/07/18 64.7 kg   Ideal Body Weight:  54.5 kg  BMI:  Body mass index is 24.48 kg/m.  Estimated Nutritional Needs:    Kcal:  1500-1700  Protein:  70-85 gm  Fluid:  1.5-1.7 L  Maureen ChattersKatie Judiann Celia, RD, LDN Pager #: [phone] After-Hours Pager #: [phone]

## 2018-08-11 NOTE — Progress Notes (Signed)
[facility] Progress Note  Patient: Deborah [name]:[id number]   PCP: Agapito Games, MD DOB: 10/17/1945   DOA: 08/06/2018   DOS: 08/11/2018   Date of Service: the patient was seen and examined on 08/11/2018  Brief hospital course: Pt. with PMH of  advanced dementia, prior CVA, COPD, bipolar, HTN, HLD ; admitted on 08/06/2018, presented with complaint of unresponsiveness, was found to have sepsis due to UTI. Currently further plan is continue IV antibiotics.  Subjective: No acute event overnight.  Still remains sleepy not following any commands.  No nausea no vomiting.  Assessment and Plan: 1. Severe sepsis secondary to UTI from E. coli 1. Presents with fevers, tachycardia, tachypnea, elevated lactic acid, and ARF sepsis physiology currently resolved 2. Pan cultures obtained, urine growing e coli,  3. Continue Antibiotics, will switch to oral for now 2. Acute toxic and metabolic encephalopathy 1. Likely secondary to combination severe dehydration with sepsis in setting of underlying dementia 2. Fluids is currently on hold.  Antibiotics as per above. 3. Acute kidney injury with hypernatremia 1. Clinically dehdrated with poor skin turgor and dry mucus membranes 2. Sodium is now normal, will hold IV fluid and monitor. 4. HTN 1. BP stable, albeit soft at this time 2. Hold metoprolol with hold parameters 3. Hold ARB secondary to ARF 5. HLD 1. Not on statin prior to admit 6. Dementia 1. Cont supportive care and tx as per above 2. Will continue QHS seroquel  3. Prognosis severely guarded as the patient remains at high risk for aspiration regardless of her progress as well as 4. Patient will be on dysphagia 1 diet thin liquids. 7. COPD 1. On minimal O2 support 2. No wheezing on auscultation 3. Continue PRN duoneb  8.  Goals of care discussion Patient is active with palliative care outpatient but not on hospice. Reportedly based on the discussion with the hospice  personnel, patient will be discharged with hospice services at memory care SON WANT TO CONTINUE TO TREAT WHAT IS TREATABLE.  Patient is currently n.p.o. due to inability to follow any commands consistently for safely passing swallow eval. I suspect dysphagia 1 diet with thin liquids still is significantly restricted which would lead to recurrent dehydration episode as well as aspiration events. This information was clearly relayed to son and informed that the patient's prognosis remains guarded secondary to the same.  Pressure Injury 08/07/18 (Active)  08/07/18 0300   Location: Sacrum  Location Orientation:   Staging:   Wound Description (Comments):   Present on Admission:      Diet: NPO DVT Prophylaxis: subcutaneous Heparin  Advance goals of care discussion: DNR DNI  Family Communication: family was present at bedside, at the time of interview.   Disposition:  Discharge to SNF with Palliative care support  Consultants: none Procedures: none  Scheduled Meds: . budesonide  0.25 mg Nebulization BID  . chlorhexidine  15 mL Mouth Rinse BID  . enoxaparin (LOVENOX) injection  40 mg Subcutaneous Q24H  . feeding supplement (ENSURE ENLIVE)  237 mL Oral BID BM  . mouth rinse  15 mL Mouth Rinse q12n4p  . potassium chloride  40 mEq Oral Once   Continuous Infusions:  PRN Meds: acetaminophen **OR** acetaminophen, albuterol Antibiotics: Anti-infectives (From admission, onward)   Start     Dose/Rate Route Frequency Ordered Stop   08/08/18 0900  ceFAZolin (ANCEF) IVPB 1 g/50 mL premix  Status:  Discontinued     1 g 100 mL/hr over 30 Minutes Intravenous Every  12 hours 08/08/18 0825 08/11/18 0753   08/08/18 0200  vancomycin (VANCOCIN) 1,250 mg in sodium chloride 0.9 % 250 mL IVPB  Status:  Discontinued     1,250 mg 166.7 mL/hr over 90 Minutes Intravenous Every 36 hours 08/06/18 1557 08/07/18 0757   08/07/18 1200  ceFEPIme (MAXIPIME) 1 g in sodium chloride 0.9 % 100 mL IVPB  Status:   Discontinued     1 g 200 mL/hr over 30 Minutes Intravenous Every 24 hours 08/06/18 1550 08/08/18 0758   08/06/18 1545  ceFEPIme (MAXIPIME) 2 g in sodium chloride 0.9 % 100 mL IVPB  Status:  Discontinued     2 g 200 mL/hr over 30 Minutes Intravenous STAT 08/06/18 1538 08/06/18 1538   08/06/18 1515  metroNIDAZOLE (FLAGYL) IVPB 500 mg  Status:  Discontinued     500 mg 100 mL/hr over 60 Minutes Intravenous Every 8 hours 08/06/18 1503 08/06/18 1529   08/06/18 1300  vancomycin (VANCOCIN) 1,750 mg in sodium chloride 0.9 % 500 mL IVPB     1,750 mg 250 mL/hr over 120 Minutes Intravenous  Once 08/06/18 1136 08/06/18 1832   08/06/18 1130  ceFEPIme (MAXIPIME) 2 g in sodium chloride 0.9 % 100 mL IVPB     2 g 200 mL/hr over 30 Minutes Intravenous  Once 08/06/18 1126 08/06/18 1242   08/06/18 1130  metroNIDAZOLE (FLAGYL) IVPB 500 mg  Status:  Discontinued     500 mg 100 mL/hr over 60 Minutes Intravenous Every 8 hours 08/06/18 1126 08/07/18 0757   08/06/18 1130  vancomycin (VANCOCIN) IVPB 1000 mg/200 mL premix  Status:  Discontinued     1,000 mg 200 mL/hr over 60 Minutes Intravenous  Once 08/06/18 1126 08/06/18 1136       Objective: Physical Exam: Vitals:   08/11/18 0749 08/11/18 0851 08/11/18 1111 08/11/18 1615  BP: (!) 114/54  (!) 112/58 116/66  Pulse: 84  93 82  Resp: 14  15 12   Temp: 98.2 F (36.8 C)  97.7 F (36.5 C) 97.9 F (36.6 C)  TempSrc:   Oral Oral  SpO2: 97% 100% 95% 97%  Weight:      Height:        Intake/Output Summary (Last 24 hours) at 08/11/2018 1648 Last data filed at 08/11/2018 0600 Gross per 24 hour  Intake 307.59 ml  Output 850 ml  Net -542.41 ml   Filed Weights   08/06/18 1500 08/07/18 0132  Weight: 77.1 kg 64.7 kg   General: Drowsy and lethargic, unable to follow any commands. Eyes: PERRL, Conjunctiva normal ENT: Oral Mucosa clear moist. Neck: no JVD, no Abnormal Mass Or lumps Cardiovascular: S1 and S2 Present, aortic systolic  Murmur, Peripheral Pulses  Present Respiratory: normal respiratory effort, Bilateral Air entry equal and Decreased, no use of accessory muscle, Clear to Auscultation, no Crackles, no wheezes Abdomen: Bowel Sound present, Soft and no tenderness, no hernia Skin: no redness, no Rash, no induration Extremities: no Pedal edema, no calf tenderness Neurologic: Grossly no focal neuro deficit  Data Reviewed: CBC: Recent Labs  Lab 08/06/18 1200 08/06/18 2014 08/07/18 0624 08/09/18 0923  WBC 14.7* 11.5* 9.3 6.8  NEUTROABS 10.7*  --   --  3.7  HGB 17.3* 14.1 12.8 12.8  HCT 56.8* 48.4* 43.2 41.2  MCV 96.4 98.4 97.5 94.5  PLT 351 243 225 205   Basic Metabolic Panel: Recent Labs  Lab 08/08/18 1336 08/08/18 1947 08/09/18 0923 08/10/18 1127 08/11/18 1058  NA 149* 146* 149* 143 144  K  3.0* 3.0* 3.8 3.2* 3.0*  CL 117* 112* 113* 105 106  CO2 27 27 29 29 27   GLUCOSE 130* 107* 116* 111* 92  BUN 16 13 11  6* 7*  CREATININE 0.63 0.58 0.66 0.70 0.58  CALCIUM 7.8* 7.6* 8.1* 8.0* 8.4*  MG  --   --  1.8  --  1.8    Liver Function Tests: Recent Labs  Lab 08/06/18 1200 08/07/18 0624 08/09/18 0923  AST 67* 45* 35  ALT 39 37 31  ALKPHOS 42 31* 33*  BILITOT 2.1* 1.1 0.7  PROT 6.8 4.7* 4.6*  ALBUMIN 3.5 2.4* 2.1*   No results for input(s): LIPASE, AMYLASE in the last 168 hours. No results for input(s): AMMONIA in the last 168 hours. Coagulation Profile: No results for input(s): INR, PROTIME in the last 168 hours. Cardiac Enzymes: No results for input(s): CKTOTAL, CKMB, CKMBINDEX, TROPONINI in the last 168 hours. BNP (last 3 results) No results for input(s): PROBNP in the last 8760 hours. CBG: Recent Labs  Lab 08/06/18 1129  GLUCAP 94   Studies: No results found.   Time spent: 35 minutes  Author: Lynden OxfordPranav Levert Heslop, MD [facility] Pager: [phone] 08/11/2018 4:48 PM  Between 7PM-7AM, please contact night-coverage at [email or web address], password [email or web address]

## 2018-08-12 DIAGNOSIS — Z515 Encounter for palliative care: Secondary | ICD-10-CM

## 2018-08-12 DIAGNOSIS — A419 Sepsis, unspecified organism: Secondary | ICD-10-CM

## 2018-08-12 DIAGNOSIS — F0150 Vascular dementia without behavioral disturbance: Secondary | ICD-10-CM

## 2018-08-12 DIAGNOSIS — R6520 Severe sepsis without septic shock: Secondary | ICD-10-CM

## 2018-08-12 LAB — CBC
HCT: 41.6 % (ref 36.0–46.0)
Hemoglobin: 13.2 g/dL (ref 12.0–15.0)
MCH: 28.5 pg (ref 26.0–34.0)
MCHC: 31.7 g/dL (ref 30.0–36.0)
MCV: 89.8 fL (ref 80.0–100.0)
Platelets: 297 10*3/uL (ref 150–400)
RBC: 4.63 MIL/uL (ref 3.87–5.11)
RDW: 14 % (ref 11.5–15.5)
WBC: 8.3 10*3/uL (ref 4.0–10.5)
nRBC: 0 % (ref 0.0–0.2)

## 2018-08-12 LAB — BASIC METABOLIC PANEL
Anion gap: 10 (ref 5–15)
BUN: 8 mg/dL (ref 8–23)
CHLORIDE: 106 mmol/L (ref 98–111)
CO2: 28 mmol/L (ref 22–32)
Calcium: 8.3 mg/dL — ABNORMAL LOW (ref 8.9–10.3)
Creatinine, Ser: 0.83 mg/dL (ref 0.44–1.00)
Glucose, Bld: 104 mg/dL — ABNORMAL HIGH (ref 70–99)
Potassium: 3.6 mmol/L (ref 3.5–5.1)
Sodium: 144 mmol/L (ref 135–145)

## 2018-08-12 MED ORDER — CEPHALEXIN 500 MG PO CAPS
500.0000 mg | ORAL_CAPSULE | Freq: Three times a day (TID) | ORAL | Status: AC
  Administered 2018-08-12 – 2018-08-14 (×6): 500 mg via ORAL
  Filled 2018-08-12 (×6): qty 1

## 2018-08-12 NOTE — Consult Note (Addendum)
Consultation Note Date: 08/12/2018   Patient Name: Deborah [name]  DOB: 06-21-1946  MRN: [id number]  Age / Sex: 72 y.o., female  PCP: Agapito Games, MD Referring Physician: Zannie Cove, MD  Reason for Consultation: Establishing goals of care  HPI/Patient Profile: 72 y.o. female  with past medical history of early on-set dementia, NASH, COPD, CVA, Bipolar DO who was admitted on 08/06/2018 with severe sepsis from UTI.  She is from ALF memory care.  She has been on Hospice in the past but graduated off and is now followed by [facility]. She has received 6 days of hospital therapy and antibiotics.  She is stable but barely eating/drinking or speaking.     Clinical Assessment and Goals of Care:  I have reviewed medical records including EPIC notes, labs and imaging, received report from Dr. Jomarie Longs, assessed the patient and then spoke on the phone with her son [name] to discuss diagnosis prognosis, GOC, EOL wishes, disposition and options.  I introduced Palliative Medicine as specialized medical care for people living with serious illness. It focuses on providing relief from the symptoms and stress of a serious illness. The goal is to improve quality of life for both the patient and the family.  We discussed a brief life review of the patient.  Approximately 11 years ago her husband died in hospice house from melanoma.  Just a couple of years after that [name] really began to decline from dementia.  She had a stroke and was placed in a skilled nursing facility.  At one point she was even on hospice but graduated off.  She has been at an ALF/memory care most recently.  Her outpatient doctor expressed concern to Brenae Lasecki that his mother was not eating, and the doctor recommended hospice services.  As far as functional and nutritional status [name] is in the fetal position and is unable to  relax and lay on her back.  Her eyes are open and she attempts to respond to me with low-volume mumbling.  She takes 3 small bites of applesauce and then stops.  She is unable to speak words.  Unable to lift her head.  And unable to support her hydration needs with p.o. intake.  We discussed her current illness and what it means in the larger context of her on-going co-morbidities.  Natural disease trajectory and expectations at EOL were discussed.  Mickeal Skinner understands that no longer eating and drinking means his mother is very near end-of-life.  He is saddened by this, but wants her well cared for.  The idea of hospice house makes [name] apprehensive because his father died there within a week and a half.  I explained to [name] that the primary purpose of hospice house is to have a peaceful comfortable death and that most people admitted there die within 2 weeks.  We discussed the very high level of care provided at hospice house.  We also discussed his mother's inability to participate in rehab at a skilled nursing facility.  [name] appreciated the information and took down my phone number.  He has 4 sisters that he needs to converse with before making decisions even though he is his mother's legal guardian.  Of note, 1 of the daughters lives in [place].  [name] would like to talk again tomorrow.   Primary Decision Maker:  LEGAL GUARDIAN, Mickeal Skinner    SUMMARY OF RECOMMENDATIONS    Continue current care with a focus on comfort. [name] and his sisters may decide to accept hospice house for discharge or they may ask if the ALF will take Felesha back with hospice services.  Either way I believe he wants hospice on discharge.  He understands his mother is near end-of-life.   Code Status/Advance Care Planning:  DNR   Symptom Management:   Per primary team.  Patient appears comfortable  Palliative Prophylaxis:   Aspiration precautions, delirium precautions  Psycho-social/Spiritual:    Desire for further Chaplaincy support: Not requested   Prognosis: Less than 2 weeks as she is not taking in hydration or nutrition  Discharge Planning: Hospice house is recommended.  I anticipate the family will give [place] their final decision in the next 24 to 48 hours.  Discharging to SNF would result in a quick readmission.     Primary Diagnoses: Present on Admission: . 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 Moderate dementia (HCC) . Irritable bowel syndrome . Hypertension . Hyperlipidemia . COPD (chronic obstructive pulmonary disease) (HCC) . Acute encephalopathy . Sepsis (HCC)   I have reviewed the medical record, interviewed the patient and family, and examined the patient. The following aspects are pertinent.  Past Medical History:  Diagnosis Date  . Abscess of face 05/06/2014  . Bipolar disorder (HCC)    [name], NP  . Cardiomegaly   . Chronic headache   . COPD (chronic obstructive pulmonary disease) (HCC)   . Dementia (HCC)   . Depression   . Diverticulosis of colon (without mention of hemorrhage)   . Esophageal reflux   . Family history of colon cancer   . Gout flare    Left toe  . Headache(784.0)   . Hepatomegaly   . Hiatal hernia   . Hyperlipidemia   . Hypertension   . Irritable bowel syndrome   . Nausea   . Other chronic nonalcoholic liver disease   . Other specified disorders of liver   . Peripheral neuropathy   . PLMD (periodic limb movement disorder)    Social History   Socioeconomic History  . Marital status: Widowed    Spouse name: Not on file  . Number of children: 5  . Years of education: Not on file  . Highest education level: Not on file  Occupational History  . Occupation: Retired    Associate Professor: RETIRED    Comment: Administrator, sports  Social Needs  . Financial resource strain: Not on file  . Food insecurity:    Worry: Not on file    Inability: Not on file  . Transportation needs:    Medical: Not on file    Non-medical:  Not on file  Tobacco Use  . Smoking status: Never Smoker  . Smokeless tobacco: Never Used  Substance and Sexual Activity  . Alcohol use: No  . Drug use: No  . Sexual activity: Never  Lifestyle  . Physical activity:    Days per week: Not on file    Minutes per session: Not on file  . Stress: Not on file  Relationships  . Social connections:  Talks on phone: Not on file    Gets together: Not on file    Attends religious service: Not on file    Active member of club or organization: Not on file    Attends meetings of clubs or organizations: Not on file    Relationship status: Not on file  Other Topics Concern  . Not on file  Social History Narrative   Widowed.  SNF resident.  Wheelchair bound.      Very little caffeine but will drink it.    Family History  Problem Relation Age of Onset  . Emphysema Mother   . Heart disease Father   . Mental illness Father        Bipolar disorder  . Alzheimer's disease Brother   . Colon cancer Maternal Grandfather   . Bipolar disorder Brother    Scheduled Meds: . budesonide  0.25 mg Nebulization BID  . cephALEXin  500 mg Oral Q8H  . chlorhexidine  15 mL Mouth Rinse BID  . enoxaparin (LOVENOX) injection  40 mg Subcutaneous Q24H  . feeding supplement (ENSURE ENLIVE)  237 mL Oral BID BM  . mouth rinse  15 mL Mouth Rinse q12n4p   Continuous Infusions: PRN Meds:.acetaminophen **OR** acetaminophen, albuterol Allergies  Allergen Reactions  . Bee Venom Anaphylaxis  . Dicyclomine Hcl Anaphylaxis  . Omeprazole Other (See Comments)     GI upset.  . Other Other (See Comments)    Reaction to reductase inhibitors per MAR  . Statins Other (See Comments)    Myalgias    Review of Systems patient demented unable to speak intelligibly  Physical Exam  Frail, demented female.  Eyes open in the fetal position.  Rigid.  Nonverbal other than a few mumbles CV regular rate and rhythm Respiratory no distress Abdomen soft is  nondistended   Vital Signs: BP (!) 113/48   Pulse 92   Temp 99.1 F (37.3 C) (Oral)   Resp 19   Ht 5\' 4"  (1.626 m)   Wt 64.7 kg   SpO2 96%   BMI 24.48 kg/m  Pain Scale: 0-10 POSS *See Group Information*: 2-Acceptable,Slightly drowsy, easily aroused Pain Score: 0-No pain   SpO2: SpO2: 96 % O2 Device:SpO2: 96 % O2 Flow Rate: .O2 Flow Rate (L/min): 2 L/min  IO: Intake/output summary:   Intake/Output Summary (Last 24 hours) at 08/12/2018 1457 Last data filed at 08/12/2018 1400 Gross per 24 hour  Intake 60 ml  Output 260 ml  Net -200 ml    LBM: Last BM Date: 08/11/18 Baseline Weight: Weight: 77.1 kg Most recent weight: Weight: 64.7 kg     Palliative Assessment/Data: 20%     Time In: 245 Time Out: 345 Time Total: 60 minutes Greater than 50%  of this time was spent counseling and coordinating care related to the above assessment and plan.  Signed by: Norvel RichardsMarianne Verlyn Dannenberg, PA-C Palliative Medicine Pager: [phone]  Please contact Palliative Medicine Team phone at [phone] for questions and concerns.  For individual provider: See Loretha StaplerAmion

## 2018-08-12 NOTE — Progress Notes (Signed)
[facility] Progress Note  Patient: Deborah [name] EAV:[id number]   PCP: Agapito Games, MD DOB: August 06, 1946   DOA: 08/06/2018   DOS: 08/12/2018   Date of Service: the patient was seen and examined on 08/12/2018  Brief hospital course: Pt. with PMH of  advanced dementia, prior CVA, COPD, bipolar, HTN, HLD ; admitted on 08/06/2018, presented with complaint of unresponsiveness, was found to have sepsis due to UTI.  Subjective: No events overnight, remains poorly responsive, without any p.o. intake  Assessment and Plan:  Severe sepsis secondary to UTI  -Urine culture grew pansensitive E. coli  -Was on IV ceftriaxone initially this was transitioned to oral antibiotics yesterday -Continue amoxicillin to complete 7-day course  Toxic metabolic encephalopathy -Worsened in the setting of sepsis -Has advanced dementia at baseline  Advanced dementia -Poor prognosis, with dysphagia -Seen by speech therapy earlier this admission currently on pured diet (dysphagia 1) -However oral intake is negligible at this time -Followed by palliative care of [place] -Needs goals of care meeting due to worsening of clinical status -Palliative medicine consulted for goals of care meeting will attempt to discuss with son later today  Acute kidney injury -Resolved with hydration  Hypertension -ARB on hold, resume beta-blocker in 1 to 2 days if blood pressure tolerates  COPD/respiratory failure -Home O2 at baseline -Stable continue duo nebs PRN  Depression anxiety/bipolar disorder -Depakote, lorazepam, Seroquel on hold due to decreased responsiveness  History of hemorrhagic stroke   DVT Prophylaxis: subcutaneous Heparin  Advance goals of care discussion: DNR DNI  Family Communication:   No family was present at bedside  Disposition: may need residential hospice  Consultants: none Procedures: none  Scheduled Meds: . budesonide  0.25 mg Nebulization BID  . cephALEXin  500 mg  Oral Q8H  . chlorhexidine  15 mL Mouth Rinse BID  . enoxaparin (LOVENOX) injection  40 mg Subcutaneous Q24H  . feeding supplement (ENSURE ENLIVE)  237 mL Oral BID BM  . mouth rinse  15 mL Mouth Rinse q12n4p   Continuous Infusions:  PRN Meds: acetaminophen **OR** acetaminophen, albuterol Antibiotics: Anti-infectives (From admission, onward)   Start     Dose/Rate Route Frequency Ordered Stop   08/12/18 1400  cephALEXin (KEFLEX) capsule 500 mg     500 mg Oral Every 8 hours 08/12/18 1328 08/14/18 1359   08/08/18 0900  ceFAZolin (ANCEF) IVPB 1 g/50 mL premix  Status:  Discontinued     1 g 100 mL/hr over 30 Minutes Intravenous Every 12 hours 08/08/18 0825 08/11/18 0753   08/08/18 0200  vancomycin (VANCOCIN) 1,250 mg in sodium chloride 0.9 % 250 mL IVPB  Status:  Discontinued     1,250 mg 166.7 mL/hr over 90 Minutes Intravenous Every 36 hours 08/06/18 1557 08/07/18 0757   08/07/18 1200  ceFEPIme (MAXIPIME) 1 g in sodium chloride 0.9 % 100 mL IVPB  Status:  Discontinued     1 g 200 mL/hr over 30 Minutes Intravenous Every 24 hours 08/06/18 1550 08/08/18 0758   08/06/18 1545  ceFEPIme (MAXIPIME) 2 g in sodium chloride 0.9 % 100 mL IVPB  Status:  Discontinued     2 g 200 mL/hr over 30 Minutes Intravenous STAT 08/06/18 1538 08/06/18 1538   08/06/18 1515  metroNIDAZOLE (FLAGYL) IVPB 500 mg  Status:  Discontinued     500 mg 100 mL/hr over 60 Minutes Intravenous Every 8 hours 08/06/18 1503 08/06/18 1529   08/06/18 1300  vancomycin (VANCOCIN) 1,750 mg in sodium chloride 0.9 % 500  mL IVPB     1,750 mg 250 mL/hr over 120 Minutes Intravenous  Once 08/06/18 1136 08/06/18 1832   08/06/18 1130  ceFEPIme (MAXIPIME) 2 g in sodium chloride 0.9 % 100 mL IVPB     2 g 200 mL/hr over 30 Minutes Intravenous  Once 08/06/18 1126 08/06/18 1242   08/06/18 1130  metroNIDAZOLE (FLAGYL) IVPB 500 mg  Status:  Discontinued     500 mg 100 mL/hr over 60 Minutes Intravenous Every 8 hours 08/06/18 1126 08/07/18 0757    08/06/18 1130  vancomycin (VANCOCIN) IVPB 1000 mg/200 mL premix  Status:  Discontinued     1,000 mg 200 mL/hr over 60 Minutes Intravenous  Once 08/06/18 1126 08/06/18 1136       Objective: Physical Exam: Vitals:   08/11/18 1615 08/11/18 2304 08/12/18 0835 08/12/18 0855  BP: 116/66 125/78 (!) 113/48   Pulse: 82 (!) 125 92   Resp: 12 19    Temp: 97.9 F (36.6 C) 98.9 F (37.2 C) 99.1 F (37.3 C)   TempSrc: Oral Oral Oral   SpO2: 97% 93% 97% 96%  Weight:      Height:        Intake/Output Summary (Last 24 hours) at 08/12/2018 1416 Last data filed at 08/12/2018 0645 Gross per 24 hour  Intake 0 ml  Output 60 ml  Net -60 ml   Filed Weights   08/06/18 1500 08/07/18 0132  Weight: 77.1 kg 64.7 kg   Gen: Obtunded, minimally responsive, would not follow any commands HEENT: PERRLA, Neck supple, no JVD Lungs: Poor air movement, otherwise clear CVS: RRR,No Gallops,Rubs or new Murmurs Abd: soft, Non tender, non distended, BS present Extremities: No edema Skin: no new rashes Neuro left arm appears contracted, exam limited, would not follow any commands   Data Reviewed: CBC: Recent Labs  Lab 08/06/18 1200 08/06/18 2014 08/07/18 0624 08/09/18 0923 08/12/18 0259  WBC 14.7* 11.5* 9.3 6.8 8.3  NEUTROABS 10.7*  --   --  3.7  --   HGB 17.3* 14.1 12.8 12.8 13.2  HCT 56.8* 48.4* 43.2 41.2 41.6  MCV 96.4 98.4 97.5 94.5 89.8  PLT 351 243 225 205 297   Basic Metabolic Panel: Recent Labs  Lab 08/08/18 1947 08/09/18 0923 08/10/18 1127 08/11/18 1058 08/12/18 0259  NA 146* 149* 143 144 144  K 3.0* 3.8 3.2* 3.0* 3.6  CL 112* 113* 105 106 106  CO2 27 29 29 27 28   GLUCOSE 107* 116* 111* 92 104*  BUN 13 11 6* 7* 8  CREATININE 0.58 0.66 0.70 0.58 0.83  CALCIUM 7.6* 8.1* 8.0* 8.4* 8.3*  MG  --  1.8  --  1.8  --     Liver Function Tests: Recent Labs  Lab 08/06/18 1200 08/07/18 0624 08/09/18 0923  AST 67* 45* 35  ALT 39 37 31  ALKPHOS 42 31* 33*  BILITOT 2.1* 1.1 0.7    PROT 6.8 4.7* 4.6*  ALBUMIN 3.5 2.4* 2.1*   No results for input(s): LIPASE, AMYLASE in the last 168 hours. No results for input(s): AMMONIA in the last 168 hours. Coagulation Profile: No results for input(s): INR, PROTIME in the last 168 hours. Cardiac Enzymes: No results for input(s): CKTOTAL, CKMB, CKMBINDEX, TROPONINI in the last 168 hours. BNP (last 3 results) No results for input(s): PROBNP in the last 8760 hours. CBG: Recent Labs  Lab 08/06/18 1129  GLUCAP 94   Studies: No results found.   Time spent: 35 minutes  Zannie Cove,  MD [facility]  08/12/2018 2:16 PM  Between 7PM-7AM, please contact night-coverage at [email or web address], password [email or web address]

## 2018-08-13 DIAGNOSIS — E860 Dehydration: Secondary | ICD-10-CM

## 2018-08-13 DIAGNOSIS — Z7189 Other specified counseling: Secondary | ICD-10-CM

## 2018-08-13 LAB — CBC
HCT: 42.4 % (ref 36.0–46.0)
Hemoglobin: 13.3 g/dL (ref 12.0–15.0)
MCH: 28.6 pg (ref 26.0–34.0)
MCHC: 31.4 g/dL (ref 30.0–36.0)
MCV: 91.2 fL (ref 80.0–100.0)
PLATELETS: 319 10*3/uL (ref 150–400)
RBC: 4.65 MIL/uL (ref 3.87–5.11)
RDW: 14.4 % (ref 11.5–15.5)
WBC: 8.7 10*3/uL (ref 4.0–10.5)
nRBC: 0 % (ref 0.0–0.2)

## 2018-08-13 LAB — BASIC METABOLIC PANEL
ANION GAP: 10 (ref 5–15)
BUN: 10 mg/dL (ref 8–23)
CALCIUM: 8.1 mg/dL — AB (ref 8.9–10.3)
CO2: 25 mmol/L (ref 22–32)
Chloride: 111 mmol/L (ref 98–111)
Creatinine, Ser: 0.61 mg/dL (ref 0.44–1.00)
GFR calc Af Amer: >60 mL/min (ref >60)
Glucose, Bld: 125 mg/dL — ABNORMAL HIGH (ref 70–99)
POTASSIUM: 3.6 mmol/L (ref 3.5–5.1)
Sodium: 146 mmol/L — ABNORMAL HIGH (ref 135–145)

## 2018-08-13 NOTE — Care Management Important Message (Signed)
Important Message  Patient Details  Name: Deborah [name] MRN: [id number] Date of Birth: October 02, 1945   Medicare Important Message Given:  Yes    [name] Stefan ChurchBratton 08/13/2018, 3:55 PM

## 2018-08-13 NOTE — Plan of Care (Signed)
Discussed in front of patient p0lan of care for the evening and comfort measures with no evidence of teachback

## 2018-08-13 NOTE — Progress Notes (Addendum)
[facility] Progress Note  Patient: Deborah [name] ZOX:[id number]   PCP: Agapito Games, MD DOB: Aug 06, 1946   DOA: 08/06/2018   DOS: 08/13/2018   Date of Service: the patient was seen and examined on 08/13/2018  Brief hospital course: Pt. with PMH of  advanced dementia, prior CVA, COPD, bipolar, HTN, HLD ; admitted on 08/06/2018, presented with complaint of unresponsiveness, was found to have sepsis due to UTI.  Subjective: Remains poorly responsive, oral intake is poor, had small percentage of dinner and lunch yesterday  Assessment and Plan:  Severe sepsis secondary to UTI  -Urine culture grew pansensitive E. coli  -Blood cultures are negative -Was on IV ceftriaxone initially this was transitioned to oral antibiotics 12/10 -Continue keflex to complete 7-day course  Toxic metabolic encephalopathy -Worsened in the setting of sepsis -Has advanced dementia at baseline  Advanced dementia -Poor prognosis, with dysphagia -Seen by speech therapy earlier this admission currently on pured diet (dysphagia 1) -However oral intake is poor and remains minimally responsive -Followed by palliative care of [place] -Appreciate palliative discussion by Clerance Lav -I called and discussed very poor prognosis with son [name], high risk of aspiration, dehydration, ongoing decline, skin break breakdown, worsening malnutrition in the background of advanced dementia, CVA, COPD, recommended consideration of hospice services and comfort focused care at discharge  Acute kidney injury -Resolved with hydration -Sodium starting to trend up again  Hypertension -ARB on hold, resume beta-blocker in 1 to 2 days if blood pressure tolerates  COPD/respiratory failure -Home O2 at baseline -Stable continue duo nebs PRN  Depression anxiety/bipolar disorder -Depakote, lorazepam, Seroquel on hold due to decreased responsiveness  History of hemorrhagic stroke   DVT Prophylaxis: subcutaneous  Heparin  Advance goals of care discussion: DNR DNI  Family Communication:   No family was present at bedside, also discussed poor prognosis with son [name]  Disposition: may need residential hospice  Consultants: none Procedures: none  Scheduled Meds: . budesonide  0.25 mg Nebulization BID  . cephALEXin  500 mg Oral Q8H  . chlorhexidine  15 mL Mouth Rinse BID  . enoxaparin (LOVENOX) injection  40 mg Subcutaneous Q24H  . feeding supplement (ENSURE ENLIVE)  237 mL Oral BID BM  . mouth rinse  15 mL Mouth Rinse q12n4p   Continuous Infusions:  PRN Meds: acetaminophen **OR** acetaminophen, albuterol Antibiotics: Anti-infectives (From admission, onward)   Start     Dose/Rate Route Frequency Ordered Stop   08/12/18 1400  cephALEXin (KEFLEX) capsule 500 mg     500 mg Oral Every 8 hours 08/12/18 1328 08/14/18 1359   08/08/18 0900  ceFAZolin (ANCEF) IVPB 1 g/50 mL premix  Status:  Discontinued     1 g 100 mL/hr over 30 Minutes Intravenous Every 12 hours 08/08/18 0825 08/11/18 0753   08/08/18 0200  vancomycin (VANCOCIN) 1,250 mg in sodium chloride 0.9 % 250 mL IVPB  Status:  Discontinued     1,250 mg 166.7 mL/hr over 90 Minutes Intravenous Every 36 hours 08/06/18 1557 08/07/18 0757   08/07/18 1200  ceFEPIme (MAXIPIME) 1 g in sodium chloride 0.9 % 100 mL IVPB  Status:  Discontinued     1 g 200 mL/hr over 30 Minutes Intravenous Every 24 hours 08/06/18 1550 08/08/18 0758   08/06/18 1545  ceFEPIme (MAXIPIME) 2 g in sodium chloride 0.9 % 100 mL IVPB  Status:  Discontinued     2 g 200 mL/hr over 30 Minutes Intravenous STAT 08/06/18 1538 08/06/18 1538   08/06/18 1515  metroNIDAZOLE (  FLAGYL) IVPB 500 mg  Status:  Discontinued     500 mg 100 mL/hr over 60 Minutes Intravenous Every 8 hours 08/06/18 1503 08/06/18 1529   08/06/18 1300  vancomycin (VANCOCIN) 1,750 mg in sodium chloride 0.9 % 500 mL IVPB     1,750 mg 250 mL/hr over 120 Minutes Intravenous  Once 08/06/18 1136 08/06/18 1832    08/06/18 1130  ceFEPIme (MAXIPIME) 2 g in sodium chloride 0.9 % 100 mL IVPB     2 g 200 mL/hr over 30 Minutes Intravenous  Once 08/06/18 1126 08/06/18 1242   08/06/18 1130  metroNIDAZOLE (FLAGYL) IVPB 500 mg  Status:  Discontinued     500 mg 100 mL/hr over 60 Minutes Intravenous Every 8 hours 08/06/18 1126 08/07/18 0757   08/06/18 1130  vancomycin (VANCOCIN) IVPB 1000 mg/200 mL premix  Status:  Discontinued     1,000 mg 200 mL/hr over 60 Minutes Intravenous  Once 08/06/18 1126 08/06/18 1136       Objective: Physical Exam: Vitals:   08/12/18 1645 08/12/18 2117 08/13/18 0828 08/13/18 0842  BP: 114/80   (!) 141/83  Pulse: (!) 108   (!) 105  Resp:    12  Temp: 98.2 F (36.8 C)   (!) 97.5 F (36.4 C)  TempSrc: Oral   Oral  SpO2: 97% 92% 92% 93%  Weight:      Height:        Intake/Output Summary (Last 24 hours) at 08/13/2018 1255 Last data filed at 08/13/2018 0600 Gross per 24 hour  Intake 120 ml  Output 350 ml  Net -230 ml   Filed Weights   08/06/18 1500 08/07/18 0132  Weight: 77.1 kg 64.7 kg   Gen: Obtunded, minimally responsive to deep painful stimuli only HEENT: Pupils are reactive Lungs: Poor air movement, otherwise clear  CVS: RRR,No Gallops,Rubs or new Murmurs Abd: soft, Non tender, non distended, BS present Extremities: No edema, both upper and lower extremities are contracted and stiff Skin: no new rashes Neuro: Obtunded, poorly responsive, would not follow any commands, contracted upper and lower extremities  Data Reviewed: CBC: Recent Labs  Lab 08/06/18 2014 08/07/18 0624 08/09/18 0923 08/12/18 0259 08/13/18 0321  WBC 11.5* 9.3 6.8 8.3 8.7  NEUTROABS  --   --  3.7  --   --   HGB 14.1 12.8 12.8 13.2 13.3  HCT 48.4* 43.2 41.2 41.6 42.4  MCV 98.4 97.5 94.5 89.8 91.2  PLT 243 225 205 297 319   Basic Metabolic Panel: Recent Labs  Lab 08/09/18 0923 08/10/18 1127 08/11/18 1058 08/12/18 0259 08/13/18 0321  NA 149* 143 144 144 146*  K 3.8 3.2* 3.0*  3.6 3.6  CL 113* 105 106 106 111  CO2 29 29 27 28 25   GLUCOSE 116* 111* 92 104* 125*  BUN 11 6* 7* 8 10  CREATININE 0.66 0.70 0.58 0.83 0.61  CALCIUM 8.1* 8.0* 8.4* 8.3* 8.1*  MG 1.8  --  1.8  --   --     Liver Function Tests: Recent Labs  Lab 08/07/18 0624 08/09/18 0923  AST 45* 35  ALT 37 31  ALKPHOS 31* 33*  BILITOT 1.1 0.7  PROT 4.7* 4.6*  ALBUMIN 2.4* 2.1*   No results for input(s): LIPASE, AMYLASE in the last 168 hours. No results for input(s): AMMONIA in the last 168 hours. Coagulation Profile: No results for input(s): INR, PROTIME in the last 168 hours. Cardiac Enzymes: No results for input(s): CKTOTAL, CKMB, CKMBINDEX, TROPONINI in  the last 168 hours. BNP (last 3 results) No results for input(s): PROBNP in the last 8760 hours. CBG: No results for input(s): GLUCAP in the last 168 hours. Studies: No results found.   Time spent: 35 minutes  Zannie Cove, MD [facility]  08/13/2018 12:55 PM  Between 7PM-7AM, please contact night-coverage at [email or web address], password [facility]

## 2018-08-13 NOTE — Progress Notes (Signed)
Daily Progress Note   Patient Name: Deborah RocheJoanne W [name]       Date: 08/13/2018 DOB: 1945-10-26  Age: 72 y.o. MRN#: [id number] Attending Physician: Deborah CoveJoseph, Preetha, MD Primary Care Physician: Agapito GamesMetheney, Deborah D, MD Admit Date: 08/06/2018  Reason for Consultation/Follow-up: Establishing goals of care and Hospice Evaluation  Subjective: Patient not interactive, per nursing minimal PO intake - a couple of small sips/bites for breakfast and none sense  Length of Stay: 7  Current Medications: Scheduled Meds:  . budesonide  0.25 mg Nebulization BID  . cephALEXin  500 mg Oral Q8H  . chlorhexidine  15 mL Mouth Rinse BID  . enoxaparin (LOVENOX) injection  40 mg Subcutaneous Q24H  . feeding supplement (ENSURE ENLIVE)  237 mL Oral BID BM  . mouth rinse  15 mL Mouth Rinse q12n4p    Continuous Infusions:   PRN Meds: acetaminophen **OR** acetaminophen, albuterol  Physical Exam Constitutional:      General: She is not in acute distress.    Comments: Not interactive, lying in fetal position  HENT:     Head: Normocephalic and atraumatic.  Cardiovascular:     Rate and Rhythm: Normal rate and regular rhythm.  Pulmonary:     Effort: Pulmonary effort is normal.  Abdominal:     Palpations: Abdomen is soft.  Neurological:     Mental Status: She is disoriented.             Vital Signs: BP (!) 141/83 (BP Location: Left Arm)   Pulse (!) 105   Temp (!) 97.5 F (36.4 C) (Oral)   Resp 12   Ht 5\' 4"  (1.626 m)   Wt 64.7 kg   SpO2 93%   BMI 24.48 kg/m  SpO2: SpO2: 93 % O2 Device: O2 Device: Room Air O2 Flow Rate: O2 Flow Rate (L/min): 2 L/min  Intake/output summary:   Intake/Output Summary (Last 24 hours) at 08/13/2018 1611 Last data filed at 08/13/2018 0600 Gross per 24 hour  Intake 60 ml    Output 150 ml  Net -90 ml   LBM: Last BM Date: 08/11/18 Baseline Weight: Weight: 77.1 kg Most recent weight: Weight: 64.7 kg       Palliative Assessment/Data: PPS 20%    Flowsheet Rows     Most Recent Value  Intake Tab  Referral Department  Hospitalist  Unit at Time of Referral  Intermediate Care Unit  Palliative Care Primary Diagnosis  Other (Comment) [on-set dementia]  Date Notified  08/12/18  Palliative Care Type  New Palliative care  Reason for referral  Clarify Goals of Care  Date of Admission  08/06/18  Date first seen by Palliative Care  08/12/18  # of days Palliative referral response time  0 Day(s)  # of days IP prior to Palliative referral  6  Clinical Assessment  Psychosocial & Spiritual Assessment  Palliative Care Outcomes      Patient Active Problem List   Diagnosis Date Noted  . Palliative care encounter   . Dehydration 08/06/2018  . Severe sepsis (HCC) 08/06/2018  . Sepsis (HCC) 08/06/2018  . Acute blood loss anemia 12/05/2016  . Hypertension   . Hyperlipidemia   . Dementia (HCC)   . COPD (  chronic obstructive pulmonary disease) (HCC)   . Acute encephalopathy   . Hypokalemia   . HCAP (healthcare-associated pneumonia) 03/31/2015  . HLD (hyperlipidemia) 05/18/2014  . Stroke due to intracerebral hemorrhage (HCC) 05/14/2014  . Stroke (HCC) 05/14/2014  . Stress incontinence 04/20/2013  . Moderate dementia (HCC) 04/20/2013  . PLMD (periodic limb movement disorder) 03/01/2013  . Hypoxia, sleep related 07/05/2011  . ACQUIRED HEMOLYTIC ANEMIA UNSPECIFIED 08/08/2010  . HYPERGLYCEMIA 06/01/2010  . HYPONATREMIA 04/11/2010  . HYPERLIPIDEMIA 03/24/2010  . PULMONARY NODULE 11/24/2009  . FATTY LIVER DISEASE 03/01/2009  . HEPATOMEGALY 03/01/2009  . 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 ASTHMA 09/17/2008  . 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 HYPERCHOLESTEROLEMIA 10/28/2007  . Bipolar disorder (HCC) 10/28/2007  . Essential hypertension 10/28/2007  .  CARDIOMEGALY 10/28/2007  . ALLERGIC RHINITIS CAUSE UNSPECIFIED 10/28/2007  . COPD 10/28/2007  . HIATAL HERNIA 10/28/2007  . DIVERTICULAR DISEASE 10/28/2007  . Irritable bowel syndrome 10/28/2007  . HEPATIC CYST 10/28/2007  . DEGENERATIVE JOINT DISEASE 10/28/2007  . HEADACHE, CHRONIC 10/28/2007  . ARRHYTHMIA, HX OF 10/28/2007    Palliative Care Assessment & Plan   HPI: 72 y.o. female  with past medical history of early on-set dementia, NASH, COPD, CVA, Bipolar DO who was admitted on 08/06/2018 with severe sepsis from UTI.  She is from ALF memory care.  She has been on Hospice in the past but graduated off and is now followed by [facility]. She has received 6 days of hospital therapy and antibiotics.  She is stable but barely eating/drinking or speaking.  Not interactive. PMT consulted for GOC/hospice evaluation.  Assessment: Follow up with patient and son today. Patient is not interactive. Spoke with son, [name], regarding patient's status. Per nursing staff, PO intake even less today than yesterday - a few sips this AM but none since. Does not accept PO intake on attempts by nursing staff. Shared this with [name] - he was saddened as he was hopeful intake had improved. He has good understanding of poor prognosis and understands she is approaching EOL. He tells me he is "leaning towards" hospice care. We discussed hospice care at ALF vs hospice home. Made recommendation that she may need higher level of care than can be provided at ALF - he agrees and tells me he thinks hospice home may be most appropriate. However, he tells me this decision is not "100%". He tells me patient has 4 daughters he is trying to include in decision making and there is some difficulty contacting them. He is hopeful to have decision by tomorrow. Discussed that I would follow up with him in AM. We discussed hospice philosophy. He agrees that focusing on her comfort and dignity is most appropriate at this point.    Recommendations/Plan:  Continue current care - elevate comfort above aggressive measures  [name] agrees comfort care and hospice is most appropriate at this point but is reluctant to make decision to transition to comfort care/hospice home until he hears from patient's 4 daughters (some difficulty contacting them) - he is hopeful to have decision tomorrow   Will follow up with [name] early tomorrow  Code Status:  DNR  Prognosis:   < 2 weeks d/t PO intake  Discharge Planning:  To Be Determined  Care plan was discussed with son, [name] and Dr. Jomarie Longs  Thank you for allowing the Palliative Medicine Team to assist in the care of this patient.   Total Time 25 minutes Prolonged Time Billed  no  Greater than 50%  of this time was spent counseling and coordinating care related to the above assessment and plan.  Juel Burrow, DNP, [facility] Palliative Medicine Team Team Phone # [phone]  Pager [phone]

## 2018-08-14 DIAGNOSIS — G9340 Encephalopathy, unspecified: Secondary | ICD-10-CM

## 2018-08-14 LAB — BASIC METABOLIC PANEL
Anion gap: 10 (ref 5–15)
BUN: 9 mg/dL (ref 8–23)
CO2: 28 mmol/L (ref 22–32)
Calcium: 8.2 mg/dL — ABNORMAL LOW (ref 8.9–10.3)
Chloride: 108 mmol/L (ref 98–111)
Creatinine, Ser: 0.76 mg/dL (ref 0.44–1.00)
GFR calc Af Amer: >60 mL/min (ref >60)
GFR calc non Af Amer: >60 mL/min (ref >60)
Glucose, Bld: 127 mg/dL — ABNORMAL HIGH (ref 70–99)
Potassium: 3.6 mmol/L (ref 3.5–5.1)
Sodium: 146 mmol/L — ABNORMAL HIGH (ref 135–145)

## 2018-08-14 LAB — CBC
HCT: 42.9 % (ref 36.0–46.0)
Hemoglobin: 13.8 g/dL (ref 12.0–15.0)
MCH: 29.1 pg (ref 26.0–34.0)
MCHC: 32.2 g/dL (ref 30.0–36.0)
MCV: 90.5 fL (ref 80.0–100.0)
Platelets: 306 10*3/uL (ref 150–400)
RBC: 4.74 MIL/uL (ref 3.87–5.11)
RDW: 14.6 % (ref 11.5–15.5)
WBC: 9.8 10*3/uL (ref 4.0–10.5)
nRBC: 0 % (ref 0.0–0.2)

## 2018-08-14 MED ORDER — MORPHINE SULFATE 20 MG/5ML PO SOLN: 5.0000 mg | ORAL | 0 refills | Status: AC | PRN

## 2018-08-14 MED ORDER — LORAZEPAM 2 MG/ML PO CONC: 1.0000 mg | ORAL | 0 refills | Status: AC | PRN

## 2018-08-14 MED ORDER — ACETAMINOPHEN 325 MG PO TABS: 650.0000 mg | ORAL_TABLET | Freq: Four times a day (QID) | ORAL | Status: AC | PRN

## 2018-08-14 NOTE — Progress Notes (Signed)
SLP Cancellation Note  Patient Details Name: Deborah [name] MRN: [id number] DOB: 03-31-1946   Cancelled treatment:       Reason Eval/Treat Not Completed: Other (comment) Per discussion with MD, pt is transitioning to comfort care and residential hospice is being pursued. No further SLP f/u indicated. Will sign off.   Maxcine Hamaiewonsky, Yarel Rushlow 08/14/2018, 10:53 AM  Maxcine HamLaura Paiewonsky, M.A. CCC-SLP [facility] Pager [phone] Office [phone]

## 2018-08-14 NOTE — Social Work (Signed)
Clinical Social Worker facilitated patient discharge including contacting patient family and facility to confirm patient discharge plans.  Clinical information faxed to facility and family agreeable with plan.  CS[facility]  Clinical Social Worker will sign off for now as social work intervention is no longer needed. Please consult us again if new need arises.  Octavio GravesIsabel Andrik Sandt, MSW, [facility] Clinical Social Worker [phone]

## 2018-08-14 NOTE — Progress Notes (Signed)
[facility]) RN Note  Received request from [place], [place] for family interest in Deborah [name] with request for transfer today. Chart reviewed. Spoke with son Deborah [name] via phone and met with daughter Deborah [name] to confirm interest and explain services. Family agreeable to transfer today. CSW aware. Registration paper work completed with Daughter Deborah [name]. Dr. Orpah Melter to assume care per family request.   Please fax discharge summary to [phone].  RN please call report to [phone].   Please arrange transport for patient to arrive as soon possible.  Thank you.  Gar Ponto, RN [facility] Liaison  [place] are on AMION

## 2018-08-14 NOTE — Clinical Social Work Note (Signed)
[facility] Palliative [facility]. [facility]   P[facility]er[facility]ridget Elleni Mozingo, [place] [phone]

## 2018-08-14 NOTE — Discharge Summary (Addendum)
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 with PMH of advanced dementia, poorly responsive and contracted at baseline, prior CVA, COPD, bipolar, HTN, HLD ; admitted on 08/06/2018, presented with complaint of unresponsiveness, was found to have sepsis due to UTI.  Hospital Course:   Severe sepsis secondary to UTI  -Urine culture grew pansensitive E. coli  -Blood cultures are negative -Was on IV ceftriaxone initially this was transitioned to oral antibiotics 12/10 -She had completed 7-day course of antibiotics  Toxic metabolic encephalopathy -Worsened in the setting of sepsis -Has advanced dementia at baseline  Advanced dementia -Poor prognosis, with dysphagia -Seen by speech therapy earlier this admission started on pured diet (dysphagia 1) -Through this hospital course has been minimally responsive, oral intake has continued to decline  -Followed by [facility] care of [facility] -Had a palliative care meeting for goals of care, due to worsening dementia, minimal responsiveness, worsening dysphagia and no p.o. intake -Decision made for comfort based care, she will be discharged to  residential hospice  Acute kidney injury -Resolved with hydration  Hypertension -Now comfort care  COPD/respiratory failure -Home O2 at baseline -Now comfort care  Depression anxiety/bipolar disorder -Depakote, lorazepam, Seroquel held due to unresponsiveness  History of hemorrhagic stroke   Discharge Exam: Vitals:   08/14/18 0733 08/14/18 0832  BP: 117/72   Pulse: 94   Resp:    Temp: 100 F (37.8 C)   SpO2: 93% 92%    General: Obtunded minimally responsive Cardiovascular: S1S2/tachycardic Respiratory: basilar ronchi  Discharge Instructions    Allergies as of 08/14/2018      Reactions   Bee Venom Anaphylaxis   Dicyclomine Hcl Anaphylaxis   Omeprazole Other (See Comments)    GI upset.   Other Other (See Comments)   Reaction to reductase inhibitors per [facility]   Statins Other (See Comments)   Myalgias      Medication List    STOP taking these medications   allopurinol 100 MG tablet Commonly known as:  ZYLOPRIM   BAZA PROTECT EX   cephALEXin 500 MG capsule Commonly known as:  KEFLEX   COMBIVENT RESPIMAT 20-100 MCG/ACT Aers respimat Generic drug:  Ipratropium-Albuterol   divalproex 125 MG capsule Commonly known as:  DEPAKOTE SPRINKLE   DULoxetine 60 MG capsule Commonly known as:  CYMBALTA   FLOVENT HFA 44 MCG/ACT inhaler Generic drug:  fluticasone   losartan 100 MG tablet Commonly known as:  COZAAR   metoprolol tartrate 50 MG tablet Commonly known as:  LOPRESSOR   multivitamin-prenatal 27-0.8 MG Tabs tablet   pantoprazole 40 MG tablet Commonly known as:  PROTONIX   promethazine 25 MG tablet Commonly known as:  PHENERGAN   VENTOLIN HFA 108 (90 Base) MCG/ACT inhaler Generic drug:  albuterol  TAKE these medications   acetaminophen 325 MG tablet Commonly known as:  TYLENOL Take 2 tablets (650 mg total) by mouth every 6 (six) hours as needed for mild pain (or Fever >/= 101).   LORazepam 2 MG/ML concentrated solution Commonly  known as:  ATIVAN Take 0.5 mLs (1 mg total) by mouth every 4 (four) hours as needed for anxiety.   morphine 20 MG/5ML solution Take 1.3 mLs (5.2 mg total) by mouth every 3 (three) hours as needed for pain (air hunger).   OXYGEN Inhale 2 L/min into the lungs as needed (o2).      Allergies  Allergen Reactions  . Bee Venom Anaphylaxis  . Dicyclomine Hcl Anaphylaxis  . Omeprazole Other (See Comments)     GI upset.  . Other Other (See Comments)    Reaction to reductase inhibitors per [facility]  . Statins Other (See Comments)    Myalgias    Follow-up Information    [facility] Follow up.   Why:  [facility] will follow patient at [facility] Contact information: [address] [facility] [place] [id number]           The results of significant diagnostics from this hospitalization (including imaging, microbiology, ancillary and laboratory) are listed below for reference.    Significant Diagnostic Studies: Ct Head Wo Contrast  Result Date: 08/06/2018 CLINICAL DATA:  Altered level of consciousness EXAM: CT HEAD WITHOUT CONTRAST TECHNIQUE: Contiguous axial images were obtained from the base of the skull through the vertex without intravenous contrast. COMPARISON:  01/31/2016 FINDINGS: Brain: Chronic atrophic changes as well as chronic white matter ischemic changes are seen. No findings to suggest acute hemorrhage, acute infarction or space-occupying mass lesion are noted. Stable encephalomalacia near the vertex is noted on the right. Vascular: No hyperdense vessel or unexpected calcification. Skull: Normal. Negative for fracture or focal lesion. Sinuses/Orbits: No acute finding. Stable chronic opacification of the right maxillary antrum is seen. Other: None. IMPRESSION: Chronic atrophic and ischemic changes without acute intracranial abnormality. Chronic opacification of the right maxillary antrum. Electronically Signed   By: Alcide CleverMark  Lukens M.D.   On:  08/06/2018 13:35   Dg Chest Portable 1 View  Result Date: 08/06/2018 CLINICAL DATA:  Altered mental status beginning yesterday. EXAM: PORTABLE CHEST 1 VIEW COMPARISON:  Single-view of the chest 12/05/2016 and 01/31/2016. FINDINGS: Lungs are clear. Heart size is upper normal. No pneumothorax or pleural fluid. No acute or focal bony abnormality. IMPRESSION: No acute disease. Electronically Signed   By: Drusilla Kannerhomas  Dalessio M.D.   On: 08/06/2018 12:32    Microbiology: Recent Results (from the past 240 hour(s))  Urine culture     Status: Abnormal   Collection Time: 08/06/18 11:29 AM  Result Value Ref Range Status   Specimen Description URINE, RANDOM  Final   Special Requests   Final    NONE Performed at [facility], [address] [place], [place] [address]    Culture >=100,000 COLONIES/mL ESCHERICHIA COLI (A)  Final   Report Status 08/08/2018 FINAL  Final   Organism ID, Bacteria ESCHERICHIA COLI (A)  Final      Susceptibility   Escherichia coli - MIC*    AMPICILLIN <=2 SENSITIVE Sensitive     CEFAZOLIN <=4 SENSITIVE Sensitive     CEFTRIAXONE <=1 SENSITIVE Sensitive     CIPROFLOXACIN <=0.25 SENSITIVE Sensitive     GENTAMICIN <=1 SENSITIVE Sensitive     IMIPENEM <=0.25 SENSITIVE Sensitive  NITROFURANTOIN <=16 SENSITIVE Sensitive     TRIMETH/SULFA <=20 SENSITIVE Sensitive     AMPICILLIN/SULBACTAM <=2 SENSITIVE Sensitive     PIP/TAZO <=4 SENSITIVE Sensitive     Extended ESBL NEGATIVE Sensitive     * >=100,000 COLONIES/mL ESCHERICHIA COLI  Blood Culture (routine x 2)     Status: None   Collection Time: 08/06/18 12:00 PM  Result Value Ref Range Status   Specimen Description BLOOD BLOOD LEFT FOREARM  Final   Special Requests   Final    BOTTLES DRAWN AEROBIC AND ANAEROBIC Blood Culture adequate volume   Culture   Final    NO GROWTH 5 DAYS Performed at [facility], [address], [place], [place] [address]    Report Status 08/11/2018 FINAL  Final  Blood Culture  (routine x 2)     Status: None   Collection Time: 08/06/18  1:10 PM  Result Value Ref Range Status   Specimen Description BLOOD RIGHT ARM  Final   Special Requests   Final    BOTTLES DRAWN AEROBIC ONLY Blood Culture results may not be optimal due to an inadequate volume of blood received in culture bottles   Culture   Final    NO GROWTH 5 DAYS Performed at [facility], [address] [place], [place] [address]    Report Status 08/11/2018 FINAL  Final  MRSA PCR Screening     Status: None   Collection Time: 08/10/18 10:13 PM  Result Value Ref Range Status   MRSA by PCR NEGATIVE NEGATIVE Final    Comment:        The GeneXpert MRSA Assay (FDA approved for NASAL specimens only), is one component of a comprehensive MRSA colonization surveillance program. It is not intended to diagnose MRSA infection nor to guide or monitor treatment for MRSA infections. Performed at [facility], [address], [place], [place] [address]      Labs: Basic Metabolic Panel: Recent Labs  Lab 08/09/18 9147 08/10/18 1127 08/11/18 1058 08/12/18 0259 08/13/18 0321 08/14/18 0322  NA 149* 143 144 144 146* 146*  K 3.8 3.2* 3.0* 3.6 3.6 3.6  CL 113* 105 106 106 111 108  CO2 29 29 27 28 25 28   GLUCOSE 116* 111* 92 104* 125* 127*  BUN 11 6* 7* 8 10 9   CREATININE 0.66 0.70 0.58 0.83 0.61 0.76  CALCIUM 8.1* 8.0* 8.4* 8.3* 8.1* 8.2*  MG 1.8  --  1.8  --   --   --    Liver Function Tests: Recent Labs  Lab 08/09/18 0923  AST 35  ALT 31  ALKPHOS 33*  BILITOT 0.7  PROT 4.6*  ALBUMIN 2.1*   No results for input(s): LIPASE, AMYLASE in the last 168 hours. No results for input(s): AMMONIA in the last 168 hours. CBC: Recent Labs  Lab 08/09/18 0923 08/12/18 0259 08/13/18 0321 08/14/18 0322  WBC 6.8 8.3 8.7 9.8  NEUTROABS 3.7  --   --   --   HGB 12.8 13.2 13.3 13.8  HCT 41.2 41.6 42.4 42.9  MCV 94.5 89.8 91.2 90.5  PLT 205 297 319 306   Cardiac Enzymes: No results for  input(s): CKTOTAL, CKMB, CKMBINDEX, TROPONINI in the last 168 hours. BNP: BNP (last 3 results) No results for input(s): BNP in the last 8760 hours.  ProBNP (last 3 results) No results for input(s): PROBNP in the last 8760 hours.  CBG: No results for input(s): GLUCAP in the last 168  hours.     Signed:  Zannie Cove MD.  [facility] 08/14/2018, 11:40 AM

## 2018-08-14 NOTE — Progress Notes (Signed)
Report given to [facility], all questions were answered. Family updated.

## 2018-08-14 NOTE — Progress Notes (Signed)
Daily Progress Note   Patient Name: Deborah [name]       Date: 08/14/2018 DOB: 02-13-1946  Age: 72 y.o. MRN#: [id number] Attending Physician: Zannie Cove, MD Primary Care Physician: Agapito Games, MD Admit Date: 08/06/2018  Reason for Consultation/Follow-up: Establishing goals of care and Hospice Evaluation  Subjective: Patient not interactive - does not respond to voice or sternal rub this am, per nursing NO PO intake - no family at bedside  Length of Stay: 8  Current Medications: Scheduled Meds:  . budesonide  0.25 mg Nebulization BID  . chlorhexidine  15 mL Mouth Rinse BID  . enoxaparin (LOVENOX) injection  40 mg Subcutaneous Q24H  . feeding supplement (ENSURE ENLIVE)  237 mL Oral BID BM  . mouth rinse  15 mL Mouth Rinse q12n4p    Continuous Infusions:   PRN Meds: acetaminophen **OR** acetaminophen, albuterol  Physical Exam Constitutional:      General: She is not in acute distress.    Comments: Not interactive, lying in fetal position  HENT:     Head: Normocephalic and atraumatic.  Cardiovascular:     Rate and Rhythm: Normal rate and regular rhythm.  Pulmonary:     Effort: Pulmonary effort is normal.  Abdominal:     Palpations: Abdomen is soft.  Neurological:     Mental Status: She is unresponsive.             Vital Signs: BP 117/72   Pulse 94   Temp 100 F (37.8 C) (Oral)   Resp (!) 22   Ht 5\' 4"  (1.626 m)   Wt 64.7 kg   SpO2 92%   BMI 24.48 kg/m  SpO2: SpO2: 92 % O2 Device: O2 Device: Room Air O2 Flow Rate: O2 Flow Rate (L/min): 2 L/min  Intake/output summary:   Intake/Output Summary (Last 24 hours) at 08/14/2018 1046 Last data filed at 08/14/2018 0500 Gross per 24 hour  Intake 60 ml  Output 225 ml  Net -165 ml   LBM: Last BM Date:  08/13/18 Baseline Weight: Weight: 77.1 kg Most recent weight: Weight: 64.7 kg       Palliative Assessment/Data: PPS 10%    Flowsheet Rows     Most Recent Value  Intake Tab  Referral Department  Hospitalist  Unit at Time of Referral  Intermediate Care Unit  Palliative Care Primary Diagnosis  Other (Comment) [on-set dementia]  Date Notified  08/12/18  Palliative Care Type  New Palliative care  Reason for referral  Clarify Goals of Care  Date of Admission  08/06/18  Date first seen by Palliative Care  08/12/18  # of days Palliative referral response time  0 Day(s)  # of days IP prior to Palliative referral  6  Clinical Assessment  Palliative Performance Scale Score  20%  Psychosocial & Spiritual Assessment  Palliative Care Outcomes  Patient/Family meeting held?  Yes  Who was at the meeting?  son Deborah Hua  Palliative Care Outcomes  Clarified goals of care, Counseled regarding hospice, Transitioned to hospice      Patient Active Problem List   Diagnosis Date Noted  . Goals of care, counseling/discussion   . Palliative care encounter   . Dehydration 08/06/2018  .  Severe sepsis (HCC) 08/06/2018  . Sepsis (HCC) 08/06/2018  . Acute blood loss anemia 12/05/2016  . Hypertension   . Hyperlipidemia   . Dementia (HCC)   . COPD (chronic obstructive pulmonary disease) (HCC)   . Acute encephalopathy   . Hypokalemia   . HCAP (healthcare-associated pneumonia) 03/31/2015  . HLD (hyperlipidemia) 05/18/2014  . Stroke due to intracerebral hemorrhage (HCC) 05/14/2014  . Stroke (HCC) 05/14/2014  . Stress incontinence 04/20/2013  . Moderate dementia (HCC) 04/20/2013  . PLMD (periodic limb movement disorder) 03/01/2013  . Hypoxia, sleep related 07/05/2011  . ACQUIRED HEMOLYTIC ANEMIA UNSPECIFIED 08/08/2010  . HYPERGLYCEMIA 06/01/2010  . HYPONATREMIA 04/11/2010  . HYPERLIPIDEMIA 03/24/2010  . PULMONARY NODULE 11/24/2009  . FATTY LIVER DISEASE 03/01/2009  . HEPATOMEGALY 03/01/2009  .  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 ASTHMA 09/17/2008  . 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 HYPERCHOLESTEROLEMIA 10/28/2007  . Bipolar disorder (HCC) 10/28/2007  . Essential hypertension 10/28/2007  . CARDIOMEGALY 10/28/2007  . ALLERGIC RHINITIS CAUSE UNSPECIFIED 10/28/2007  . COPD 10/28/2007  . HIATAL HERNIA 10/28/2007  . DIVERTICULAR DISEASE 10/28/2007  . Irritable bowel syndrome 10/28/2007  . HEPATIC CYST 10/28/2007  . DEGENERATIVE JOINT DISEASE 10/28/2007  . HEADACHE, CHRONIC 10/28/2007  . ARRHYTHMIA, HX OF 10/28/2007    Palliative Care Assessment & Plan   HPI: 72 y.o. female  with past medical history of early on-set dementia, NASH, COPD, CVA, Bipolar DO who was admitted on 08/06/2018 with severe sepsis from UTI.  She is from ALF memory care.  She has been on Hospice in the past but graduated off and is now [facility]. She has received 6 days of hospital therapy and antibiotics.  She is stable but barely eating/drinking or speaking.  Not interactive. PMT consulted for GOC/hospice evaluation.  Assessment: Follow up with patient and son again today. Patient is not interactive/not accepting PO intake. Spoke with son, Deborah HuaDavid, via telephone regarding patient's status. Per nursing staff, NO PO intake. Does not accept PO intake on attempts by nursing staff. Shared this with Deborah Huaavid - the patient's daughter was at bedside yesterday evening and shared with Deborah HuaDavid that the patient was unresponsive/not eating. Daughter shared with Deborah HuaDavid that patient appeared to be dying. Deborah HuaDavid tells me that he and patient's daughters have all agreed that hospice home is most appropriate. He has good understanding of poor prognosis and understands she is approaching EOL. We discussed hospice philosophy. He agrees that focusing on her comfort and dignity is most appropriate at this point.   Recommendations/Plan:  Elevate comfort above aggressive measures  Family agrees to hospice home  [facility] - discussed with social work and MD  Code Status:  DNR  Prognosis:   < 2 weeks d/t no PO intake  Discharge Planning:  [facility]  Care plan was discussed with son, Deborah HuaDavid and Dr. Jomarie LongsJoseph, RN, and social work  Thank you for allowing the Palliative Medicine Team to assist in the care of this patient.   Total Time 25 minutes Prolonged Time Billed  no       Greater than 50%  of this time was spent counseling and coordinating care related to the above assessment and plan.  Gerlean RenShae Lee Harles Evetts, DNP, [facility] Palliative Medicine Team Team Phone # [phone]  Pager [phone]

## 2018-08-18 ENCOUNTER — Encounter: Payer: Medicare Other | Admitting: Nurse Practitioner

## 2018-09-02 DEATH — deceased

## 2021-02-14 ENCOUNTER — Encounter: Payer: Self-pay | Admitting: Internal Medicine

## 2021-03-08 ENCOUNTER — Encounter: Payer: Self-pay | Admitting: Internal Medicine
